# Patient Record
Sex: Female | Born: 1945 | Race: White | Hispanic: No | Smoking: Never smoker
Health system: Southern US, Community
[De-identification: ages and names within clinical notes are randomized; demographics above are authoritative.]

## PROBLEM LIST (undated history)

## (undated) DIAGNOSIS — E119 Type 2 diabetes mellitus without complications: Secondary | ICD-10-CM

## (undated) DIAGNOSIS — F32A Depression, unspecified: Secondary | ICD-10-CM

## (undated) DIAGNOSIS — E7800 Pure hypercholesterolemia, unspecified: Secondary | ICD-10-CM

## (undated) DIAGNOSIS — M109 Gout, unspecified: Secondary | ICD-10-CM

## (undated) DIAGNOSIS — I10 Essential (primary) hypertension: Secondary | ICD-10-CM

## (undated) DIAGNOSIS — F329 Major depressive disorder, single episode, unspecified: Secondary | ICD-10-CM

## (undated) HISTORY — PX: CHOLECYSTECTOMY: SHX55

## (undated) HISTORY — PX: REPLACEMENT TOTAL KNEE: SUR1224

## (undated) HISTORY — PX: CARPAL TUNNEL RELEASE: SHX101

---

## 1999-05-20 ENCOUNTER — Ambulatory Visit (HOSPITAL_BASED_OUTPATIENT_CLINIC_OR_DEPARTMENT_OTHER): Admission: RE | Admit: 1999-05-20 | Discharge: 1999-05-20 | Payer: Self-pay | Admitting: Orthopedic Surgery

## 2000-09-09 ENCOUNTER — Other Ambulatory Visit: Admission: RE | Admit: 2000-09-09 | Discharge: 2000-09-09 | Payer: Self-pay | Admitting: Internal Medicine

## 2001-09-27 ENCOUNTER — Encounter: Payer: Self-pay | Admitting: Internal Medicine

## 2001-09-27 ENCOUNTER — Ambulatory Visit (HOSPITAL_COMMUNITY): Admission: RE | Admit: 2001-09-27 | Discharge: 2001-09-27 | Payer: Self-pay | Admitting: Internal Medicine

## 2002-01-11 ENCOUNTER — Emergency Department (HOSPITAL_COMMUNITY): Admission: EM | Admit: 2002-01-11 | Discharge: 2002-01-11 | Payer: Self-pay | Admitting: *Deleted

## 2002-01-11 ENCOUNTER — Encounter: Payer: Self-pay | Admitting: *Deleted

## 2002-09-06 ENCOUNTER — Encounter: Payer: Self-pay | Admitting: Orthopedic Surgery

## 2002-09-10 ENCOUNTER — Inpatient Hospital Stay (HOSPITAL_COMMUNITY): Admission: RE | Admit: 2002-09-10 | Discharge: 2002-09-14 | Payer: Self-pay | Admitting: Orthopedic Surgery

## 2003-06-24 ENCOUNTER — Ambulatory Visit (HOSPITAL_COMMUNITY): Admission: RE | Admit: 2003-06-24 | Discharge: 2003-06-24 | Payer: Self-pay | Admitting: Internal Medicine

## 2003-12-12 ENCOUNTER — Emergency Department (HOSPITAL_COMMUNITY): Admission: EM | Admit: 2003-12-12 | Discharge: 2003-12-13 | Payer: Self-pay | Admitting: Emergency Medicine

## 2004-12-18 ENCOUNTER — Ambulatory Visit (HOSPITAL_COMMUNITY): Admission: RE | Admit: 2004-12-18 | Discharge: 2004-12-18 | Payer: Self-pay | Admitting: *Deleted

## 2005-07-15 ENCOUNTER — Ambulatory Visit: Payer: Self-pay | Admitting: Internal Medicine

## 2005-07-26 ENCOUNTER — Ambulatory Visit: Payer: Self-pay | Admitting: Internal Medicine

## 2005-07-26 ENCOUNTER — Ambulatory Visit (HOSPITAL_COMMUNITY): Admission: RE | Admit: 2005-07-26 | Discharge: 2005-07-26 | Payer: Self-pay | Admitting: Internal Medicine

## 2005-07-31 ENCOUNTER — Emergency Department (HOSPITAL_COMMUNITY): Admission: EM | Admit: 2005-07-31 | Discharge: 2005-07-31 | Payer: Self-pay | Admitting: Emergency Medicine

## 2005-08-06 ENCOUNTER — Ambulatory Visit (HOSPITAL_COMMUNITY): Admission: RE | Admit: 2005-08-06 | Discharge: 2005-08-06 | Payer: Self-pay | Admitting: Internal Medicine

## 2005-08-30 ENCOUNTER — Ambulatory Visit: Payer: Self-pay | Admitting: Internal Medicine

## 2005-09-14 ENCOUNTER — Ambulatory Visit: Payer: Self-pay | Admitting: Internal Medicine

## 2005-09-29 ENCOUNTER — Encounter (INDEPENDENT_AMBULATORY_CARE_PROVIDER_SITE_OTHER): Payer: Self-pay | Admitting: Specialist

## 2005-09-29 ENCOUNTER — Ambulatory Visit (HOSPITAL_COMMUNITY): Admission: RE | Admit: 2005-09-29 | Discharge: 2005-09-29 | Payer: Self-pay | Admitting: Internal Medicine

## 2005-09-29 ENCOUNTER — Ambulatory Visit: Payer: Self-pay | Admitting: Internal Medicine

## 2005-10-18 ENCOUNTER — Ambulatory Visit (HOSPITAL_COMMUNITY): Admission: RE | Admit: 2005-10-18 | Discharge: 2005-10-18 | Payer: Self-pay | Admitting: Internal Medicine

## 2005-12-21 ENCOUNTER — Ambulatory Visit (HOSPITAL_COMMUNITY): Admission: RE | Admit: 2005-12-21 | Discharge: 2005-12-21 | Payer: Self-pay | Admitting: *Deleted

## 2006-08-29 ENCOUNTER — Ambulatory Visit (HOSPITAL_COMMUNITY): Admission: RE | Admit: 2006-08-29 | Discharge: 2006-08-29 | Payer: Self-pay | Admitting: Internal Medicine

## 2007-05-10 ENCOUNTER — Inpatient Hospital Stay (HOSPITAL_COMMUNITY): Admission: RE | Admit: 2007-05-10 | Discharge: 2007-05-14 | Payer: Self-pay | Admitting: Orthopedic Surgery

## 2007-06-05 ENCOUNTER — Encounter (HOSPITAL_COMMUNITY): Admission: RE | Admit: 2007-06-05 | Discharge: 2007-07-05 | Payer: Self-pay | Admitting: Orthopedic Surgery

## 2007-07-06 ENCOUNTER — Encounter (HOSPITAL_COMMUNITY): Admission: RE | Admit: 2007-07-06 | Discharge: 2007-08-05 | Payer: Self-pay | Admitting: Orthopedic Surgery

## 2007-07-09 ENCOUNTER — Emergency Department (HOSPITAL_COMMUNITY): Admission: EM | Admit: 2007-07-09 | Discharge: 2007-07-09 | Payer: Self-pay | Admitting: Emergency Medicine

## 2007-07-10 ENCOUNTER — Other Ambulatory Visit: Payer: Self-pay | Admitting: Orthopedic Surgery

## 2007-07-11 ENCOUNTER — Inpatient Hospital Stay (HOSPITAL_COMMUNITY): Admission: RE | Admit: 2007-07-11 | Discharge: 2007-07-14 | Payer: Self-pay | Admitting: Orthopedic Surgery

## 2008-05-20 ENCOUNTER — Ambulatory Visit (HOSPITAL_COMMUNITY): Admission: RE | Admit: 2008-05-20 | Discharge: 2008-05-20 | Payer: Self-pay | Admitting: Internal Medicine

## 2008-06-17 ENCOUNTER — Ambulatory Visit: Payer: Self-pay | Admitting: Gastroenterology

## 2008-06-18 ENCOUNTER — Encounter: Payer: Self-pay | Admitting: Gastroenterology

## 2008-06-28 ENCOUNTER — Ambulatory Visit: Payer: Self-pay | Admitting: Internal Medicine

## 2008-06-28 ENCOUNTER — Ambulatory Visit (HOSPITAL_COMMUNITY): Admission: RE | Admit: 2008-06-28 | Discharge: 2008-06-28 | Payer: Self-pay | Admitting: Internal Medicine

## 2008-06-28 ENCOUNTER — Encounter: Payer: Self-pay | Admitting: Internal Medicine

## 2008-07-04 ENCOUNTER — Telehealth (INDEPENDENT_AMBULATORY_CARE_PROVIDER_SITE_OTHER): Payer: Self-pay

## 2008-07-04 ENCOUNTER — Ambulatory Visit (HOSPITAL_COMMUNITY): Admission: RE | Admit: 2008-07-04 | Discharge: 2008-07-04 | Payer: Self-pay | Admitting: Internal Medicine

## 2008-07-05 ENCOUNTER — Encounter: Payer: Self-pay | Admitting: Internal Medicine

## 2008-07-09 ENCOUNTER — Encounter: Payer: Self-pay | Admitting: Urgent Care

## 2008-07-11 ENCOUNTER — Encounter: Payer: Self-pay | Admitting: Internal Medicine

## 2008-07-11 ENCOUNTER — Encounter (INDEPENDENT_AMBULATORY_CARE_PROVIDER_SITE_OTHER): Payer: Self-pay | Admitting: *Deleted

## 2008-07-23 ENCOUNTER — Encounter: Payer: Self-pay | Admitting: Gastroenterology

## 2008-08-29 ENCOUNTER — Telehealth (INDEPENDENT_AMBULATORY_CARE_PROVIDER_SITE_OTHER): Payer: Self-pay

## 2009-04-07 ENCOUNTER — Encounter: Payer: Self-pay | Admitting: Gastroenterology

## 2010-05-17 ENCOUNTER — Encounter: Payer: Self-pay | Admitting: Internal Medicine

## 2010-08-06 LAB — CREATININE, SERUM
Creatinine, Ser: 1 mg/dL (ref 0.4–1.2)
GFR calc Af Amer: >60 mL/min (ref >60)
GFR calc non Af Amer: 56 mL/min — ABNORMAL LOW (ref >60)

## 2010-08-11 ENCOUNTER — Encounter: Payer: Self-pay | Admitting: Internal Medicine

## 2010-09-08 NOTE — Op Note (Signed)
Alexandra [name], Alexandra [name]               ACCOUNT NO.:  [id number]   MEDICAL RECORD NO.:  [id number]          PATIENT TYPE:  AMB   LOCATION:  DAY                           FACILITY:  [facility]   PHYSICIAN:  R. Roetta Sessions, M.D. DATE OF BIRTH:  Jan 31, 1946   DATE OF PROCEDURE:  06/28/2008  DATE OF DISCHARGE:                                PROCEDURE NOTE   PROCEDURE PERFORMED:  Esophagogastroduodenoscopy, biopsy.   INDICATIONS FOR PROCEDURE:  This 65 year-old lady with chronic  hoarseness and cough who has not had an ENT evaluation, recently  underwent a barium pill esophagram ordered by Dr. Sherwood Gambler which revealed  some extrinsic compression on the posterior aspect of the cervix  esophageal level of C4, C5- C6, C6-C7 and some asymmetrical appearance  of the impression.  On the upper area of the cervical esophagus at the  level of the left piriform sinus, radiology could not rule out mass  effect or some other process.  [name] denies any dysphagia  whatsoever.  EGD is now being done to evaluate the mucosal side of her  upper GI tract.  Risks, benefits and alternatives with the patient has  been reviewed and questions answered. See documentation in the medical  record under procedure note.   O2 saturation, blood pressure, pulse, respirations were monitored  throughout the entire procedure.   CONSCIOUS SEDATION:  Versed 5 mg IV, Demerol 100 mg IV in divided doses.   INSTRUMENT:  Pentax video chip system.   FINDINGS:  Examination of the hypopharynx revealed normal appearing  piriformis sinuses.  I did not see any mass or other abnormality.  Upper  esophageal sphincter was easily intubated.  There was some extrinsic  compression of proximal esophagus.  Mucosa however appeared normal and  this was a nonobstructing finding.  The remainder of the esophageal  mucosa down through EG junction appeared normal.   Stomach, colon, gastric cavity:  Appeared empty and inflated well with  air.  A thorough  examination of gastric mucosa including retroflexion of  proximal stomach, esophagogastric junction demonstrated multiple 1 to 2  mm tiny gastric nodules.  There was one larger one approximately 9 mm in  dimension  Somewhere that seen on prior EGD; please see photos.  This  appeared to be a benign finding.  Remainder of gastric mucosa appeared  unremarkable.  Pylorus patent.  Traversed duodenal bulb and second  portion revealed no abnormalities.   THERAPY/ DIAGNOSTIC MANEUVERS PERFORMED:  The large nodule along the  lesser curvature, the antrum was biopsied for histologic study.   The patient tolerated the procedure well and was reactive on endoscopy.   IMPRESSION:  Extrinsic compression on cervical esophagus consistent with  barium pill esophagram findings of doubtful clinical significance.  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  Remainder of stomach  appeared normal.  Normal D1 and D2.   RECOMMENDATIONS:  [name] hoarseness and cough are more likely  primary ENT/ pulmonary problem than any gastrointestinal process.  Will  go ahead and do CT scan  of neck and upper chest to rule out any other  process beyond osteophytes producing a benign impression on the cervical  esophagus.  Depending on what is found, I feel it would be a good idea  for this nice lady to go ahead and get a otolaryngological consultation  in the near future.      Jonathon Bellows, M.D.  Electronically Signed     RMR/MEDQ  D:  06/28/2008  T:  06/28/2008  Job:  [id number]   cc:   Madelin Rear. Sherwood Gambler, MD  Fax: [phone]

## 2010-09-08 NOTE — Op Note (Signed)
Alexandra [name], Alexandra [name]               ACCOUNT NO.:  [id number]   MEDICAL RECORD NO.:  [id number]          PATIENT TYPE:  INP   LOCATION:  5016                         FACILITY:  [facility]   PHYSICIAN:  Harvie Junior, M.D.   DATE OF BIRTH:  [birth year]   DATE OF PROCEDURE:  05/10/2007  DATE OF DISCHARGE:  05/14/2007                               OPERATIVE REPORT   PREOPERATIVE DIAGNOSIS:  End-stage degenerative joint disease of the  left knee.   PROCEDURES:  1. Left total knee replacement with Sigma system, size 3 femur, size 3      tibia, 15-mm bridging bearing and a 35-mm all-polyethylene patella.  2. Computer-assisted left total knee replacement.   SURGEON:  Harvie Junior, M.D.   ASSISTANT:  Marshia Ly, P.A.   ANESTHESIA:  General.   BRIEF HISTORY:  Alexandra [name] is a 65 year old female with a long  history of having had severe end-stage degenerative joint disease of the  left knee.  We treated her conservatively for a long period time.  We  treated anti-inflammatory medication and activity modification and  because of failure of all these, she is brought to the operating room  for a left total knee replacement.  Given her young age and large size,  I felt that it is appropriate to use computer assistance trying to get  as much longevity for her implant as we could, and this was chosen to be  used preoperatively and was brought to the operating room with the  patient.   PROCEDURE:  The patient was brought to the operating room and after  adequate anesthesia was obtained with a general anesthetic, the patient  was placed supine on the operating table and the left leg was prepped  and draped in the usual sterile fashion.  Following this a midline  incision was made through subcutaneous tissues and taken down to the  level of the extensor mechanism.  A medial parapatellar arthrotomy was  undertaken and attention was then turned towards the knee, where an  anterior and posterior  cruciates were excised as well as medial and  lateral meniscus and retropatellar fat pad.  At this point the computer-  assisted module put in place, two pins in the tibia, two pins in the  femur, and the registration process was then undertaken and adds about  20 minutes to the surgical procedure.  At this point the tibia was cut  perpendicular to the long axis, the femur cut perpendicular to the  anatomic axis and then spacer blocks were used to assess the gap.  At  this point attention was turned towards the sizing of the femur, which  was sized with the anterior referencing sizing guide and ultimately the  size 3 was chosen as the appropriate size and the rotational guide was  then used with a combination of computer assistance and rotation via the  anterior referencing guide.  Once this was completed, a size 3 femur was  chosen and the anterior and posterior cuts were made as well as the  chamfers.  Attention was turned the tibia, which  was sized and drilled  and keeled with a size 3.  Once this was completed, a 15-mm bridging  bearing was put in place and excellent full extension with the bearing.  Once this was completed, attention was turned to the patella.  It sized  to a 35 and after the patella was cut with a patellar sizing guide, then  the attention was turned towards putting in this patella and once this  was chosen, a 35 was chosen and lugs were drilled for the patella.  Lugs  was drilled for the femur and all the trial components were removed.  The knee was copiously and thoroughly lavaged and suctioned dry.  The  final components were then cemented into place, size 3 femur, size  3tibia, a 15-mm bridging bearing was used as a trial and a 35 mm patella  was put in place.  Once this was completed, the cement was allowed to  harden.  We checked our long alignment with the computer, perfect  neutral long alignment, gap balances were perfect.  At this point once  the cement  hardened, the trial poly was removed, the tourniquet let  down, all bleeders were controlled with electrocautery.  The final was  put in place after all excess bone cement was removed with a tool and  the knee was again irrigated and suctioned dry.  A medium Hemovac drain  was placed and the medial parapatellar arthrotomy was closed with a #1  Vicryl running.  __________ , where she was noted to be in satisfactory  condition.  Estimated blood loss for the procedure was less than 100 mL.      Harvie Junior, M.D.  Electronically Signed     JLG/MEDQ  D:  07/05/2007  T:  07/06/2007  Job:  [id number]

## 2010-09-08 NOTE — Op Note (Signed)
Alexandra [name], Alexandra [name]               ACCOUNT NO.:  [id number]   MEDICAL RECORD NO.:  [id number]          PATIENT TYPE:  OIB   LOCATION:  2550                         FACILITY:  [facility]   PHYSICIAN:  Feliberto Gottron. Turner Daniels, M.D.   DATE OF BIRTH:  [birth year]   DATE OF PROCEDURE:  07/11/2007  DATE OF DISCHARGE:                               OPERATIVE REPORT   PREOPERATIVE DIAGNOSIS:  Left total knee inferior pole patella fracture,  patella implant stable.   POSTOPERATIVE DIAGNOSIS:  Left total knee inferior pole patella  fracture, patella implant stable.   PROCEDURE:  Open reduction internal fixation of left total knee inferior  pole patella fracture using two #5 FiberWire core sutures with a running  whip stitch going through the patellar tendon up through 4 parallel  drill holes through the patella and then a running tied over the patella  and then a running whip stitch into the quad tendon.  Again, there were  4 core sutures.   SURGEON:  Feliberto Gottron.  Turner Daniels, MD.   ASSISTANT:  Marshia Ly, PA-C.   ANESTHETIC:  Endotracheal.   ESTIMATED BLOOD LOSS:  100 mL.   FLUID REPLACEMENT:  A liter of crystalloid.   DRAINS PLACED:  Foley catheter and two medium Hemovacs.   URINE OUTPUT:  Was about 800 mL.   INDICATIONS FOR PROCEDURE:  A 65 year old woman who underwent a  successful cemented left total knee by Dr. Luiz Blare back in January 2009  slipped and fell in her yard and sustained a displaced inferior pole  patella fracture of her left total knee.  There is also evidence of  comminution in the inferior pole on the plain x-rays and there was a  concern whether this would be strong enough to handle the parallel  wires.  In addition, the patellar implant looked to be well-fixed to the  remainder of the patella.  She is taken for open reduction internal  fixation of the tendon avulsion and is aware that failure rates for this  procedure range anywhere from 15-50% based on studies that have been  done in the last 10 years but without surgery she will no quadriceps  function in the left leg, therefore,  the risks are probably justified.   DESCRIPTION OF PROCEDURE:  The patient identified by armband, taken to  the operating room at [facility].  The appropriate monitors were  attached and general endotracheal anesthesia induced with the patient in  the supine position.  She received 2 grams of Ancef preoperatively.  A  tourniquet was applied high to the left thigh and the left lower  extremity was prepped and draped in usual sterile fashion from the toes  to the tourniquet.  A time out was performed.  The limb was wrapped with  an Esmarch bandage, tourniquet inflated to 350 mmHg, and we utilized the  old total knee incision going from proximal and distal through the skin  and just into the subcutaneous tissue, where we immediately encountered  a pressurized hematoma.  This was drained and then we set about removing  the fascia from the  superior and inferior ends of the fracture.  The  superior fragment was 1 piece and had the patellar button in it and at  the very inferior pole the button is where the inferior pole broken had  off and portion was a broken into more than 5 or 5 pieces.  Because of  this turn of events, we elected to not to do parallel wires with a  tension band and elected to instead to do a whip stitch repair going up  and down the patellar tendon with two #5 FiberWire core sutures for 4  limbs.  We then drilled parallel holes just anterior to the patellar  button implant through the patella and brought the #5 FiberWires out  through the parallel vertical holes.  Each pair was then tied at the  exit and then run up into the quad tendon as a whip stitch and tied  again.  This gave [place] good firm fixation.  The knee could be flexed from  0 to about 60 degrees without any motion noticed at the fracture site.  We then repaired the retinaculum with a running #1  Vicryl suture going  horizontally from lateral to medial and placed a medium Hemovac drain  deep in the wound and another one superficially.  Prior to this, the  wounds were irrigated out with normal saline solution and pulse lavage.  The subcutaneous tissue was then closed with 0 and 2-0 undyed Vicryl  suture and the skin with skin staples.  A dressing of Xeroform, 4 x 4  dressing sponges, Webril, an Ace wrap and knee immobilizer was then  applied and the plan is to keep her in the knee immobilizer for 6-12  weeks, depending on what the postoperative radiographs look like in  series.  The tourniquet was let down.  The patient was then awakened and  taken to the recovery room without difficulty.      Feliberto Gottron. Turner Daniels, M.D.  Electronically Signed     FJR/MEDQ  D:  07/11/2007  T:  07/11/2007  Job:  [id number]   cc:   Harvie Junior, M.D.

## 2010-09-08 NOTE — Op Note (Signed)
[name], [name]               ACCOUNT NO.:  [id number]   MEDICAL RECORD NO.:  [id number]          PATIENT TYPE:  INP   LOCATION:  5016                         FACILITY:  [facility]   PHYSICIAN:  Harvie Junior, M.D.   DATE OF BIRTH:  [birth year]   DATE OF PROCEDURE:  05/10/2007  DATE OF DISCHARGE:                               OPERATIVE REPORT   PREOPERATIVE DIAGNOSIS:  End-stage degenerative joint disease left.   POSTOPERATIVE DIAGNOSIS:  End-stage degenerative joint disease left.   PRINCIPAL PROCEDURE:  1. Left total knee replacement with Sigma system size 3 femur, size 3      tibia, 12.5 mm bridging bearing, 35 mm all polyethylene patella.  2. Computer assisted left total knee replacement.   SURGEON:  Harvie Junior, M.D.   ASSISTANT:  Marshia Ly, P.A.   ANESTHESIA:  General.   HISTORY:  Alexandra [name] is a 65 year old female with a long history of  having had significant bilateral joint disease.  We had done her right  knee about 4 years ago.  She had tolerated for a while left knee pain.  We treated her conservatively with anti-inflammatory medication,  activity modification and injection therapy.  All this failed and  because of continuing complaints of pain she was brought to the  operating room for operative knee replacement.  On her right knee she  had significant postoperative pain and difficult time gaining flexion.  She had some pain postoperatively and it was felt that it was  appropriate to use computer assistance because of her large size, young  age and difficulty that she had had on the other side and so this was  chosen to be used preoperatively.   PROCEDURE IN DETAIL:  The patient was taken to the operating room.  After adequate anesthesia was obtained with general anesthetic the  patient was placed on the operating table.  The left leg was prepped and  draped in usual sterile fashion.  Following this a midline incision was  made and taken through  subcutaneous tissues and dissected down to the  level of the extensor mechanism.  Medial peripatellar arthrotomy was  undertaken.  Medial and lateral meniscectomy, anterior and posterior  cruciate excisions were performed at that point.  At this point 2 pins  were placed in the tibia, 2 pins in the femur and the computer  assistance module was set in place and the registration process then  undertaken and this added about 20 minutes to the surgical procedure.  Once this was completed the tibia was cut perpendicular to the long  axis.  Given her large flexion contracture preoperatively we did take 9  mm off the high side.  Once this was completed attention was turned to  the distal femur where we cut 10 mm off of the distal femur and this  gave excellent balance to the knee alignment with a 15 mm shim being put  in place.  At this point we put the posterior referencing sizing guide  in place and this gave access to a size 3 femur and once this was  completed  we felt that, well actually sized to a size 4 but we felt with  the 15 extension gap that we were going to need to downsize to a 3 to  provide enough flexion gap to match the extension gap.  At this point I  downsized the femur to a 3 which actually was probably a better fit than  a 4 in the medial lateral plane.  Once the holes were drilled for this  it was checked with the computer assistance module and then the anterior  and posterior cuts were made as well as the chamfers.  At this point the  box cut was made.   At this point attention was turned to the tibia which was sized to a 3.  It was pinned and then rotational alignment was obtained and then the  12.5 mm bridging bearing was put in place.  Easy full extension, good  gap balance, 90 degrees with a little bit of a drawer.  Tried a 15 and  that actually came out nicely into full extension.  The drawer was  relieved and really had perfect gap balancing at 90 degrees of  flexion,  had perfect gap balancing at full extension.  So we felt that this was  the appropriate choice.   Attention was turned to the patella which was cut down to a level of 13  and a 35 mm chosen.  Lugs were drilled.  Lugs were drilled for the  femur.  All trial components were removed.  The knee was copiously and  thoroughly irrigated with pulsatile lavage irrigation followed by final  components being cemented into place after drying the interstices of the  cement.  Once the size 3 was cemented in place a size 3 tibial component  and a 12.5 mm bridging bearing was used to allow the cement to dry and  then once this was completed the patella was put in and a patella clamp  was put in place.  Once the cement was allowed to harden a medium  Hemovac drain was put in place and the trial poly was removed.  The  tourniquet was let down.  All bleeders were controlled with  electrocautery.  The 12.5 and 15 were both trialed at this point.  Really felt like we had a little better stability with an anterior  drawer with a 15 in flexion and certainly in extension it could easily  take a 15, so I went to a 15 mm poly and once that was put in place easy  full extension, easy good stability in mid flexion and full extension.  At this point the gap balances were checked and felt to be perfect in  extension and perfect neutral long alignment and perfect in flexion.  So  at this point the knee was copiously and thoroughly irrigated and  suctioned dry.  Medial peripatellar arthrotomy was put in place, medial  peripatellar arthrotomy was closed with 1 Vicryl running suture.  The  skin with 0 and 2-0 Vicryl and skin with skin staples.  Sterile  compressive dressing was applied and the patient was taken to recovery  where she was noted to be in satisfactory condition.  Estimated blood  loss for the procedure was less than 100 mL.      Harvie Junior, M.D.  Electronically Signed     JLG/MEDQ  D:   05/10/2007  T:  05/10/2007  Job:  [id number]

## 2010-09-08 NOTE — Consult Note (Signed)
NAMECARLETA, WOODROW               ACCOUNT NO.:  [id number]   MEDICAL RECORD NO.:  [id number]          PATIENT TYPE:  AMB   LOCATION:  DAY                           FACILITY:  [facility]   PHYSICIAN:  Kassie Mends, M.D.      DATE OF BIRTH:  30-Sep-1945   DATE OF CONSULTATION:  06/17/2008  DATE OF DISCHARGE:                                 CONSULTATION   CHIEF COMPLAINT:  Abnormal barium esophagram.   REQUESTING CONSULTATION:  Madelin Rear. Sherwood Gambler, MD   HISTORY OF PRESENT ILLNESS:  The patient is a 65 year old lady who  presents today for further evaluation of above-stated findings.  The  patient states that she recently saw Dr. Sherwood Gambler for routine visit.  She  has a history of intermittent chronic cough.  She states she has been  having a cough for years.  She was a heavy smoker, but quit about 10  years ago.  She states with past 1 year, she has had never cough up  phlegm.  Initially, she noted some improvement when more of her blood  pressure medicines have been changed.  Because her chronic cough as well  as hoarseness and concern for laryngeal reflux, Dr. Sherwood Gambler had ordered  barium esophagram in January 2010.  On the study, she had several small  anterior osteophytes with a slight impression upon the posterior aspect  of the cervical esophagus, levels C4-C5, C5-C6, and C6-C7.  There was  also asymmetric appearance with impression upon the left aspect of the  upper cervical esophagus with level of the left piriform sinus, which  underfilled the contrast when compared to the right.  This finding  persisted with repetitive imaging.  It was felt that this could be a  normal finding, but underlying mass need to be excluded.  A 13-mm barium  tablet passed without any difficulty.  There was no evidence of reflux  during the exam.  For these findings, she has been sent for further  evaluation.  She denies any typical heartburn symptoms.  She states at  one point she took Aciphex 20 mg daily for a  year, but did not really  notice any improvement in her cough or hoarseness.  She denies any  abdominal pain.  Her bowel movements are regular.  No blood in the stool  or melena.  She does have a history of iron deficiency anemia and we saw  her in the past for this.  Please see below for details.  She states  recently she tried to give blood and was denied because her hemoglobin  was 10.9.   CURRENT MEDICATIONS:  1. HCTZ 50 mg daily.  2. Evista 60 mg daily.  3. Lopressor 50 mg b.i.d.  4. Lamictal 200 mg daily.  5. Benicar 40 mg daily.  6. Fluoxetine 20 mg daily.  7. Pravastatin 20 mg nightly.  8. Nitro 0.4 mg p.r.n.  9. She takes amoxicillin prior to dental procedures.  Given her      history of knee replacement and was told to do this for 2 years by  her dental hygienist.  10.Xanax 0.5 mg two to three q. week p.r.n.   ALLERGIES:  CODEINE.   PAST MEDICAL HISTORY:  1. Hypertension.  2. Anxiety.  3. History of heart murmur.  4. Hypercholesterolemia.  5. Arthritis.  6. Depression.  7. History of iron deficiency anemia.  8. Heme-positive stools, underwent evaluation in 2007.  9. She had a colonoscopy by Dr. 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  10.EGD with a small bowel biopsy showed a gastric nodule which was      hyperplastic fundic gland mammillation.  Small bowel biopsies were      negative for celiac disease.  She had a Givens capsule endoscopy,      which was normal.  11.She had a benign left breast biopsy, a tubal ligation,      cholecystectomy, and carpal tunnel repair bilaterally.  Bilateral      knee replacement in the left, the last one which was the left, was      in January 2009.  12.History of adenomatous polyps.  She had a colonoscopy in 1985, had      1.5 cm adenomatous polyps.  She had a 0.7 cm adenomatous polyp in      1995.  No polyps seen in 2000-1007.  She does have a history of       diverticular disease.   FAMILY HISTORY:  Father deceased at age 67 due to a blood clot.  Mother  is an 23 years old who has a history of CAD and a hole in her heart.  No family history of colorectal cancer, chronic liver disease, or GI  disease.   SOCIAL HISTORY:  She is married, has two children.  She employed at  [employer] in [place].  She quit smoking about 10 years ago and  occasionally consumes alcoholic beverages.   REVIEW OF SYSTEMS:  See HPI for GI.  CONSTITUTIONAL:  She denies any  weight loss.  CARDIOPULMONARY:  Denies any chest pain, see HPI for  cough.  Denies any shortness of breath.  GENITOURINARY:  No dysuria or  hematuria.   PHYSICAL EXAMINATION:  VITALS SIGNS:  Weight 233, height 5 feet 2  inches, temperature 98.2, blood pressure 108/68, and pulse 68.  GENERAL:  Pleasant, obese, Caucasian female in no acute distress.  SKIN:  Warm and dry.  No jaundice.  HEENT:  Sclerae nonicteric.  Oropharyngeal mucosa moist and pink.  No  lesions, erythema, or exudate.  NECK:  No lymphadenopathy or thyromegaly.  CHEST:  Lungs are clear auscultation.  CARDIAC:  Regular rate and rhythm.  Normal S1 and S2.  No murmurs, rubs,  or gallops.  ABDOMEN:  Positive bowel sounds.  Abdomen is obese, soft, nontender, and  nondistended.  No organomegaly or masses.  No rebound or guarding.  No  abdominal bruits or hernias.  LOWER EXTREMITIES:  No edema.   IMPRESSION:  Ms. Mahurin is a 65 year old lady with history of chronic  hoarseness and chronic cough, which was recently evaluated via barium  and pill esophagram.  She had an abnormality seen with a multiple small  anterior osteophytes with slight impression from the posterior aspect of  the cervical esophagus as well as asymmetric appearance with impression  within the left aspect of the upper cervical esophagus at the level of  the left piriform sinus, which was felt need to be a further evaluated  to exclude mass.  The patient denies any  typical heartburn symptoms.  Cannot exclude the possibility of laryngopharyngeal reflux.  She has  been untreated for this at this point.  We will need to review her  barium esophagram further by Dr. Jena Gauss prior to proceeding with further  workup.  She will likely need an EGD, plus or minus chest x-ray is the  next step.   PLAN:  1. We will discuss further with Dr. Jena Gauss, further recommendations to      follow.  2. Nexium 40 mg p.o. b.i.d. 30 minutes before meals, #30 samples      provided.  3. Further recommendations to follow.      Tana Coast, P.A.      Kassie Mends, M.D.  Electronically Signed    LL/MEDQ  D:  06/17/2008  T:  06/18/2008  Job:  [id number]   cc:   Madelin Rear. Sherwood Gambler, MD  Fax: [phone]   Kassie Mends, M.D.  [address]  [place] , [place] [address]

## 2010-09-08 NOTE — Discharge Summary (Signed)
Alexandra [name], Alexandra [name]               ACCOUNT NO.:  [id number]   MEDICAL RECORD NO.:  [id number]          PATIENT TYPE:  INP   LOCATION:  5016                         FACILITY:  [facility]   PHYSICIAN:  Harvie Junior, M.D.   DATE OF BIRTH:  09/11/1945   DATE OF ADMISSION:  05/10/2007  DATE OF DISCHARGE:  05/14/2007                               DISCHARGE SUMMARY   ADMISSION DIAGNOSES:  1. End-stage degenerative joint disease, left knee.  2. Hypertension.  3. Gouty arthritis.  4. Gastroesophageal reflux disease.  5. Osteoporosis.   DISCHARGE DIAGNOSES:  1. End-stage degenerative joint disease, left knee.  2. Hypertension.  3. Gouty arthritis.  4. Gastroesophageal reflux disease.  5. Osteoporosis.   PROCEDURES IN THE HOSPITAL:  Left total knee arthroplasty, computer-  assisted, Jodi Geralds, M.D.,  May 10, 2007.   BRIEF HISTORY:  Alexandra [name] is a 65 year old female with a long history of  left knee pain.  Her standing x-rays showed that she had bone-on-bone  degenerative arthritis.  She has night pain and pain with ambulation.  She has gotten only temporary relief with exhaustive conservative  treatment, including injection therapy, use of medication and  modification of her activity.  Based upon her clinical and radiographic  findings, she was felt to be a candidate for a left total knee  arthroplasty and she was  admitted for this.   PERTINENT LABORATORY STUDIES:  The patient's EKG on May 08, 2007,  showed normal sinus rhythm with right ventricular conduction delay, new  since previous tracing.  Hemoglobin on admission was 11.8, hematocrit  35.8.  Indices within normal limits.  On postop day one, her hemoglobin  was 9.2, number two 9.4 and number three 9.8.  Pro time on admission was  11.8 seconds and INR was 0.9, PTT was 24.  On the day of discharge her  INR was 18.2 seconds with an INR of 1.5.  CMET on admission showed no  abnormalities other than slightly decreased albumin at  3.4.  BMET on  postop day #1 showed a sodium of 132, glucose of 131 but no other  abnormalities.  Urinalysis on admission showed no abnormalities.   HOSPITAL COURSE:  The patient was brought to the operating room, where  she was given Ancef and gentamicin preoperatively prophylactically.  She  underwent left knee replacement surgery as well described in Dr. Luiz Blare'  operative note.  She was put on the PCA morphine pump for pain control,  on IV fluids.  She was started on Coumadin for DVT prophylaxis.  Physical therapy was ordered for walker ambulation, weightbearing as  tolerated on the left.  On postop day #1, she had an episode of nausea  and vomiting but was then doing better.  She was  taking fluids without  difficulty.  Foley catheter that was placed at time of surgery was  intact.  Hemoglobin was 9.2.  BMET was within normal limits.  INR was  1.1 on Coumadin.  NV was intact to the left lower extremity.  She was  continued with physical therapy and incentive spirometry was encouraged  to  keep her lungs clear.  On postop day #2 she had moderate knee pain.  Her left leg dressing was changed and her Hemovac was pulled.  Her  hemoglobin was stable and her INR was up to 1.3 on Coumadin.  She  continued to progress with physical therapy.  She made good progress  overall.  Hemoglobin was 9.8 on postop day #3.  Vital signs were stable.  On May 14, 2007, the patient was overall doing well.  She was ready  to go home.  She was taking fluids and voiding without difficulty.  She  was doing well with physical therapy.  Her vital signs were stable.  She  was  afebrile.  Her left knee incision was clean and dry.  The IV which  had been converted to a saline lock 2 days previously was removed.  IV  saline lock was removed.  She was discharged home in improved condition.  She will be on her usual home medications as well as the addition of  Phenergan 12.5-mg tablets 1 q.6h. p.r.n. nausea,  Percocet 5 mg 1 to 2  q.6 h p.r.n. pain, Robaxin 500 mg q.8 h p.r.n. spasm, and Coumadin 12.5  mg daily until directed otherwise.  She is going to need home health  physical therapy for walker ambulation, weightbearing as tolerated on  the left, home CPM for knee range of motion and home health RN for pro  times and Coumadin management x1 month postop.  She will follow up with  Dr. Luiz Blare in the office in 10 days, sooner if any problems occur.      Marshia Ly, P.A.      Harvie Junior, M.D.  Electronically Signed    JB/MEDQ  D:  07/05/2007  T:  07/06/2007  Job:  [id number]   cc:   Madelin Rear. Sherwood Gambler, MD  Dani Gobble, MD

## 2010-09-11 NOTE — H&P (Signed)
[name], [name]               ACCOUNT NO.:  [id number]   MEDICAL RECORD NO.:  [id number]           PATIENT TYPE:   LOCATION:                                 FACILITY:   PHYSICIAN:  R. Roetta Sessions, M.D. DATE OF BIRTH:  1945-05-16   DATE OF ADMISSION:  DATE OF DISCHARGE:  LH                                HISTORY & PHYSICAL   CHIEF COMPLAINT:  Esophagogastroduodenoscopy with possible small-bowel  biopsy.   HISTORY OF PRESENT ILLNESS:  Alexandra [name] is a 65 year old who underwent  colonoscopy by Dr. Jena Gauss on July 26, 2005.  She was found to have an anal  papilla and internal hemorrhoids, left-sided diverticula and otherwise  normal exam.  She states she has had problems with frequent loose stools.  She has had up to four loose stools that were nonbloody but mucusy per day.  She has completed three Hemoccult cards which were negative.  She complains  of fatigue and some fecal incontinence as well.  She occasionally has  history of constipation that alternates with her bowel movements, although  recently she has had mostly diarrhea.  She has had chronic anemia for at  least two years.  She has a history of frequent aphthous ulcers.  She  describes her abdominal pain as more of a discomfort.  She describes it as  squirming.  Her hemoglobin was 9.9 with hematocrit of 30.3 and MCV of 68.5  on July 26, 2005, the day of her colonoscopy.  She has a history of  adenomatous polyp.  She has noticed occasional palpitations and some  shortness of breath on exertion.  She did have a cardiac evaluation less  than a year ago which was normal per her report.   CURRENT MEDICATIONS:  1.  Monopril once daily.  2.  Hydrochlorothiazide once daily.  3.  Evista once daily.  4.  Mobic once daily.  5.  Lopressor one b.i.d.  6.  Lamictal 200 mg daily for depression.   ALLERGIES:  CODEINE.   PAST MEDICAL HISTORY:  1.  Hypertension.  2.  Anxiety.  3.  Heart murmur.  4.  History of  hypercholesterolemia.  5.  Osteoarthritis.  6.  Depression.  7.  She had a left benign breast biopsy.  8.  Status post cholecystectomy.  9.  Carpal tunnel repair bilaterally.  10. Tubal ligation.  11. Right knee replacement.   FAMILY HISTORY:  Father deceased at age 72 due to blood clot.  Mother alive  at age 94 with coronary artery disease.  No known family history of  colorectal carcinoma, chronic liver or GI problems.   SOCIAL HISTORY:  Alexandra [name] is married.  She works for [employer].  She  denies any tobacco or drug use.  She rarely consumes alcoholic beverages.   REVIEW OF SYSTEMS:  CONSTITUTIONAL:  See HPI.  Her weight is stable.  Denies  any fever or chills.  CARDIOVASCULAR:  See HPI.  PULMONARY:  See HPI.  GI:  See HPI.  She denies any dysphagia or odynophagia.  Denies any early satiety  or anorexia.  PHYSICAL EXAMINATION:  GENERAL APPEARANCE:  Alexandra [name] is a 65 year old  Caucasian female who is alert and oriented, pleasant and cooperative, in no  acute distress.  VITAL SIGNS:  Weight 226.5 pounds, height 52 inches, temperature 97.6, blood  pressure 140/84, pulse 54.  HEENT:  Sclerae are clear and nonicteric.  Conjunctivae are pink.  Oropharynx pink and moist without lesions.  NECK:  Supple without masses or thyromegaly.  LUNGS:  Clear to auscultation bilaterally.  CARDIOVASCULAR:  She has a 2/6 murmur noted.  ABDOMEN:  Positive bowel sounds x4, no bruits auscultated.  Soft, nontender  and nondistended.  No palpable masses, no hepatosplenomegaly.  No rebound  tenderness or guarding.  She does have erythematous pustule in the  suprapubic area from ingrown hair.  RECTAL:  No external lesions visualized.  Good sphincter tone.  No internal  masses palpated.  She does have a small amount of medium brown stool  obtained from the vault which was Hemoccult positive.  EXTREMITIES:  Without clubbing or edema bilaterally.   IMPRESSION:  Alexandra [name] is a 65 year old Caucasian  female with iron-  deficiency anemia, most recent hemoglobin 9.9.  It continues to decline.  She was found to have Hemoccult positive stools.  She does note rectal  pruritus which I suspect is due to internal hemorrhoid, however, this has  not explained her gastrointestinal blood loss.  She is Hemoccult positive on  exam today, therefore we should proceed with esophagogastroduodenoscopy and  given her history of chronic diarrhea, possible small-bowel biopsy.   PLAN:  1.  Will schedule EGD plus/minus small-bowel biopsy with Dr. Jena Gauss in the      near future.  I have discussed the procedure including the risks and      benefits to include, but not limited to, bleeding, infection,      perforation, drug      reaction.  She agrees with consent being obtained.  2.  Anusol HC suppositories one per rectum b.i.d. x10 days #20 with no      refills.  3.  Further recommendation pending procedure.      Nicholas Lose, N.P.      Jonathon Bellows, M.D.  Electronically Signed    KC/MEDQ  D:  09/15/2005  T:  09/15/2005  Job:  [id number]   cc:   Madelin Rear. Sherwood Gambler, MD  Fax: [phone]

## 2010-09-11 NOTE — Discharge Summary (Signed)
NAMEFRANKYE, Alexandra [name]               ACCOUNT NO.:  [id number]   MEDICAL RECORD NO.:  [id number]          PATIENT TYPE:  INP   LOCATION:  5015                         FACILITY:  [facility]   PHYSICIAN:  Feliberto Gottron. Turner Daniels, M.D.   DATE OF BIRTH:  1946/03/08   DATE OF ADMISSION:  07/11/2007  DATE OF DISCHARGE:  07/14/2007                               DISCHARGE SUMMARY   FINAL DIAGNOSES:  Left knee inferior parapatellar fracture and an  otherwise stable total knee.   PROCEDURE WHILE IN HOSPITAL:  Open reduction and internal fixation, left  total knee inferior prepatellar fracture.   HISTORY OF PRESENT ILLNESS:  The patient is a 65 year old woman who  underwent a successful cemented left total knee arthroplasty by Dr. Jodi Geralds in January 2009.  She slipped and fell in her yard and sustained  a displaced inferior parapatellar fracture of the left total knee  recently with a combination of inferior parapatellar with plain x-rays,  there was some concern about whether or not this would be strong enough  to handle the parallel wire technique of fixation.  In addition, a  patellar implant looked to be well fixed to the remainder of the  patella.  She was brought to Dr. Turner Daniels for discussion of open reduction  and internal fixation of a tendon avulsion.  He made her aware that  failure rates of this procedure range anywhere from 15-50% based on the  surgery that has been done in the last 10 years.  Going out with  surgery, she will have no quadriceps function in her leg; therefore, she  wishes to proceed with the surgery.  Risks and benefits were discussed,  and she understands the nature of the procedure.   ALLERGIES:  CODEINE, which causes nausea.   MEDICATIONS:  At the time of admission, Lamictal, Prozac, Evista,  Benicar, Aciphex, hydrochlorothiazide, and metoprolol.   PAST MEDICAL HISTORY:  Positive for:  1. Hypertension.  2. Gout.   PAST SURGICAL HISTORY:  1. Cholecystectomy in 1990.  2. Bilateral carpal tunnel release in 2000.  3. Right total knee in 2003.  4. Left total knee in January 2009.   FAMILY HISTORY:  Noncontributory.   SOCIAL HISTORY:  The patient lives with her husband.  She does not use  tobacco and only occasional ethanol.   REVIEW OF SYSTEMS:  She has decreased hearing.  Denies any shortness of  breath, chest pain, or recent illness.   PHYSICAL EXAMINATION:  VITAL SIGNS:  Pulse of 80, blood pressure 128/79.  HEAD:  Normocephalic and atraumatic.  EARS:  TMs are clear.  EYES:  Pupils are equal to the light and accommodation.  NOSE:  Patent.  THROAT:  Benign.  NECK:  Supple.  Full range of motion.  CHEST:  Clear to auscultation and percussion.  HEART:  Regular rate and rhythm.  ABDOMEN:  Soft and nontender.  EXTREMITIES:  Tender, swollen left knee, unable to do straight leg  raise.  NEUROVASCULAR:  Intact distally.   LABORATORY DATA:  Preoperative labs including CBC, CMET, chest x-ray,  EKG, PT, and PTT are within  normal limits with the exception of WBC of  3.69, hemoglobin of 9.4, hematocrit of 28.5, glucose of 108, and a GFR  of 54.   HOSPITAL COURSE:  On the day of admission, the patient was taken to the  operating room where she underwent an open reduction and internal  fixation of the left total knee inferior parapatellar fracture using two  # 5 FiberWire core sutures with a running whip stitch running through  the patellar tendon through 4 parallel drill holes through the patella  and the running tied over left patella with a running whip stitch in the  quad tendon with 4 core sutures.  Medium Hemovac drain was placed upon  to the wound.  Foley catheter was placed perioperatively.  The patient  was placed on perioperative Dilaudid PCA for pain control as well as  Coumadin per pharmacy Protocol.  Postop day 1, the patient was  complaining of moderate pain.  She was afebrile.  Dressing was dry.  Hemovac was discontinued without  difficulty.  Calf was soft and  nontender.  She was otherwise stable, and she began mobilizing for  ambulation with physical therapy, start weight bearing as tolerated  while wearing the immobilizer, PCA was discontinued, IV was Hep locked.  Postoperative day 2, the patient was without complaints.  She had not  yet voided after removal of her Foley.  Positive flatus.  She was  afebrile.  Vital signs were stable.  Dressing was dry.  Calf was soft.  Otherwise, stable orthopedically.  She continued with physical therapy.  Postop day 3, the patient was without complaints.  PT goals were  progressing.  She was afebrile.  Vitals were stable.  Voiding without  difficulty.  Dressing was dry.  It was benign.  Calf was soft,  nontender.  She was otherwise stable orthopedically.  She was discharged  home after meeting her physical therapy goals.  She will have Home  Health PT.  She will return to clinic in 1 weeks' time.   DIET:  Regular.   ACTIVITIES:  Weightbearing as tolerated with immobilizer and walker.   MEDICATIONS AT THE TIME OF DISCHARGE:  Include;  1. Lamictal 100 mg.  2. Prozac 10 mg daily.  3. Evista 60 mg daily.  4. Benicar 40 mg daily.  5. Aciphex 20 mg daily.  6. Hydrochlorothiazide 50 mg daily.  7. Metoprolol 25 mg b.i.d.  8. Percocet for pain control.  9. Robaxin 500 mg 1 p.o. q.8 h. p.r.n. pain.  10.She was just given an aspirin b.i.d. with food for her      anticoagulation.      Laural Benes. Jannet Mantis.      Feliberto Gottron. Turner Daniels, M.D.  Electronically Signed    JBR/MEDQ  D:  08/16/2007  T:  08/17/2007  Job:  [id number]

## 2010-10-27 ENCOUNTER — Other Ambulatory Visit (HOSPITAL_COMMUNITY): Payer: Self-pay | Admitting: Internal Medicine

## 2010-10-27 ENCOUNTER — Ambulatory Visit (HOSPITAL_COMMUNITY)
Admission: RE | Admit: 2010-10-27 | Discharge: 2010-10-27 | Disposition: A | Discharge status: Home / Self Care | Payer: BC Managed Care – PPO | Source: Ambulatory Visit | Attending: Internal Medicine | Admitting: Internal Medicine

## 2010-10-27 DIAGNOSIS — Z1231 Encounter for screening mammogram for malignant neoplasm of breast: Secondary | ICD-10-CM | POA: Insufficient documentation

## 2010-10-27 DIAGNOSIS — Z139 Encounter for screening, unspecified: Secondary | ICD-10-CM

## 2011-01-14 LAB — COMPREHENSIVE METABOLIC PANEL
Albumin: 3.4 — ABNORMAL LOW
Alkaline Phosphatase: 88
CO2: 29
Creatinine, Ser: 0.93
Glucose, Bld: 98
Potassium: 4

## 2011-01-14 LAB — DIFFERENTIAL
Basophils Absolute: 0
Basophils Relative: 0
Eosinophils Absolute: 0.2
Lymphocytes Relative: 31
Lymphs Abs: 3
Monocytes Relative: 5
Neutro Abs: 5.9

## 2011-01-14 LAB — URINALYSIS, ROUTINE W REFLEX MICROSCOPIC
Ketones, ur: NEGATIVE
Nitrite: NEGATIVE
Protein, ur: NEGATIVE
Specific Gravity, Urine: 1.016
Urobilinogen, UA: 0.2
pH: 8

## 2011-01-14 LAB — CBC
Hemoglobin: 11.8 — ABNORMAL LOW
MCHC: 33.1
MCV: 80
Platelets: 470 — ABNORMAL HIGH
WBC: 9.6

## 2011-01-14 LAB — TYPE AND SCREEN: ABO/RH(D): O POS

## 2011-01-14 LAB — ABO/RH: ABO/RH(D): O POS

## 2011-01-14 LAB — PROTIME-INR: INR: 0.9

## 2011-01-15 LAB — CBC
MCHC: 33.5
MCV: 79.3
Platelets: 322
Platelets: 343
Platelets: 348
RBC: 3.45 — ABNORMAL LOW
RDW: 15.8 — ABNORMAL HIGH
WBC: 12.3 — ABNORMAL HIGH
WBC: 13.2 — ABNORMAL HIGH

## 2011-01-15 LAB — DIFFERENTIAL
Basophils Absolute: 0
Basophils Absolute: 0
Basophils Relative: 0
Basophils Relative: 2 — ABNORMAL HIGH
Eosinophils Absolute: 0
Eosinophils Absolute: 0.1
Eosinophils Relative: 0
Lymphocytes Relative: 20
Lymphs Abs: 2
Neutro Abs: 10 — ABNORMAL HIGH
Neutro Abs: 9.7 — ABNORMAL HIGH
Neutrophils Relative %: 70
Neutrophils Relative %: 73
Neutrophils Relative %: 75

## 2011-01-15 LAB — PROTIME-INR
INR: 1.3
INR: 1.5
Prothrombin Time: 14.1
Prothrombin Time: 16 — ABNORMAL HIGH
Prothrombin Time: 16.7 — ABNORMAL HIGH
Prothrombin Time: 18.2 — ABNORMAL HIGH

## 2011-01-15 LAB — BASIC METABOLIC PANEL
BUN: 12
GFR calc non Af Amer: >60
Sodium: 132 — ABNORMAL LOW

## 2011-01-18 LAB — CBC
HCT: 30.7 — ABNORMAL LOW
Hemoglobin: 8.8 — ABNORMAL LOW
Hemoglobin: 9.4 — ABNORMAL LOW
MCHC: 33.1
MCV: 77.7 — ABNORMAL LOW
Platelets: 438 — ABNORMAL HIGH
RBC: 3.42 — ABNORMAL LOW
RBC: 3.69 — ABNORMAL LOW
RDW: 15.7 — ABNORMAL HIGH
WBC: 10.3

## 2011-01-18 LAB — CROSSMATCH
ABO/RH(D): O POS
Antibody Screen: NEGATIVE

## 2011-01-18 LAB — BASIC METABOLIC PANEL
Calcium: 8.4
GFR calc Af Amer: >60
GFR calc non Af Amer: 54 — ABNORMAL LOW
Sodium: 136

## 2011-01-18 LAB — DIFFERENTIAL
Basophils Relative: 1
Lymphocytes Relative: 24
Monocytes Absolute: 1
Monocytes Relative: 8
Neutro Abs: 7.6

## 2011-01-18 LAB — APTT: aPTT: 25

## 2011-01-18 LAB — COMPREHENSIVE METABOLIC PANEL
Albumin: 3.3 — ABNORMAL LOW
Alkaline Phosphatase: 79
BUN: 16
Potassium: 3.4 — ABNORMAL LOW
Total Protein: 6.9

## 2013-04-04 ENCOUNTER — Other Ambulatory Visit (HOSPITAL_COMMUNITY): Payer: Self-pay | Admitting: Family Medicine

## 2013-04-04 DIAGNOSIS — Z139 Encounter for screening, unspecified: Secondary | ICD-10-CM

## 2013-04-06 ENCOUNTER — Inpatient Hospital Stay (HOSPITAL_COMMUNITY): Admission: RE | Admit: 2013-04-06 | Payer: BC Managed Care – PPO | Source: Ambulatory Visit

## 2013-09-10 ENCOUNTER — Other Ambulatory Visit (HOSPITAL_COMMUNITY): Payer: Self-pay | Admitting: Family Medicine

## 2013-09-10 DIAGNOSIS — Z0000 Encounter for general adult medical examination without abnormal findings: Secondary | ICD-10-CM

## 2013-09-11 ENCOUNTER — Ambulatory Visit (HOSPITAL_COMMUNITY)
Admission: RE | Admit: 2013-09-11 | Discharge: 2013-09-11 | Disposition: A | Discharge status: Home / Self Care | Payer: Medicare Other | Source: Ambulatory Visit | Attending: Family Medicine | Admitting: Family Medicine

## 2013-09-11 DIAGNOSIS — Z0000 Encounter for general adult medical examination without abnormal findings: Secondary | ICD-10-CM

## 2013-09-11 DIAGNOSIS — Z1231 Encounter for screening mammogram for malignant neoplasm of breast: Secondary | ICD-10-CM | POA: Insufficient documentation

## 2014-04-02 ENCOUNTER — Other Ambulatory Visit (HOSPITAL_COMMUNITY): Payer: Self-pay | Admitting: Internal Medicine

## 2014-04-02 DIAGNOSIS — M810 Age-related osteoporosis without current pathological fracture: Secondary | ICD-10-CM

## 2014-04-04 ENCOUNTER — Ambulatory Visit (HOSPITAL_COMMUNITY)
Admission: RE | Admit: 2014-04-04 | Discharge: 2014-04-04 | Disposition: A | Discharge status: Home / Self Care | Payer: Medicare Other | Source: Ambulatory Visit | Attending: Internal Medicine | Admitting: Internal Medicine

## 2014-04-04 ENCOUNTER — Other Ambulatory Visit (HOSPITAL_COMMUNITY): Payer: Medicare Other

## 2014-04-04 DIAGNOSIS — M810 Age-related osteoporosis without current pathological fracture: Secondary | ICD-10-CM | POA: Diagnosis not present

## 2014-10-08 ENCOUNTER — Other Ambulatory Visit (HOSPITAL_COMMUNITY): Payer: Self-pay | Admitting: Internal Medicine

## 2014-10-08 DIAGNOSIS — Z1231 Encounter for screening mammogram for malignant neoplasm of breast: Secondary | ICD-10-CM

## 2014-10-21 ENCOUNTER — Ambulatory Visit (HOSPITAL_COMMUNITY)
Admission: RE | Admit: 2014-10-21 | Discharge: 2014-10-21 | Disposition: A | Discharge status: Home / Self Care | Payer: Commercial Managed Care - HMO | Source: Ambulatory Visit | Attending: Internal Medicine | Admitting: Internal Medicine

## 2014-10-21 DIAGNOSIS — Z1231 Encounter for screening mammogram for malignant neoplasm of breast: Secondary | ICD-10-CM | POA: Diagnosis not present

## 2014-12-26 DIAGNOSIS — Z6841 Body Mass Index (BMI) 40.0 and over, adult: Secondary | ICD-10-CM | POA: Diagnosis not present

## 2014-12-26 DIAGNOSIS — L03818 Cellulitis of other sites: Secondary | ICD-10-CM | POA: Diagnosis not present

## 2014-12-26 DIAGNOSIS — Z1389 Encounter for screening for other disorder: Secondary | ICD-10-CM | POA: Diagnosis not present

## 2014-12-26 DIAGNOSIS — E119 Type 2 diabetes mellitus without complications: Secondary | ICD-10-CM | POA: Diagnosis not present

## 2014-12-26 DIAGNOSIS — B9561 Methicillin susceptible Staphylococcus aureus infection as the cause of diseases classified elsewhere: Secondary | ICD-10-CM | POA: Diagnosis not present

## 2015-06-16 DIAGNOSIS — Z0000 Encounter for general adult medical examination without abnormal findings: Secondary | ICD-10-CM | POA: Diagnosis not present

## 2015-06-16 DIAGNOSIS — E119 Type 2 diabetes mellitus without complications: Secondary | ICD-10-CM | POA: Diagnosis not present

## 2015-06-16 DIAGNOSIS — E782 Mixed hyperlipidemia: Secondary | ICD-10-CM | POA: Diagnosis not present

## 2015-06-16 DIAGNOSIS — Z1389 Encounter for screening for other disorder: Secondary | ICD-10-CM | POA: Diagnosis not present

## 2015-06-16 DIAGNOSIS — Z6841 Body Mass Index (BMI) 40.0 and over, adult: Secondary | ICD-10-CM | POA: Diagnosis not present

## 2015-06-23 DIAGNOSIS — E669 Obesity, unspecified: Secondary | ICD-10-CM | POA: Diagnosis not present

## 2015-06-23 DIAGNOSIS — E1165 Type 2 diabetes mellitus with hyperglycemia: Secondary | ICD-10-CM | POA: Diagnosis not present

## 2015-06-23 DIAGNOSIS — E119 Type 2 diabetes mellitus without complications: Secondary | ICD-10-CM | POA: Diagnosis not present

## 2015-06-23 DIAGNOSIS — E782 Mixed hyperlipidemia: Secondary | ICD-10-CM | POA: Diagnosis not present

## 2015-06-23 DIAGNOSIS — N182 Chronic kidney disease, stage 2 (mild): Secondary | ICD-10-CM | POA: Diagnosis not present

## 2015-06-23 DIAGNOSIS — Z6841 Body Mass Index (BMI) 40.0 and over, adult: Secondary | ICD-10-CM | POA: Diagnosis not present

## 2015-06-23 DIAGNOSIS — F329 Major depressive disorder, single episode, unspecified: Secondary | ICD-10-CM | POA: Diagnosis not present

## 2015-06-23 DIAGNOSIS — F419 Anxiety disorder, unspecified: Secondary | ICD-10-CM | POA: Diagnosis not present

## 2015-06-23 DIAGNOSIS — K219 Gastro-esophageal reflux disease without esophagitis: Secondary | ICD-10-CM | POA: Diagnosis not present

## 2015-07-04 ENCOUNTER — Other Ambulatory Visit (HOSPITAL_COMMUNITY): Payer: Self-pay | Admitting: Internal Medicine

## 2015-07-04 DIAGNOSIS — Z0000 Encounter for general adult medical examination without abnormal findings: Secondary | ICD-10-CM

## 2015-11-28 DIAGNOSIS — Z6841 Body Mass Index (BMI) 40.0 and over, adult: Secondary | ICD-10-CM | POA: Diagnosis not present

## 2015-11-28 DIAGNOSIS — M79641 Pain in right hand: Secondary | ICD-10-CM | POA: Diagnosis not present

## 2015-11-28 DIAGNOSIS — M1990 Unspecified osteoarthritis, unspecified site: Secondary | ICD-10-CM | POA: Diagnosis not present

## 2015-12-31 ENCOUNTER — Inpatient Hospital Stay (HOSPITAL_COMMUNITY): Admission: RE | Admit: 2015-12-31 | Payer: Commercial Managed Care - HMO | Source: Ambulatory Visit

## 2016-03-15 DIAGNOSIS — Z1389 Encounter for screening for other disorder: Secondary | ICD-10-CM | POA: Diagnosis not present

## 2016-03-15 DIAGNOSIS — B373 Candidiasis of vulva and vagina: Secondary | ICD-10-CM | POA: Diagnosis not present

## 2016-03-15 DIAGNOSIS — N760 Acute vaginitis: Secondary | ICD-10-CM | POA: Diagnosis not present

## 2016-03-15 DIAGNOSIS — Z23 Encounter for immunization: Secondary | ICD-10-CM | POA: Diagnosis not present

## 2016-03-15 DIAGNOSIS — L299 Pruritus, unspecified: Secondary | ICD-10-CM | POA: Diagnosis not present

## 2016-03-15 DIAGNOSIS — Z6841 Body Mass Index (BMI) 40.0 and over, adult: Secondary | ICD-10-CM | POA: Diagnosis not present

## 2016-06-01 DIAGNOSIS — H04123 Dry eye syndrome of bilateral lacrimal glands: Secondary | ICD-10-CM | POA: Diagnosis not present

## 2016-06-01 DIAGNOSIS — Z961 Presence of intraocular lens: Secondary | ICD-10-CM | POA: Diagnosis not present

## 2016-06-01 DIAGNOSIS — H524 Presbyopia: Secondary | ICD-10-CM | POA: Diagnosis not present

## 2016-06-01 DIAGNOSIS — H35371 Puckering of macula, right eye: Secondary | ICD-10-CM | POA: Diagnosis not present

## 2016-06-01 DIAGNOSIS — E119 Type 2 diabetes mellitus without complications: Secondary | ICD-10-CM | POA: Diagnosis not present

## 2016-06-07 DIAGNOSIS — E119 Type 2 diabetes mellitus without complications: Secondary | ICD-10-CM | POA: Diagnosis not present

## 2016-06-07 DIAGNOSIS — H43813 Vitreous degeneration, bilateral: Secondary | ICD-10-CM | POA: Diagnosis not present

## 2016-06-07 DIAGNOSIS — H26491 Other secondary cataract, right eye: Secondary | ICD-10-CM | POA: Diagnosis not present

## 2016-06-07 DIAGNOSIS — H35371 Puckering of macula, right eye: Secondary | ICD-10-CM | POA: Diagnosis not present

## 2016-06-23 DIAGNOSIS — H35371 Puckering of macula, right eye: Secondary | ICD-10-CM | POA: Diagnosis not present

## 2016-06-29 DIAGNOSIS — E119 Type 2 diabetes mellitus without complications: Secondary | ICD-10-CM | POA: Diagnosis not present

## 2016-06-29 DIAGNOSIS — K219 Gastro-esophageal reflux disease without esophagitis: Secondary | ICD-10-CM | POA: Diagnosis not present

## 2016-06-29 DIAGNOSIS — Z6841 Body Mass Index (BMI) 40.0 and over, adult: Secondary | ICD-10-CM | POA: Diagnosis not present

## 2016-06-29 DIAGNOSIS — Z1389 Encounter for screening for other disorder: Secondary | ICD-10-CM | POA: Diagnosis not present

## 2016-06-29 DIAGNOSIS — Z0001 Encounter for general adult medical examination with abnormal findings: Secondary | ICD-10-CM | POA: Diagnosis not present

## 2016-06-29 DIAGNOSIS — I10 Essential (primary) hypertension: Secondary | ICD-10-CM | POA: Diagnosis not present

## 2016-07-01 DIAGNOSIS — Z09 Encounter for follow-up examination after completed treatment for conditions other than malignant neoplasm: Secondary | ICD-10-CM | POA: Diagnosis not present

## 2016-07-01 DIAGNOSIS — E1165 Type 2 diabetes mellitus with hyperglycemia: Secondary | ICD-10-CM | POA: Diagnosis not present

## 2016-07-01 DIAGNOSIS — H35371 Puckering of macula, right eye: Secondary | ICD-10-CM | POA: Diagnosis not present

## 2016-07-12 ENCOUNTER — Other Ambulatory Visit (HOSPITAL_COMMUNITY): Payer: Self-pay | Admitting: Internal Medicine

## 2016-07-12 DIAGNOSIS — E2839 Other primary ovarian failure: Secondary | ICD-10-CM

## 2016-07-12 DIAGNOSIS — Z1231 Encounter for screening mammogram for malignant neoplasm of breast: Secondary | ICD-10-CM

## 2016-07-21 DIAGNOSIS — E782 Mixed hyperlipidemia: Secondary | ICD-10-CM | POA: Diagnosis not present

## 2016-07-21 DIAGNOSIS — Z1389 Encounter for screening for other disorder: Secondary | ICD-10-CM | POA: Diagnosis not present

## 2016-07-21 DIAGNOSIS — Z6841 Body Mass Index (BMI) 40.0 and over, adult: Secondary | ICD-10-CM | POA: Diagnosis not present

## 2016-07-21 DIAGNOSIS — N182 Chronic kidney disease, stage 2 (mild): Secondary | ICD-10-CM | POA: Diagnosis not present

## 2016-07-22 ENCOUNTER — Ambulatory Visit (HOSPITAL_COMMUNITY)
Admission: RE | Admit: 2016-07-22 | Discharge: 2016-07-22 | Disposition: A | Discharge status: Home / Self Care | Payer: Medicare HMO | Source: Ambulatory Visit | Attending: Internal Medicine | Admitting: Internal Medicine

## 2016-07-22 DIAGNOSIS — Z1231 Encounter for screening mammogram for malignant neoplasm of breast: Secondary | ICD-10-CM | POA: Diagnosis not present

## 2016-07-31 DIAGNOSIS — R1084 Generalized abdominal pain: Secondary | ICD-10-CM | POA: Diagnosis not present

## 2016-08-12 DIAGNOSIS — Z09 Encounter for follow-up examination after completed treatment for conditions other than malignant neoplasm: Secondary | ICD-10-CM | POA: Diagnosis not present

## 2016-08-12 DIAGNOSIS — H35371 Puckering of macula, right eye: Secondary | ICD-10-CM | POA: Diagnosis not present

## 2016-10-14 DIAGNOSIS — Z1211 Encounter for screening for malignant neoplasm of colon: Secondary | ICD-10-CM | POA: Diagnosis not present

## 2016-10-22 DIAGNOSIS — R252 Cramp and spasm: Secondary | ICD-10-CM | POA: Diagnosis not present

## 2016-10-22 DIAGNOSIS — F419 Anxiety disorder, unspecified: Secondary | ICD-10-CM | POA: Diagnosis not present

## 2016-10-22 DIAGNOSIS — Z6838 Body mass index (BMI) 38.0-38.9, adult: Secondary | ICD-10-CM | POA: Diagnosis not present

## 2016-10-22 DIAGNOSIS — E669 Obesity, unspecified: Secondary | ICD-10-CM | POA: Diagnosis not present

## 2016-10-22 DIAGNOSIS — Z1389 Encounter for screening for other disorder: Secondary | ICD-10-CM | POA: Diagnosis not present

## 2016-10-22 DIAGNOSIS — M542 Cervicalgia: Secondary | ICD-10-CM | POA: Diagnosis not present

## 2016-10-22 DIAGNOSIS — E119 Type 2 diabetes mellitus without complications: Secondary | ICD-10-CM | POA: Diagnosis not present

## 2016-10-22 DIAGNOSIS — K219 Gastro-esophageal reflux disease without esophagitis: Secondary | ICD-10-CM | POA: Diagnosis not present

## 2016-10-26 ENCOUNTER — Ambulatory Visit (HOSPITAL_COMMUNITY)
Admission: RE | Admit: 2016-10-26 | Discharge: 2016-10-26 | Disposition: A | Discharge status: Home / Self Care | Payer: Medicare HMO | Source: Ambulatory Visit | Attending: Internal Medicine | Admitting: Internal Medicine

## 2016-10-26 DIAGNOSIS — M85852 Other specified disorders of bone density and structure, left thigh: Secondary | ICD-10-CM | POA: Diagnosis not present

## 2016-10-26 DIAGNOSIS — Z780 Asymptomatic menopausal state: Secondary | ICD-10-CM | POA: Diagnosis not present

## 2016-10-26 DIAGNOSIS — M85832 Other specified disorders of bone density and structure, left forearm: Secondary | ICD-10-CM | POA: Diagnosis not present

## 2016-10-26 DIAGNOSIS — E2839 Other primary ovarian failure: Secondary | ICD-10-CM | POA: Diagnosis not present

## 2016-11-02 DIAGNOSIS — Z6838 Body mass index (BMI) 38.0-38.9, adult: Secondary | ICD-10-CM | POA: Diagnosis not present

## 2016-11-02 DIAGNOSIS — M5030 Other cervical disc degeneration, unspecified cervical region: Secondary | ICD-10-CM | POA: Diagnosis not present

## 2016-11-02 DIAGNOSIS — M542 Cervicalgia: Secondary | ICD-10-CM | POA: Diagnosis not present

## 2016-12-16 ENCOUNTER — Other Ambulatory Visit (HOSPITAL_COMMUNITY): Payer: Self-pay | Admitting: Family Medicine

## 2016-12-16 ENCOUNTER — Ambulatory Visit (HOSPITAL_COMMUNITY)
Admission: RE | Admit: 2016-12-16 | Discharge: 2016-12-16 | Disposition: A | Discharge status: Home / Self Care | Payer: Medicare HMO | Source: Ambulatory Visit | Attending: Family Medicine | Admitting: Family Medicine

## 2016-12-16 DIAGNOSIS — M5030 Other cervical disc degeneration, unspecified cervical region: Secondary | ICD-10-CM | POA: Diagnosis not present

## 2016-12-16 DIAGNOSIS — M542 Cervicalgia: Secondary | ICD-10-CM

## 2016-12-16 DIAGNOSIS — M5032 Other cervical disc degeneration, mid-cervical region, unspecified level: Secondary | ICD-10-CM | POA: Diagnosis not present

## 2016-12-17 DIAGNOSIS — M5020 Other cervical disc displacement, unspecified cervical region: Secondary | ICD-10-CM | POA: Diagnosis not present

## 2016-12-17 DIAGNOSIS — M5030 Other cervical disc degeneration, unspecified cervical region: Secondary | ICD-10-CM | POA: Diagnosis not present

## 2016-12-17 DIAGNOSIS — Z6839 Body mass index (BMI) 39.0-39.9, adult: Secondary | ICD-10-CM | POA: Diagnosis not present

## 2017-02-04 DIAGNOSIS — F419 Anxiety disorder, unspecified: Secondary | ICD-10-CM | POA: Diagnosis not present

## 2017-02-04 DIAGNOSIS — R5383 Other fatigue: Secondary | ICD-10-CM | POA: Diagnosis not present

## 2017-02-04 DIAGNOSIS — M5412 Radiculopathy, cervical region: Secondary | ICD-10-CM | POA: Diagnosis not present

## 2017-02-04 DIAGNOSIS — M47812 Spondylosis without myelopathy or radiculopathy, cervical region: Secondary | ICD-10-CM | POA: Diagnosis not present

## 2017-02-04 DIAGNOSIS — Z6841 Body Mass Index (BMI) 40.0 and over, adult: Secondary | ICD-10-CM | POA: Diagnosis not present

## 2017-02-04 DIAGNOSIS — G894 Chronic pain syndrome: Secondary | ICD-10-CM | POA: Diagnosis not present

## 2017-02-04 DIAGNOSIS — E669 Obesity, unspecified: Secondary | ICD-10-CM | POA: Diagnosis not present

## 2017-02-17 DIAGNOSIS — M542 Cervicalgia: Secondary | ICD-10-CM | POA: Diagnosis not present

## 2017-02-17 DIAGNOSIS — M5021 Other cervical disc displacement,  high cervical region: Secondary | ICD-10-CM | POA: Diagnosis not present

## 2017-02-17 DIAGNOSIS — Z6839 Body mass index (BMI) 39.0-39.9, adult: Secondary | ICD-10-CM | POA: Diagnosis not present

## 2017-02-17 DIAGNOSIS — M4802 Spinal stenosis, cervical region: Secondary | ICD-10-CM | POA: Diagnosis not present

## 2017-02-17 DIAGNOSIS — M5030 Other cervical disc degeneration, unspecified cervical region: Secondary | ICD-10-CM | POA: Diagnosis not present

## 2017-04-16 DIAGNOSIS — H6121 Impacted cerumen, right ear: Secondary | ICD-10-CM | POA: Diagnosis not present

## 2017-04-16 DIAGNOSIS — H6692 Otitis media, unspecified, left ear: Secondary | ICD-10-CM | POA: Diagnosis not present

## 2017-05-05 DIAGNOSIS — J069 Acute upper respiratory infection, unspecified: Secondary | ICD-10-CM | POA: Diagnosis not present

## 2017-05-05 DIAGNOSIS — Z1389 Encounter for screening for other disorder: Secondary | ICD-10-CM | POA: Diagnosis not present

## 2017-05-05 DIAGNOSIS — Z6841 Body Mass Index (BMI) 40.0 and over, adult: Secondary | ICD-10-CM | POA: Diagnosis not present

## 2017-06-28 DIAGNOSIS — E1165 Type 2 diabetes mellitus with hyperglycemia: Secondary | ICD-10-CM | POA: Diagnosis not present

## 2017-06-28 DIAGNOSIS — E782 Mixed hyperlipidemia: Secondary | ICD-10-CM | POA: Diagnosis not present

## 2017-06-28 DIAGNOSIS — I110 Hypertensive heart disease with heart failure: Secondary | ICD-10-CM | POA: Diagnosis not present

## 2017-06-28 DIAGNOSIS — N189 Chronic kidney disease, unspecified: Secondary | ICD-10-CM | POA: Diagnosis not present

## 2017-06-28 DIAGNOSIS — Z6841 Body Mass Index (BMI) 40.0 and over, adult: Secondary | ICD-10-CM | POA: Diagnosis not present

## 2017-06-28 DIAGNOSIS — N183 Chronic kidney disease, stage 3 (moderate): Secondary | ICD-10-CM | POA: Diagnosis not present

## 2017-06-28 DIAGNOSIS — D649 Anemia, unspecified: Secondary | ICD-10-CM | POA: Diagnosis not present

## 2017-06-28 DIAGNOSIS — E119 Type 2 diabetes mellitus without complications: Secondary | ICD-10-CM | POA: Diagnosis not present

## 2017-06-28 DIAGNOSIS — M5030 Other cervical disc degeneration, unspecified cervical region: Secondary | ICD-10-CM | POA: Diagnosis not present

## 2017-06-28 DIAGNOSIS — M5090 Cervical disc disorder, unspecified, unspecified cervical region: Secondary | ICD-10-CM | POA: Diagnosis not present

## 2017-06-28 DIAGNOSIS — M47812 Spondylosis without myelopathy or radiculopathy, cervical region: Secondary | ICD-10-CM | POA: Diagnosis not present

## 2017-06-28 DIAGNOSIS — F419 Anxiety disorder, unspecified: Secondary | ICD-10-CM | POA: Diagnosis not present

## 2017-07-23 ENCOUNTER — Emergency Department (HOSPITAL_COMMUNITY)
Admission: EM | Admit: 2017-07-23 | Discharge: 2017-07-23 | Disposition: A | Discharge status: Home / Self Care | Payer: Medicare HMO | Attending: Emergency Medicine | Admitting: Emergency Medicine

## 2017-07-23 ENCOUNTER — Encounter (HOSPITAL_COMMUNITY): Payer: Self-pay | Admitting: Emergency Medicine

## 2017-07-23 DIAGNOSIS — E119 Type 2 diabetes mellitus without complications: Secondary | ICD-10-CM | POA: Diagnosis not present

## 2017-07-23 DIAGNOSIS — M65242 Calcific tendinitis, left hand: Secondary | ICD-10-CM | POA: Insufficient documentation

## 2017-07-23 DIAGNOSIS — M25431 Effusion, right wrist: Secondary | ICD-10-CM

## 2017-07-23 DIAGNOSIS — I10 Essential (primary) hypertension: Secondary | ICD-10-CM | POA: Insufficient documentation

## 2017-07-23 DIAGNOSIS — M79642 Pain in left hand: Secondary | ICD-10-CM | POA: Diagnosis present

## 2017-07-23 HISTORY — DX: Depression, unspecified: F32.A

## 2017-07-23 HISTORY — DX: Gout, unspecified: M10.9

## 2017-07-23 HISTORY — DX: Major depressive disorder, single episode, unspecified: F32.9

## 2017-07-23 HISTORY — DX: Type 2 diabetes mellitus without complications: E11.9

## 2017-07-23 HISTORY — DX: Essential (primary) hypertension: I10

## 2017-07-23 MED ORDER — PREDNISONE 10 MG PO TABS: ORAL_TABLET | ORAL | 0 refills | Status: DC

## 2017-07-23 NOTE — ED Notes (Signed)
EDP at bedside  

## 2017-07-23 NOTE — ED Triage Notes (Signed)
Patient complaining of bilateral hand pain x 2 months, worsening last night. Denies history, states she has history of gout.

## 2017-07-23 NOTE — Discharge Instructions (Addendum)
We are prescribing prednisone to help the pain in the left hand and right wrist.  Additionally to help the pain, try using heat on the sore area 3 or 4 times a day for 30-40 minutes.  Use the wrist splint as needed, to help your pain.  Call the orthopedic doctor for a follow-up appointment to be seen about the pain.  Prednisone can increase your blood sugars to make sure you are being very careful with your carbohydrate intake.  Also try to drink 1-2 L of water each day to help control your blood sugar.  Your blood pressure is mildly elevated today and it is important to follow-up with your PCP for recheck on your blood pressure, in 1-2 weeks.  Use Tylenol, every 4 hours to help control the pain.

## 2017-07-23 NOTE — ED Provider Notes (Signed)
[facility] EMERGENCY DEPARTMENT Provider Note   CSN: [id number] Arrival date & time: 07/23/17  0827     History   Chief Complaint Chief Complaint  Patient presents with  . Hand Pain    HPI Alexandra [name] is a 72 y.o. female.  Presents for evaluation of a deformity of her left hand associated with pain, for 4 weeks.  Also yesterday she noticed some tenderness and swelling of the right wrist.  She has difficulty moving either the left hand or the right wrist secondary to pain.  Additionally she has been bothered by neck pain, and been treated with prednisone by her primary care doctor as recently as 3 weeks ago.  She only took a half of the prescription before she "lost the bottle."  She states she has been referred to a neurosurgeon regarding her neck pain.  She denies recent fever, chills, nausea or vomiting.  She has diabetes.  There are no other known modifying factors. HPI  Past Medical History:  Diagnosis Date  . Depression   . Diabetes mellitus without complication ([place])   . Gout   . Hypertension     There are no active problems to display for this patient.   Past Surgical History:  Procedure Laterality Date  . CARPAL TUNNEL RELEASE    . CHOLECYSTECTOMY    . REPLACEMENT TOTAL KNEE       OB History   None      Home Medications    Prior to Admission medications   Medication Sig Start Date End Date Taking? Authorizing Provider  predniSONE (DELTASONE) 10 MG tablet Take q day 6,5,4,3,2,1 07/23/17   Daleen Bo, MD    Family History History reviewed. No pertinent family history.  Social History Social History   Tobacco Use  . Smoking status: Never Smoker  . Smokeless tobacco: Never Used  Substance Use Topics  . Alcohol use: Yes    Frequency: Never    Comment: rarely  . Drug use: Never     Allergies   Codeine and Naproxen   Review of Systems Review of Systems  All other systems reviewed and are negative.    Physical Exam Updated Vital  Signs BP (!) 165/85 (BP Location: Left Arm)   Pulse (!) 110   Temp 98.7 F (37.1 C) (Oral)   Resp 18   Ht 5\' 2"  (1.575 m)   Wt 106.6 kg (235 lb)   SpO2 96%   BMI 42.98 kg/m   Physical Exam  Constitutional: She is oriented to person, place, and time. She appears well-developed and well-nourished. She appears distressed (She is uncomfortable).  HENT:  Head: Normocephalic and atraumatic.  Eyes: Pupils are equal, round, and reactive to light. Conjunctivae and EOM are normal.  Neck: Normal range of motion and phonation normal. Neck supple.  Cardiovascular: Normal rate.  Pulmonary/Chest: Effort normal. She exhibits no tenderness.  Musculoskeletal: Normal range of motion.  Right foot with mild tenderness and swelling dorsally, but fair range of motion.  Neurovascular intact distally in the right hand.  Left hand remarkable for flexion of the second finger with tenderness, along with flexion and sheath, at the MCP region.  A small skin dimple, at the radial aspect of the MCP region, without significant associated erythema.  I am unable to manually straighten the right hand second MCP secondary to pain.  Neurological: She is alert and oriented to person, place, and time. She exhibits normal muscle tone.  Skin: Skin is warm and dry.  Psychiatric: She has a normal mood and affect. Her behavior is normal. Judgment and thought content normal.  Nursing note and vitals reviewed.    ED Treatments / Results  Labs (all labs ordered are listed, but only abnormal results are displayed) Labs Reviewed - No data to display  EKG None  Radiology No results found.  Procedures Procedures (including critical care time)  Medications Ordered in ED Medications - No data to display   Initial Impression / Assessment and Plan / ED Course  I have reviewed the triage vital signs and the nursing notes.  Pertinent labs & imaging results that were available during my care of the patient were reviewed by  me and considered in my medical decision making (see chart for details).     *  Patient Vitals for the past 24 hrs:  BP Temp Temp src Pulse Resp SpO2 Height Weight  07/23/17 0838 - - - - - - 5\' 2"  (1.575 m) 106.6 kg (235 lb)  07/23/17 0837 (!) 165/85 98.7 F (37.1 C) Oral (!) 110 18 96 % - -      At discharge- reevaluation with update and discussion. After initial assessment and treatment, an updated evaluation reveals no additional complaints and no significant discomfort.  Findings discussed and questions answered. Dawson decision making-patient has 2 separate problems, left hand pain and deformity consistent with trigger finger, second MCP region.  This is an inflammatory process associated with tendinopathy.  Right wrist pain and swelling is nonspecific and is a wide differential for types of causes.  Patient also has neck pain, which is ongoing, and is likely arthritic.  Suspect nonspecific degenerative changes, associated with her various problems.  She can be treated symptomatically as an outpatient.  Nursing Notes Reviewed/ Care Coordinated Applicable Imaging Reviewed Interpretation of Laboratory Data incorporated into ED treatment  The patient appears reasonably screened and/or stabilized for discharge and I doubt any other medical condition or other [facility] requiring further screening, evaluation, or treatment in the ED at this time prior to discharge.  Plan: Home Medications-Tylenol as needed for pain, continue current medications; Home Treatments-heat to affected area; return here if the recommended treatment, does not improve the symptoms; Recommended follow up-orthopedic follow-up as needed    Final Clinical Impressions(s) / ED Diagnoses   Final diagnoses:  Calcific tendinitis, left hand  Wrist swelling, right  Hypertension, unspecified type    ED Discharge Orders        Ordered    predniSONE (DELTASONE) 10 MG tablet     07/23/17 0911         Daleen Bo, MD 07/23/17 [phone]

## 2017-08-16 DIAGNOSIS — R05 Cough: Secondary | ICD-10-CM | POA: Diagnosis not present

## 2017-08-16 DIAGNOSIS — J029 Acute pharyngitis, unspecified: Secondary | ICD-10-CM | POA: Diagnosis not present

## 2017-08-16 DIAGNOSIS — Z1389 Encounter for screening for other disorder: Secondary | ICD-10-CM | POA: Diagnosis not present

## 2017-08-16 DIAGNOSIS — Z6841 Body Mass Index (BMI) 40.0 and over, adult: Secondary | ICD-10-CM | POA: Diagnosis not present

## 2017-08-22 DIAGNOSIS — M19049 Primary osteoarthritis, unspecified hand: Secondary | ICD-10-CM | POA: Diagnosis not present

## 2017-08-22 DIAGNOSIS — M19042 Primary osteoarthritis, left hand: Secondary | ICD-10-CM | POA: Diagnosis not present

## 2017-08-22 DIAGNOSIS — M19041 Primary osteoarthritis, right hand: Secondary | ICD-10-CM | POA: Diagnosis not present

## 2017-08-22 DIAGNOSIS — Z6841 Body Mass Index (BMI) 40.0 and over, adult: Secondary | ICD-10-CM | POA: Diagnosis not present

## 2017-08-25 DIAGNOSIS — I10 Essential (primary) hypertension: Secondary | ICD-10-CM | POA: Diagnosis not present

## 2017-08-25 DIAGNOSIS — M47812 Spondylosis without myelopathy or radiculopathy, cervical region: Secondary | ICD-10-CM | POA: Diagnosis not present

## 2017-08-25 DIAGNOSIS — Z6841 Body Mass Index (BMI) 40.0 and over, adult: Secondary | ICD-10-CM | POA: Diagnosis not present

## 2017-09-08 DIAGNOSIS — M25532 Pain in left wrist: Secondary | ICD-10-CM | POA: Diagnosis not present

## 2017-09-08 DIAGNOSIS — M79642 Pain in left hand: Secondary | ICD-10-CM | POA: Diagnosis not present

## 2017-09-21 DIAGNOSIS — R5383 Other fatigue: Secondary | ICD-10-CM | POA: Diagnosis not present

## 2017-09-21 DIAGNOSIS — E790 Hyperuricemia without signs of inflammatory arthritis and tophaceous disease: Secondary | ICD-10-CM | POA: Diagnosis not present

## 2017-09-21 DIAGNOSIS — M150 Primary generalized (osteo)arthritis: Secondary | ICD-10-CM | POA: Diagnosis not present

## 2017-09-21 DIAGNOSIS — Z6841 Body Mass Index (BMI) 40.0 and over, adult: Secondary | ICD-10-CM | POA: Diagnosis not present

## 2017-09-21 DIAGNOSIS — M2550 Pain in unspecified joint: Secondary | ICD-10-CM | POA: Diagnosis not present

## 2017-09-21 DIAGNOSIS — M0579 Rheumatoid arthritis with rheumatoid factor of multiple sites without organ or systems involvement: Secondary | ICD-10-CM | POA: Diagnosis not present

## 2017-10-03 ENCOUNTER — Ambulatory Visit (INDEPENDENT_AMBULATORY_CARE_PROVIDER_SITE_OTHER): Payer: Medicare HMO | Admitting: Otolaryngology

## 2017-10-10 DIAGNOSIS — M2550 Pain in unspecified joint: Secondary | ICD-10-CM | POA: Diagnosis not present

## 2017-10-10 DIAGNOSIS — Z6841 Body Mass Index (BMI) 40.0 and over, adult: Secondary | ICD-10-CM | POA: Diagnosis not present

## 2017-10-10 DIAGNOSIS — E790 Hyperuricemia without signs of inflammatory arthritis and tophaceous disease: Secondary | ICD-10-CM | POA: Diagnosis not present

## 2017-10-10 DIAGNOSIS — M0579 Rheumatoid arthritis with rheumatoid factor of multiple sites without organ or systems involvement: Secondary | ICD-10-CM | POA: Diagnosis not present

## 2017-10-10 DIAGNOSIS — M150 Primary generalized (osteo)arthritis: Secondary | ICD-10-CM | POA: Diagnosis not present

## 2017-11-09 DIAGNOSIS — M0579 Rheumatoid arthritis with rheumatoid factor of multiple sites without organ or systems involvement: Secondary | ICD-10-CM | POA: Diagnosis not present

## 2017-11-10 ENCOUNTER — Ambulatory Visit (INDEPENDENT_AMBULATORY_CARE_PROVIDER_SITE_OTHER): Payer: Medicare HMO | Admitting: Otolaryngology

## 2017-11-10 DIAGNOSIS — D3709 Neoplasm of uncertain behavior of other specified sites of the oral cavity: Secondary | ICD-10-CM | POA: Diagnosis not present

## 2017-11-10 DIAGNOSIS — H6121 Impacted cerumen, right ear: Secondary | ICD-10-CM

## 2017-11-15 DIAGNOSIS — M069 Rheumatoid arthritis, unspecified: Secondary | ICD-10-CM | POA: Diagnosis not present

## 2017-11-15 DIAGNOSIS — E782 Mixed hyperlipidemia: Secondary | ICD-10-CM | POA: Diagnosis not present

## 2017-11-15 DIAGNOSIS — E119 Type 2 diabetes mellitus without complications: Secondary | ICD-10-CM | POA: Diagnosis not present

## 2017-11-15 DIAGNOSIS — M1A00X0 Idiopathic chronic gout, unspecified site, without tophus (tophi): Secondary | ICD-10-CM | POA: Diagnosis not present

## 2017-11-15 DIAGNOSIS — Z6841 Body Mass Index (BMI) 40.0 and over, adult: Secondary | ICD-10-CM | POA: Diagnosis not present

## 2017-11-15 DIAGNOSIS — R0681 Apnea, not elsewhere classified: Secondary | ICD-10-CM | POA: Diagnosis not present

## 2017-11-15 DIAGNOSIS — Z0001 Encounter for general adult medical examination with abnormal findings: Secondary | ICD-10-CM | POA: Diagnosis not present

## 2017-11-21 ENCOUNTER — Other Ambulatory Visit (HOSPITAL_COMMUNITY): Payer: Self-pay | Admitting: Family Medicine

## 2017-11-21 ENCOUNTER — Ambulatory Visit (HOSPITAL_COMMUNITY)
Admission: RE | Admit: 2017-11-21 | Discharge: 2017-11-21 | Disposition: A | Discharge status: Home / Self Care | Payer: Medicare HMO | Source: Ambulatory Visit | Attending: Family Medicine | Admitting: Family Medicine

## 2017-11-21 DIAGNOSIS — Z1231 Encounter for screening mammogram for malignant neoplasm of breast: Secondary | ICD-10-CM

## 2017-11-27 DIAGNOSIS — T148XXA Other injury of unspecified body region, initial encounter: Secondary | ICD-10-CM | POA: Diagnosis not present

## 2017-11-27 DIAGNOSIS — W010XXA Fall on same level from slipping, tripping and stumbling without subsequent striking against object, initial encounter: Secondary | ICD-10-CM | POA: Diagnosis not present

## 2017-11-27 DIAGNOSIS — Z87891 Personal history of nicotine dependence: Secondary | ICD-10-CM | POA: Diagnosis not present

## 2017-11-27 DIAGNOSIS — E119 Type 2 diabetes mellitus without complications: Secondary | ICD-10-CM | POA: Diagnosis not present

## 2017-11-27 DIAGNOSIS — M79602 Pain in left arm: Secondary | ICD-10-CM | POA: Diagnosis not present

## 2017-11-27 DIAGNOSIS — S42212A Unspecified displaced fracture of surgical neck of left humerus, initial encounter for closed fracture: Secondary | ICD-10-CM | POA: Diagnosis not present

## 2017-11-27 DIAGNOSIS — E7800 Pure hypercholesterolemia, unspecified: Secondary | ICD-10-CM | POA: Diagnosis not present

## 2017-11-27 DIAGNOSIS — S42292A Other displaced fracture of upper end of left humerus, initial encounter for closed fracture: Secondary | ICD-10-CM | POA: Diagnosis not present

## 2017-11-27 DIAGNOSIS — M19012 Primary osteoarthritis, left shoulder: Secondary | ICD-10-CM | POA: Diagnosis not present

## 2017-11-27 DIAGNOSIS — K219 Gastro-esophageal reflux disease without esophagitis: Secondary | ICD-10-CM | POA: Diagnosis not present

## 2017-11-27 DIAGNOSIS — M25512 Pain in left shoulder: Secondary | ICD-10-CM | POA: Diagnosis not present

## 2017-11-27 DIAGNOSIS — F329 Major depressive disorder, single episode, unspecified: Secondary | ICD-10-CM | POA: Diagnosis not present

## 2017-11-27 DIAGNOSIS — Z79899 Other long term (current) drug therapy: Secondary | ICD-10-CM | POA: Diagnosis not present

## 2017-11-29 DIAGNOSIS — S42295A Other nondisplaced fracture of upper end of left humerus, initial encounter for closed fracture: Secondary | ICD-10-CM | POA: Diagnosis not present

## 2017-12-06 DIAGNOSIS — S42295A Other nondisplaced fracture of upper end of left humerus, initial encounter for closed fracture: Secondary | ICD-10-CM | POA: Diagnosis not present

## 2017-12-08 DIAGNOSIS — M2550 Pain in unspecified joint: Secondary | ICD-10-CM | POA: Diagnosis not present

## 2017-12-08 DIAGNOSIS — E790 Hyperuricemia without signs of inflammatory arthritis and tophaceous disease: Secondary | ICD-10-CM | POA: Diagnosis not present

## 2017-12-08 DIAGNOSIS — M0579 Rheumatoid arthritis with rheumatoid factor of multiple sites without organ or systems involvement: Secondary | ICD-10-CM | POA: Diagnosis not present

## 2017-12-08 DIAGNOSIS — S42295D Other nondisplaced fracture of upper end of left humerus, subsequent encounter for fracture with routine healing: Secondary | ICD-10-CM | POA: Diagnosis not present

## 2017-12-08 DIAGNOSIS — M150 Primary generalized (osteo)arthritis: Secondary | ICD-10-CM | POA: Diagnosis not present

## 2017-12-08 DIAGNOSIS — Z6841 Body Mass Index (BMI) 40.0 and over, adult: Secondary | ICD-10-CM | POA: Diagnosis not present

## 2017-12-19 DIAGNOSIS — S42295D Other nondisplaced fracture of upper end of left humerus, subsequent encounter for fracture with routine healing: Secondary | ICD-10-CM | POA: Diagnosis not present

## 2017-12-28 DIAGNOSIS — S42202D Unspecified fracture of upper end of left humerus, subsequent encounter for fracture with routine healing: Secondary | ICD-10-CM | POA: Diagnosis not present

## 2017-12-28 DIAGNOSIS — M25512 Pain in left shoulder: Secondary | ICD-10-CM | POA: Diagnosis not present

## 2018-01-03 DIAGNOSIS — M25512 Pain in left shoulder: Secondary | ICD-10-CM | POA: Diagnosis not present

## 2018-01-03 DIAGNOSIS — S42202D Unspecified fracture of upper end of left humerus, subsequent encounter for fracture with routine healing: Secondary | ICD-10-CM | POA: Diagnosis not present

## 2018-01-10 DIAGNOSIS — M25512 Pain in left shoulder: Secondary | ICD-10-CM | POA: Diagnosis not present

## 2018-01-10 DIAGNOSIS — S42202D Unspecified fracture of upper end of left humerus, subsequent encounter for fracture with routine healing: Secondary | ICD-10-CM | POA: Diagnosis not present

## 2018-01-10 DIAGNOSIS — S42295A Other nondisplaced fracture of upper end of left humerus, initial encounter for closed fracture: Secondary | ICD-10-CM | POA: Diagnosis not present

## 2018-01-18 DIAGNOSIS — M25512 Pain in left shoulder: Secondary | ICD-10-CM | POA: Diagnosis not present

## 2018-01-18 DIAGNOSIS — S42202D Unspecified fracture of upper end of left humerus, subsequent encounter for fracture with routine healing: Secondary | ICD-10-CM | POA: Diagnosis not present

## 2018-01-25 DIAGNOSIS — M25512 Pain in left shoulder: Secondary | ICD-10-CM | POA: Diagnosis not present

## 2018-01-25 DIAGNOSIS — S42202D Unspecified fracture of upper end of left humerus, subsequent encounter for fracture with routine healing: Secondary | ICD-10-CM | POA: Diagnosis not present

## 2018-02-01 DIAGNOSIS — M25512 Pain in left shoulder: Secondary | ICD-10-CM | POA: Diagnosis not present

## 2018-02-01 DIAGNOSIS — S42202D Unspecified fracture of upper end of left humerus, subsequent encounter for fracture with routine healing: Secondary | ICD-10-CM | POA: Diagnosis not present

## 2018-02-07 DIAGNOSIS — E790 Hyperuricemia without signs of inflammatory arthritis and tophaceous disease: Secondary | ICD-10-CM | POA: Diagnosis not present

## 2018-02-07 DIAGNOSIS — S42295A Other nondisplaced fracture of upper end of left humerus, initial encounter for closed fracture: Secondary | ICD-10-CM | POA: Diagnosis not present

## 2018-02-07 DIAGNOSIS — M25512 Pain in left shoulder: Secondary | ICD-10-CM | POA: Diagnosis not present

## 2018-02-07 DIAGNOSIS — M150 Primary generalized (osteo)arthritis: Secondary | ICD-10-CM | POA: Diagnosis not present

## 2018-02-07 DIAGNOSIS — Z79899 Other long term (current) drug therapy: Secondary | ICD-10-CM | POA: Diagnosis not present

## 2018-02-07 DIAGNOSIS — M0579 Rheumatoid arthritis with rheumatoid factor of multiple sites without organ or systems involvement: Secondary | ICD-10-CM | POA: Diagnosis not present

## 2018-02-07 DIAGNOSIS — S42202D Unspecified fracture of upper end of left humerus, subsequent encounter for fracture with routine healing: Secondary | ICD-10-CM | POA: Diagnosis not present

## 2018-02-07 DIAGNOSIS — Z6841 Body Mass Index (BMI) 40.0 and over, adult: Secondary | ICD-10-CM | POA: Diagnosis not present

## 2018-02-07 DIAGNOSIS — M2550 Pain in unspecified joint: Secondary | ICD-10-CM | POA: Diagnosis not present

## 2018-02-07 DIAGNOSIS — Z23 Encounter for immunization: Secondary | ICD-10-CM | POA: Diagnosis not present

## 2018-02-14 DIAGNOSIS — H35371 Puckering of macula, right eye: Secondary | ICD-10-CM | POA: Diagnosis not present

## 2018-02-14 DIAGNOSIS — E119 Type 2 diabetes mellitus without complications: Secondary | ICD-10-CM | POA: Diagnosis not present

## 2018-02-15 DIAGNOSIS — M25512 Pain in left shoulder: Secondary | ICD-10-CM | POA: Diagnosis not present

## 2018-02-15 DIAGNOSIS — S42202D Unspecified fracture of upper end of left humerus, subsequent encounter for fracture with routine healing: Secondary | ICD-10-CM | POA: Diagnosis not present

## 2018-02-21 DIAGNOSIS — S42202D Unspecified fracture of upper end of left humerus, subsequent encounter for fracture with routine healing: Secondary | ICD-10-CM | POA: Diagnosis not present

## 2018-02-21 DIAGNOSIS — M25512 Pain in left shoulder: Secondary | ICD-10-CM | POA: Diagnosis not present

## 2018-02-28 DIAGNOSIS — M25512 Pain in left shoulder: Secondary | ICD-10-CM | POA: Diagnosis not present

## 2018-02-28 DIAGNOSIS — S42202D Unspecified fracture of upper end of left humerus, subsequent encounter for fracture with routine healing: Secondary | ICD-10-CM | POA: Diagnosis not present

## 2018-03-02 DIAGNOSIS — K219 Gastro-esophageal reflux disease without esophagitis: Secondary | ICD-10-CM | POA: Diagnosis not present

## 2018-03-02 DIAGNOSIS — Z1389 Encounter for screening for other disorder: Secondary | ICD-10-CM | POA: Diagnosis not present

## 2018-03-02 DIAGNOSIS — Z6841 Body Mass Index (BMI) 40.0 and over, adult: Secondary | ICD-10-CM | POA: Diagnosis not present

## 2018-03-06 DIAGNOSIS — H26493 Other secondary cataract, bilateral: Secondary | ICD-10-CM | POA: Diagnosis not present

## 2018-03-07 DIAGNOSIS — S42202D Unspecified fracture of upper end of left humerus, subsequent encounter for fracture with routine healing: Secondary | ICD-10-CM | POA: Diagnosis not present

## 2018-03-07 DIAGNOSIS — M25512 Pain in left shoulder: Secondary | ICD-10-CM | POA: Diagnosis not present

## 2018-03-09 DIAGNOSIS — M25512 Pain in left shoulder: Secondary | ICD-10-CM | POA: Diagnosis not present

## 2018-03-13 DIAGNOSIS — M069 Rheumatoid arthritis, unspecified: Secondary | ICD-10-CM | POA: Diagnosis not present

## 2018-03-13 DIAGNOSIS — H04123 Dry eye syndrome of bilateral lacrimal glands: Secondary | ICD-10-CM | POA: Diagnosis not present

## 2018-03-13 DIAGNOSIS — H26492 Other secondary cataract, left eye: Secondary | ICD-10-CM | POA: Diagnosis not present

## 2018-03-13 DIAGNOSIS — Z961 Presence of intraocular lens: Secondary | ICD-10-CM | POA: Diagnosis not present

## 2018-03-14 DIAGNOSIS — M25512 Pain in left shoulder: Secondary | ICD-10-CM | POA: Diagnosis not present

## 2018-03-14 DIAGNOSIS — S42202D Unspecified fracture of upper end of left humerus, subsequent encounter for fracture with routine healing: Secondary | ICD-10-CM | POA: Diagnosis not present

## 2018-03-21 DIAGNOSIS — M25512 Pain in left shoulder: Secondary | ICD-10-CM | POA: Diagnosis not present

## 2018-03-21 DIAGNOSIS — S42202D Unspecified fracture of upper end of left humerus, subsequent encounter for fracture with routine healing: Secondary | ICD-10-CM | POA: Diagnosis not present

## 2018-03-30 ENCOUNTER — Encounter (INDEPENDENT_AMBULATORY_CARE_PROVIDER_SITE_OTHER): Payer: Self-pay | Admitting: *Deleted

## 2018-03-30 ENCOUNTER — Encounter (INDEPENDENT_AMBULATORY_CARE_PROVIDER_SITE_OTHER): Payer: Self-pay | Admitting: Internal Medicine

## 2018-03-30 ENCOUNTER — Ambulatory Visit (INDEPENDENT_AMBULATORY_CARE_PROVIDER_SITE_OTHER): Payer: Medicare HMO | Admitting: Internal Medicine

## 2018-03-30 VITALS — BP 130/82 | HR 68 | Temp 98.3°F | Ht 61.0 in | Wt 218.8 lb

## 2018-03-30 DIAGNOSIS — K219 Gastro-esophageal reflux disease without esophagitis: Secondary | ICD-10-CM

## 2018-03-30 DIAGNOSIS — R062 Wheezing: Secondary | ICD-10-CM

## 2018-03-30 NOTE — Patient Instructions (Signed)
DG esophagram.   

## 2018-03-30 NOTE — Progress Notes (Signed)
Subjective:    Patient ID: Alexandra [name], Alexandra [name]    DOB: Apr 09, 1946, 72 y.o.   MRN: [id number]  HPI Referred by Dr. Gerarda Fraction for GERD. She says she coughs up constant mucous. Symptoms for years.  Recently saw Dr. Benjamine Mola. She thought she had a growth on [place]. Underwent an EGD years ago by Dr. Gala Romney for cough and hoarseness.  Has seen ENT  In [place] (she did not know name) several years ago and was put on Omeprazole. She says the Omeprazole helped. She is not short of breath. She does hear herself wheezing (upper chest).  She was told what diet to stay on. She did not stick to diet. She continued s drinking everything that had caffeine and was still eating chocolate . About a year ago, she switched to decaffeinated coffee. Her symptoms were a lot better. Has been off all caffeine since Halloween. She says she is not any better. She has a cough in office and the mucous is clear to green. She says the mucous is usually green.  Her appetite is okay. No weight loss.  BMs are normal Quit smoking at age 20. Smoked for 35 yrs ago. Smoked almost 2 packs a day.  Lemon and honey cough drops help her with the cough. The cough is worse in the morning.   EGD with biopsy 06/28/2008 Dr. Gala Romney which revealed:  INDICATIONS FOR PROCEDURE:  This 72 year-old lady with chronic  hoarseness and cough who has not had an ENT evaluation, recently  underwent a barium pill esophagram ordered by Dr. Gerarda Fraction which revealed  some extrinsic compression on the posterior aspect of the cervix  esophageal level of C4, C5- C6, C6-C7 and some asymmetrical appearance  of the impression.  On the upper area of the cervical esophagus at the  level of the left piriform sinus, radiology could not rule out mass  effect or some other process.  Alexandra [name] denies any dysphagia  whatsoever.     IMPRESSION:  Extrinsic compression on cervical esophagus consistent with  barium pill esophagram findings of doubtful clinical significance.  Multiple  hepatic gastric nodules with one large nodule (likely one seen  previously), appears benign, status post biopsy.  Remainder of stomach  appeared normal.  Normal D1 and D2.    Review of Systems Past Medical History:  Diagnosis Date  . Depression   . Diabetes mellitus without complication ([place])   . Gout   . Hypertension     Past Surgical History:  Procedure Laterality Date  . CARPAL TUNNEL RELEASE    . CHOLECYSTECTOMY    . REPLACEMENT TOTAL KNEE      Allergies  Allergen Reactions  . Codeine Nausea And Vomiting  . Naproxen Itching    Current Outpatient Medications on File Prior to Visit  Medication Sig Dispense Refill  . atorvastatin (LIPITOR) 20 MG tablet Take 20 mg by mouth daily.    . celecoxib (CELEBREX) 200 MG capsule Take 200 mg by mouth 2 (two) times daily.    . Coenzyme Q10 (COQ10) 100 MG CAPS Take by mouth.    . Ferrous Sulfate (FEROSUL PO) Take by mouth.    [name] FLUoxetine (PROZAC) 20 MG capsule Take 20 mg by mouth daily.    . folic acid (FOLVITE) 1 MG tablet Take by mouth daily.    [name] lamoTRIgine (LAMICTAL) 150 MG tablet Take 150 mg by mouth daily.    [name] losartan (COZAAR) 50 MG tablet Take 50 mg by mouth daily.    [name]  metFORMIN (GLUCOPHAGE) 500 MG tablet Take by mouth daily.    . methotrexate (50 MG/ML) 1 g injection Inject into the vein once a week. She did not have dose with her.    . metoprolol succinate (TOPROL-XL) 50 MG 24 hr tablet Take 50 mg by mouth daily. Take with or immediately following a meal.    . omeprazole (PRILOSEC) 40 MG capsule Take 40 mg by mouth at bedtime.    . ranitidine (ZANTAC) 75 MG tablet Take by mouth daily.     No current facility-administered medications on file prior to visit.         Objective:   Physical Exam Blood pressure 130/82, pulse 68, temperature 98.3 F (36.8 C), height 5\' 1"  (1.549 m), weight 218 lb 12.8 oz (99.2 kg).  Alert and oriented. Skin warm and dry. Oral mucosa is moist.   . Sclera anicteric, conjunctivae is pink.  Thyroid not enlarged. No cervical lymphadenopathy. Bilateral wheezes standing up. Heart regular rate and rhythm.  Abdomen is soft. Bowel sounds are positive. No hepatomegaly. No abdominal masses felt. No tenderness.  No edema to lower extremities.          Assessment & Plan:  ? GERD, reflux. Am going to get an Esophagram. Further recommendations to follow. Will discuss with Dr. Laural Golden after esophagram is back.

## 2018-04-04 DIAGNOSIS — M25512 Pain in left shoulder: Secondary | ICD-10-CM | POA: Diagnosis not present

## 2018-04-04 DIAGNOSIS — S42202D Unspecified fracture of upper end of left humerus, subsequent encounter for fracture with routine healing: Secondary | ICD-10-CM | POA: Diagnosis not present

## 2018-04-06 ENCOUNTER — Ambulatory Visit (HOSPITAL_COMMUNITY)
Admission: RE | Admit: 2018-04-06 | Discharge: 2018-04-06 | Disposition: A | Discharge status: Home / Self Care | Payer: Medicare HMO | Source: Ambulatory Visit | Attending: Internal Medicine | Admitting: Internal Medicine

## 2018-04-06 DIAGNOSIS — R062 Wheezing: Secondary | ICD-10-CM | POA: Diagnosis not present

## 2018-04-06 DIAGNOSIS — M67912 Unspecified disorder of synovium and tendon, left shoulder: Secondary | ICD-10-CM | POA: Diagnosis not present

## 2018-04-06 DIAGNOSIS — K219 Gastro-esophageal reflux disease without esophagitis: Secondary | ICD-10-CM | POA: Insufficient documentation

## 2018-04-11 DIAGNOSIS — S42202D Unspecified fracture of upper end of left humerus, subsequent encounter for fracture with routine healing: Secondary | ICD-10-CM | POA: Diagnosis not present

## 2018-04-11 DIAGNOSIS — S63501A Unspecified sprain of right wrist, initial encounter: Secondary | ICD-10-CM | POA: Diagnosis not present

## 2018-04-11 DIAGNOSIS — M25512 Pain in left shoulder: Secondary | ICD-10-CM | POA: Diagnosis not present

## 2018-05-01 DIAGNOSIS — M25512 Pain in left shoulder: Secondary | ICD-10-CM | POA: Diagnosis not present

## 2018-05-01 DIAGNOSIS — S42202D Unspecified fracture of upper end of left humerus, subsequent encounter for fracture with routine healing: Secondary | ICD-10-CM | POA: Diagnosis not present

## 2018-05-09 DIAGNOSIS — Z79899 Other long term (current) drug therapy: Secondary | ICD-10-CM | POA: Diagnosis not present

## 2018-05-09 DIAGNOSIS — M0579 Rheumatoid arthritis with rheumatoid factor of multiple sites without organ or systems involvement: Secondary | ICD-10-CM | POA: Diagnosis not present

## 2018-05-09 DIAGNOSIS — Z6841 Body Mass Index (BMI) 40.0 and over, adult: Secondary | ICD-10-CM | POA: Diagnosis not present

## 2018-05-09 DIAGNOSIS — E790 Hyperuricemia without signs of inflammatory arthritis and tophaceous disease: Secondary | ICD-10-CM | POA: Diagnosis not present

## 2018-05-09 DIAGNOSIS — M2550 Pain in unspecified joint: Secondary | ICD-10-CM | POA: Diagnosis not present

## 2018-05-09 DIAGNOSIS — M150 Primary generalized (osteo)arthritis: Secondary | ICD-10-CM | POA: Diagnosis not present

## 2018-05-15 ENCOUNTER — Ambulatory Visit (INDEPENDENT_AMBULATORY_CARE_PROVIDER_SITE_OTHER): Payer: Medicare HMO | Admitting: Otolaryngology

## 2018-05-15 DIAGNOSIS — D3705 Neoplasm of uncertain behavior of pharynx: Secondary | ICD-10-CM | POA: Diagnosis not present

## 2018-05-15 DIAGNOSIS — K219 Gastro-esophageal reflux disease without esophagitis: Secondary | ICD-10-CM

## 2018-05-15 DIAGNOSIS — R05 Cough: Secondary | ICD-10-CM

## 2018-05-15 DIAGNOSIS — R070 Pain in throat: Secondary | ICD-10-CM

## 2018-06-13 DIAGNOSIS — M0579 Rheumatoid arthritis with rheumatoid factor of multiple sites without organ or systems involvement: Secondary | ICD-10-CM | POA: Diagnosis not present

## 2018-06-26 ENCOUNTER — Ambulatory Visit (INDEPENDENT_AMBULATORY_CARE_PROVIDER_SITE_OTHER): Payer: Medicare HMO | Admitting: Otolaryngology

## 2018-06-26 DIAGNOSIS — R070 Pain in throat: Secondary | ICD-10-CM

## 2018-06-26 DIAGNOSIS — R05 Cough: Secondary | ICD-10-CM | POA: Diagnosis not present

## 2018-07-11 DIAGNOSIS — M2550 Pain in unspecified joint: Secondary | ICD-10-CM | POA: Diagnosis not present

## 2018-07-11 DIAGNOSIS — M150 Primary generalized (osteo)arthritis: Secondary | ICD-10-CM | POA: Diagnosis not present

## 2018-07-11 DIAGNOSIS — Z79899 Other long term (current) drug therapy: Secondary | ICD-10-CM | POA: Diagnosis not present

## 2018-07-11 DIAGNOSIS — E790 Hyperuricemia without signs of inflammatory arthritis and tophaceous disease: Secondary | ICD-10-CM | POA: Diagnosis not present

## 2018-07-11 DIAGNOSIS — Z6841 Body Mass Index (BMI) 40.0 and over, adult: Secondary | ICD-10-CM | POA: Diagnosis not present

## 2018-07-11 DIAGNOSIS — M0579 Rheumatoid arthritis with rheumatoid factor of multiple sites without organ or systems involvement: Secondary | ICD-10-CM | POA: Diagnosis not present

## 2018-08-16 ENCOUNTER — Encounter: Payer: Self-pay | Admitting: Internal Medicine

## 2018-08-16 DIAGNOSIS — Z0001 Encounter for general adult medical examination with abnormal findings: Secondary | ICD-10-CM | POA: Diagnosis not present

## 2018-08-16 DIAGNOSIS — R0681 Apnea, not elsewhere classified: Secondary | ICD-10-CM | POA: Diagnosis not present

## 2018-08-16 DIAGNOSIS — Z6841 Body Mass Index (BMI) 40.0 and over, adult: Secondary | ICD-10-CM | POA: Diagnosis not present

## 2018-08-16 DIAGNOSIS — E7849 Other hyperlipidemia: Secondary | ICD-10-CM | POA: Diagnosis not present

## 2018-08-16 DIAGNOSIS — M069 Rheumatoid arthritis, unspecified: Secondary | ICD-10-CM | POA: Diagnosis not present

## 2018-08-16 DIAGNOSIS — E1165 Type 2 diabetes mellitus with hyperglycemia: Secondary | ICD-10-CM | POA: Diagnosis not present

## 2018-08-16 DIAGNOSIS — F419 Anxiety disorder, unspecified: Secondary | ICD-10-CM | POA: Diagnosis not present

## 2018-08-16 DIAGNOSIS — N183 Chronic kidney disease, stage 3 (moderate): Secondary | ICD-10-CM | POA: Diagnosis not present

## 2018-08-16 DIAGNOSIS — I10 Essential (primary) hypertension: Secondary | ICD-10-CM | POA: Diagnosis not present

## 2018-08-16 DIAGNOSIS — E559 Vitamin D deficiency, unspecified: Secondary | ICD-10-CM | POA: Diagnosis not present

## 2018-09-07 DIAGNOSIS — R748 Abnormal levels of other serum enzymes: Secondary | ICD-10-CM | POA: Diagnosis not present

## 2018-09-07 DIAGNOSIS — R945 Abnormal results of liver function studies: Secondary | ICD-10-CM | POA: Diagnosis not present

## 2018-10-06 DIAGNOSIS — E119 Type 2 diabetes mellitus without complications: Secondary | ICD-10-CM | POA: Diagnosis not present

## 2018-10-06 DIAGNOSIS — E669 Obesity, unspecified: Secondary | ICD-10-CM | POA: Diagnosis not present

## 2018-10-06 DIAGNOSIS — Z6841 Body Mass Index (BMI) 40.0 and over, adult: Secondary | ICD-10-CM | POA: Diagnosis not present

## 2018-10-06 DIAGNOSIS — Z1389 Encounter for screening for other disorder: Secondary | ICD-10-CM | POA: Diagnosis not present

## 2018-10-06 DIAGNOSIS — L0101 Non-bullous impetigo: Secondary | ICD-10-CM | POA: Diagnosis not present

## 2018-10-06 DIAGNOSIS — I10 Essential (primary) hypertension: Secondary | ICD-10-CM | POA: Diagnosis not present

## 2018-10-10 ENCOUNTER — Encounter (INDEPENDENT_AMBULATORY_CARE_PROVIDER_SITE_OTHER): Payer: Self-pay | Admitting: Internal Medicine

## 2018-10-10 ENCOUNTER — Ambulatory Visit (INDEPENDENT_AMBULATORY_CARE_PROVIDER_SITE_OTHER): Payer: Medicare HMO | Admitting: Internal Medicine

## 2018-10-10 ENCOUNTER — Other Ambulatory Visit: Payer: Self-pay

## 2018-10-10 DIAGNOSIS — R945 Abnormal results of liver function studies: Secondary | ICD-10-CM

## 2018-10-10 DIAGNOSIS — R7989 Other specified abnormal findings of blood chemistry: Secondary | ICD-10-CM | POA: Insufficient documentation

## 2018-10-10 NOTE — Progress Notes (Addendum)
Presenting complaint;  Elevated transaminases.  History of present illness  Patient is 73 year old Caucasian female who is referred through courtesy of Dr. Gypsy Balsam for evaluation regarding elevated transaminases.  Patient is well-known to this practice.  She was last seen on 03/30/2018 for GERD.  Patient had routine blood work on 08/17/2018.  Her AST was 62 and ALT was 104.  Bilirubin was 0.4 and alkaline phosphatase was 122.  She says she had viral markers and these were negative. She has history of rheumatoid arthritis and she is under care of Dr. Gavin Pound.  Patient tells me that Dr. Lenna Gilford asked her to hold methotrexate for 2 weeks and thereafter begin 20 mg every week.  It appears she was on 25 mg weekly before.  Patient believes she has been on methotrexate for about 15 months.  She was taking oral preparation for 2 months and thereafter subcutaneous dose. There is no history of hepatitis or elevated transaminases.  She denies abdominal pain or pruritus.  She has been on Lamictal and metformin for the past few years.  Similarly she has been on atorvastatin for the past couple of years.  She does not recall that her LFTs are ever been abnormal.  She states her peak weight was 250 pounds and she is been gradually losing weight.  In the last 6 months she has lost 3 pounds. She is not sure if she has received hepatitis a and B vaccination. Family history is negative for chronic liver disease.    Current Medications: Outpatient Encounter Medications as of 10/10/2018  Medication Sig  . atorvastatin (LIPITOR) 20 MG tablet Take 20 mg by mouth daily.  . Coenzyme Q10 (COQ10) 100 MG CAPS Take by mouth every other day.   . Ferrous Sulfate (FEROSUL PO) Take by mouth daily.   . folic acid (FOLVITE) 1 MG tablet Take by mouth daily.  [name] lamoTRIgine (LAMICTAL) 150 MG tablet Take 150 mg by mouth daily.  [name] losartan (COZAAR) 50 MG tablet Take 50 mg by mouth daily.  . metFORMIN (GLUCOPHAGE) 500 MG tablet  Take by mouth daily.  . metoprolol succinate (TOPROL-XL) 50 MG 24 hr tablet Take 50 mg by mouth daily. Take with or immediately following a meal.  . omeprazole (PRILOSEC) 40 MG capsule Take 40 mg by mouth at bedtime.  . methotrexate (50 MG/ML) 1 g injection Inject into the vein once a week. She did not have dose with her.  . [DISCONTINUED] celecoxib (CELEBREX) 200 MG capsule Take 200 mg by mouth 2 (two) times daily.  . [DISCONTINUED] FLUoxetine (PROZAC) 20 MG capsule Take 20 mg by mouth daily.  . [DISCONTINUED] ranitidine (ZANTAC) 75 MG tablet Take by mouth daily.   No facility-administered encounter medications on file as of 10/10/2018.    Past Medical History:  Diagnosis Date  . Depression   . Diabetes mellitus without complication ([place])   .  Obesity.   . Hypertension         GERD.  EGD in March 2010 revealed gastritis and biopsies negative for H. pylori.       Hyperlipidemia.       She also has a history of colonic adenomas.  Past Surgical History:  Procedure Laterality Date  . CARPAL TUNNEL RELEASE    . CHOLECYSTECTOMY    . REPLACEMENT TOTAL KNEE; left knee in January 2009.     Allergies  Allergies  Allergen Reactions  . Codeine Nausea And Vomiting  . Naproxen Itching   Family history  Both  parents are deceased  She has 1 brother in good health.  Her sister age 42 is undergoing chemotherapy for lung cancer.  Social history  She is widowed.  She lost her husband at age 70 because of alcoholic cirrhosis.  She does not drink alcohol.  She smokes cigarettes for 32 years but quit 20 years ago.  She is retired and now working part-time at [employer] block.  She has 2 children.  Objective: Blood pressure 113/68, pulse 71, temperature 98.2 F (36.8 C), temperature source Oral, resp. rate 18, height 5\' 1"  (1.549 m), weight 215 lb 3.2 oz (97.6 kg). Patient is alert and in no acute distress. Conjunctiva is pink. Sclera is nonicteric Oropharyngeal mucosa is normal. She has upper and  lower dentures in place. No neck masses or thyromegaly noted. Cardiac exam with regular rhythm normal S1 and S2. No murmur or gallop noted. Lungs are clear to auscultation. Abdomen is full but soft and nontender with organomegaly or masses.  Liver edge is indistinct but appears to be soft below the right costal margin. No LE edema or clubbing noted.  Labs/studies Results: Lab data from 08/17/2018 WBC 9.8 H&H 11.5 and 34 Eosinophil count is 4%. Platelet count 389K  Glucose 81 BUN 20 and creatinine 1.11 Serum sodium 137, potassium 4.2, chloride 99 and CO2 30. Bilirubin 0.4, AP 122, AST 62, ALT 104, total protein 6.7 and albumin 4.1.   Assessment:  #1.  Mildly elevated transaminases.  While prior lab studies are not available it is presumed that transaminases previously have been normal.  This mild bump is most likely due to methotrexate which is on hold and will be started at a low dose as recommended by her rheumatologist Dr. Gavin Pound.  She possibly has fatty liver given history of obesity but she has been gradually losing weight.  Therefore it is unlikely that elevated transaminases are due to nonalcoholic fatty liver disease.  Similarly it is unlikely due to Lamictal or atorvastatin.  #2.  History of colonic adenomas.  Last colonoscopy on file is in 2007.  Will retrieve records to make sure she is up-to-date on surveillance schedule.   Recommendations:  Upper abdominal ultrasound. Will request copy of LFTs from 2018 and 2019 for review. She is to have LFTs repeated by Dr. Gavin Pound in 2 weeks. Further recommendations to follow.   Addendum  AST and ALT were 22 and 19 respectively on 07/21/2016 AST and ALT were 1711 respectively on 06/28/2017.

## 2018-10-10 NOTE — Patient Instructions (Signed)
Physician will call with results of ultrasound and blood work 2018.

## 2018-10-19 ENCOUNTER — Ambulatory Visit (HOSPITAL_COMMUNITY)
Admission: RE | Admit: 2018-10-19 | Discharge: 2018-10-19 | Disposition: A | Discharge status: Home / Self Care | Payer: Medicare HMO | Source: Ambulatory Visit | Attending: Internal Medicine | Admitting: Internal Medicine

## 2018-10-19 ENCOUNTER — Other Ambulatory Visit: Payer: Self-pay

## 2018-10-19 DIAGNOSIS — R7989 Other specified abnormal findings of blood chemistry: Secondary | ICD-10-CM | POA: Diagnosis not present

## 2018-10-19 DIAGNOSIS — R945 Abnormal results of liver function studies: Secondary | ICD-10-CM | POA: Diagnosis not present

## 2018-10-19 DIAGNOSIS — I10 Essential (primary) hypertension: Secondary | ICD-10-CM | POA: Diagnosis not present

## 2018-10-19 DIAGNOSIS — E119 Type 2 diabetes mellitus without complications: Secondary | ICD-10-CM | POA: Diagnosis not present

## 2018-10-30 DIAGNOSIS — H5789 Other specified disorders of eye and adnexa: Secondary | ICD-10-CM | POA: Diagnosis not present

## 2018-10-30 DIAGNOSIS — M150 Primary generalized (osteo)arthritis: Secondary | ICD-10-CM | POA: Diagnosis not present

## 2018-10-30 DIAGNOSIS — E790 Hyperuricemia without signs of inflammatory arthritis and tophaceous disease: Secondary | ICD-10-CM | POA: Diagnosis not present

## 2018-10-30 DIAGNOSIS — Z79899 Other long term (current) drug therapy: Secondary | ICD-10-CM | POA: Diagnosis not present

## 2018-10-30 DIAGNOSIS — M2550 Pain in unspecified joint: Secondary | ICD-10-CM | POA: Diagnosis not present

## 2018-10-30 DIAGNOSIS — M0579 Rheumatoid arthritis with rheumatoid factor of multiple sites without organ or systems involvement: Secondary | ICD-10-CM | POA: Diagnosis not present

## 2018-11-02 ENCOUNTER — Other Ambulatory Visit: Payer: Medicare HMO

## 2018-11-02 ENCOUNTER — Other Ambulatory Visit: Payer: Self-pay

## 2018-11-02 DIAGNOSIS — R6889 Other general symptoms and signs: Secondary | ICD-10-CM | POA: Diagnosis not present

## 2018-11-02 DIAGNOSIS — Z20822 Contact with and (suspected) exposure to covid-19: Secondary | ICD-10-CM

## 2018-11-06 LAB — NOVEL CORONAVIRUS, NAA: SARS-CoV-2, NAA: NOT DETECTED

## 2018-12-06 DIAGNOSIS — E119 Type 2 diabetes mellitus without complications: Secondary | ICD-10-CM | POA: Diagnosis not present

## 2018-12-06 DIAGNOSIS — G4733 Obstructive sleep apnea (adult) (pediatric): Secondary | ICD-10-CM | POA: Diagnosis not present

## 2018-12-06 DIAGNOSIS — I10 Essential (primary) hypertension: Secondary | ICD-10-CM | POA: Diagnosis not present

## 2018-12-06 DIAGNOSIS — R102 Pelvic and perineal pain: Secondary | ICD-10-CM | POA: Diagnosis not present

## 2018-12-13 ENCOUNTER — Other Ambulatory Visit (HOSPITAL_COMMUNITY): Payer: Self-pay | Admitting: Internal Medicine

## 2018-12-13 DIAGNOSIS — Z01419 Encounter for gynecological examination (general) (routine) without abnormal findings: Secondary | ICD-10-CM | POA: Diagnosis not present

## 2018-12-13 DIAGNOSIS — N8111 Cystocele, midline: Secondary | ICD-10-CM | POA: Diagnosis not present

## 2018-12-13 DIAGNOSIS — N952 Postmenopausal atrophic vaginitis: Secondary | ICD-10-CM | POA: Diagnosis not present

## 2018-12-13 DIAGNOSIS — Z124 Encounter for screening for malignant neoplasm of cervix: Secondary | ICD-10-CM | POA: Diagnosis not present

## 2018-12-13 DIAGNOSIS — Z1231 Encounter for screening mammogram for malignant neoplasm of breast: Secondary | ICD-10-CM

## 2018-12-18 DIAGNOSIS — L820 Inflamed seborrheic keratosis: Secondary | ICD-10-CM | POA: Diagnosis not present

## 2018-12-18 DIAGNOSIS — L570 Actinic keratosis: Secondary | ICD-10-CM | POA: Diagnosis not present

## 2018-12-18 DIAGNOSIS — X32XXXA Exposure to sunlight, initial encounter: Secondary | ICD-10-CM | POA: Diagnosis not present

## 2018-12-25 ENCOUNTER — Ambulatory Visit (INDEPENDENT_AMBULATORY_CARE_PROVIDER_SITE_OTHER): Payer: Medicare HMO | Admitting: Otolaryngology

## 2018-12-25 DIAGNOSIS — E1165 Type 2 diabetes mellitus with hyperglycemia: Secondary | ICD-10-CM | POA: Diagnosis not present

## 2018-12-25 DIAGNOSIS — G894 Chronic pain syndrome: Secondary | ICD-10-CM | POA: Diagnosis not present

## 2018-12-25 DIAGNOSIS — I10 Essential (primary) hypertension: Secondary | ICD-10-CM | POA: Diagnosis not present

## 2018-12-25 DIAGNOSIS — E782 Mixed hyperlipidemia: Secondary | ICD-10-CM | POA: Diagnosis not present

## 2018-12-28 ENCOUNTER — Other Ambulatory Visit: Payer: Self-pay

## 2018-12-28 ENCOUNTER — Ambulatory Visit (HOSPITAL_COMMUNITY)
Admission: RE | Admit: 2018-12-28 | Discharge: 2018-12-28 | Disposition: A | Discharge status: Home / Self Care | Payer: Medicare HMO | Source: Ambulatory Visit | Attending: Internal Medicine | Admitting: Internal Medicine

## 2018-12-28 DIAGNOSIS — Z1231 Encounter for screening mammogram for malignant neoplasm of breast: Secondary | ICD-10-CM | POA: Insufficient documentation

## 2019-01-22 ENCOUNTER — Ambulatory Visit (INDEPENDENT_AMBULATORY_CARE_PROVIDER_SITE_OTHER): Payer: Medicare HMO | Admitting: Internal Medicine

## 2019-01-29 DIAGNOSIS — K120 Recurrent oral aphthae: Secondary | ICD-10-CM | POA: Diagnosis not present

## 2019-01-29 DIAGNOSIS — L03313 Cellulitis of chest wall: Secondary | ICD-10-CM | POA: Diagnosis not present

## 2019-01-29 DIAGNOSIS — Z6839 Body mass index (BMI) 39.0-39.9, adult: Secondary | ICD-10-CM | POA: Diagnosis not present

## 2019-01-29 DIAGNOSIS — R519 Headache, unspecified: Secondary | ICD-10-CM | POA: Diagnosis not present

## 2019-01-31 DIAGNOSIS — M150 Primary generalized (osteo)arthritis: Secondary | ICD-10-CM | POA: Diagnosis not present

## 2019-01-31 DIAGNOSIS — M2550 Pain in unspecified joint: Secondary | ICD-10-CM | POA: Diagnosis not present

## 2019-01-31 DIAGNOSIS — E790 Hyperuricemia without signs of inflammatory arthritis and tophaceous disease: Secondary | ICD-10-CM | POA: Diagnosis not present

## 2019-01-31 DIAGNOSIS — Z79899 Other long term (current) drug therapy: Secondary | ICD-10-CM | POA: Diagnosis not present

## 2019-01-31 DIAGNOSIS — M0579 Rheumatoid arthritis with rheumatoid factor of multiple sites without organ or systems involvement: Secondary | ICD-10-CM | POA: Diagnosis not present

## 2019-02-14 DIAGNOSIS — M2550 Pain in unspecified joint: Secondary | ICD-10-CM | POA: Diagnosis not present

## 2019-02-24 DIAGNOSIS — E7849 Other hyperlipidemia: Secondary | ICD-10-CM | POA: Diagnosis not present

## 2019-02-24 DIAGNOSIS — E1129 Type 2 diabetes mellitus with other diabetic kidney complication: Secondary | ICD-10-CM | POA: Diagnosis not present

## 2019-02-24 DIAGNOSIS — E1165 Type 2 diabetes mellitus with hyperglycemia: Secondary | ICD-10-CM | POA: Diagnosis not present

## 2019-02-24 DIAGNOSIS — I10 Essential (primary) hypertension: Secondary | ICD-10-CM | POA: Diagnosis not present

## 2019-03-01 DIAGNOSIS — E119 Type 2 diabetes mellitus without complications: Secondary | ICD-10-CM | POA: Diagnosis not present

## 2019-03-01 DIAGNOSIS — H5200 Hypermetropia, unspecified eye: Secondary | ICD-10-CM | POA: Diagnosis not present

## 2019-04-26 DIAGNOSIS — F329 Major depressive disorder, single episode, unspecified: Secondary | ICD-10-CM | POA: Diagnosis not present

## 2019-04-26 DIAGNOSIS — E1122 Type 2 diabetes mellitus with diabetic chronic kidney disease: Secondary | ICD-10-CM | POA: Diagnosis not present

## 2019-04-26 DIAGNOSIS — M069 Rheumatoid arthritis, unspecified: Secondary | ICD-10-CM | POA: Diagnosis not present

## 2019-04-26 DIAGNOSIS — I129 Hypertensive chronic kidney disease with stage 1 through stage 4 chronic kidney disease, or unspecified chronic kidney disease: Secondary | ICD-10-CM | POA: Diagnosis not present

## 2019-05-10 DIAGNOSIS — Z1389 Encounter for screening for other disorder: Secondary | ICD-10-CM | POA: Diagnosis not present

## 2019-05-10 DIAGNOSIS — E559 Vitamin D deficiency, unspecified: Secondary | ICD-10-CM | POA: Diagnosis not present

## 2019-05-10 DIAGNOSIS — E119 Type 2 diabetes mellitus without complications: Secondary | ICD-10-CM | POA: Diagnosis not present

## 2019-05-10 DIAGNOSIS — Z6838 Body mass index (BMI) 38.0-38.9, adult: Secondary | ICD-10-CM | POA: Diagnosis not present

## 2019-05-10 DIAGNOSIS — M069 Rheumatoid arthritis, unspecified: Secondary | ICD-10-CM | POA: Diagnosis not present

## 2019-05-10 DIAGNOSIS — E7849 Other hyperlipidemia: Secondary | ICD-10-CM | POA: Diagnosis not present

## 2019-05-10 DIAGNOSIS — I10 Essential (primary) hypertension: Secondary | ICD-10-CM | POA: Diagnosis not present

## 2019-05-27 DIAGNOSIS — M1991 Primary osteoarthritis, unspecified site: Secondary | ICD-10-CM | POA: Diagnosis not present

## 2019-05-27 DIAGNOSIS — E1122 Type 2 diabetes mellitus with diabetic chronic kidney disease: Secondary | ICD-10-CM | POA: Diagnosis not present

## 2019-05-27 DIAGNOSIS — N183 Chronic kidney disease, stage 3 unspecified: Secondary | ICD-10-CM | POA: Diagnosis not present

## 2019-05-27 DIAGNOSIS — I129 Hypertensive chronic kidney disease with stage 1 through stage 4 chronic kidney disease, or unspecified chronic kidney disease: Secondary | ICD-10-CM | POA: Diagnosis not present

## 2019-07-06 DIAGNOSIS — K120 Recurrent oral aphthae: Secondary | ICD-10-CM | POA: Diagnosis not present

## 2019-07-06 DIAGNOSIS — E790 Hyperuricemia without signs of inflammatory arthritis and tophaceous disease: Secondary | ICD-10-CM | POA: Diagnosis not present

## 2019-07-06 DIAGNOSIS — M150 Primary generalized (osteo)arthritis: Secondary | ICD-10-CM | POA: Diagnosis not present

## 2019-07-06 DIAGNOSIS — I10 Essential (primary) hypertension: Secondary | ICD-10-CM | POA: Diagnosis not present

## 2019-07-06 DIAGNOSIS — E669 Obesity, unspecified: Secondary | ICD-10-CM | POA: Diagnosis not present

## 2019-07-06 DIAGNOSIS — M0579 Rheumatoid arthritis with rheumatoid factor of multiple sites without organ or systems involvement: Secondary | ICD-10-CM | POA: Diagnosis not present

## 2019-07-06 DIAGNOSIS — M545 Low back pain: Secondary | ICD-10-CM | POA: Diagnosis not present

## 2019-07-06 DIAGNOSIS — E1165 Type 2 diabetes mellitus with hyperglycemia: Secondary | ICD-10-CM | POA: Diagnosis not present

## 2019-07-06 DIAGNOSIS — Z6837 Body mass index (BMI) 37.0-37.9, adult: Secondary | ICD-10-CM | POA: Diagnosis not present

## 2019-07-06 DIAGNOSIS — Z681 Body mass index (BMI) 19 or less, adult: Secondary | ICD-10-CM | POA: Diagnosis not present

## 2019-07-06 DIAGNOSIS — M2550 Pain in unspecified joint: Secondary | ICD-10-CM | POA: Diagnosis not present

## 2019-09-27 DIAGNOSIS — M5441 Lumbago with sciatica, right side: Secondary | ICD-10-CM | POA: Diagnosis not present

## 2019-09-27 DIAGNOSIS — M545 Low back pain: Secondary | ICD-10-CM | POA: Diagnosis not present

## 2019-10-09 DIAGNOSIS — R5383 Other fatigue: Secondary | ICD-10-CM | POA: Diagnosis not present

## 2019-10-09 DIAGNOSIS — M2550 Pain in unspecified joint: Secondary | ICD-10-CM | POA: Diagnosis not present

## 2019-10-09 DIAGNOSIS — M150 Primary generalized (osteo)arthritis: Secondary | ICD-10-CM | POA: Diagnosis not present

## 2019-10-09 DIAGNOSIS — L659 Nonscarring hair loss, unspecified: Secondary | ICD-10-CM | POA: Diagnosis not present

## 2019-10-09 DIAGNOSIS — Z79899 Other long term (current) drug therapy: Secondary | ICD-10-CM | POA: Diagnosis not present

## 2019-10-09 DIAGNOSIS — K760 Fatty (change of) liver, not elsewhere classified: Secondary | ICD-10-CM | POA: Diagnosis not present

## 2019-10-09 DIAGNOSIS — Z6839 Body mass index (BMI) 39.0-39.9, adult: Secondary | ICD-10-CM | POA: Diagnosis not present

## 2019-10-09 DIAGNOSIS — E790 Hyperuricemia without signs of inflammatory arthritis and tophaceous disease: Secondary | ICD-10-CM | POA: Diagnosis not present

## 2019-10-09 DIAGNOSIS — M0579 Rheumatoid arthritis with rheumatoid factor of multiple sites without organ or systems involvement: Secondary | ICD-10-CM | POA: Diagnosis not present

## 2019-11-01 DIAGNOSIS — M5441 Lumbago with sciatica, right side: Secondary | ICD-10-CM | POA: Diagnosis not present

## 2019-11-01 DIAGNOSIS — M545 Low back pain: Secondary | ICD-10-CM | POA: Diagnosis not present

## 2019-11-06 DIAGNOSIS — M545 Low back pain: Secondary | ICD-10-CM | POA: Diagnosis not present

## 2019-11-27 DIAGNOSIS — M545 Low back pain: Secondary | ICD-10-CM | POA: Diagnosis not present

## 2019-11-27 DIAGNOSIS — M5441 Lumbago with sciatica, right side: Secondary | ICD-10-CM | POA: Diagnosis not present

## 2019-12-13 ENCOUNTER — Encounter: Payer: Self-pay | Admitting: Emergency Medicine

## 2019-12-13 ENCOUNTER — Ambulatory Visit
Admission: EM | Admit: 2019-12-13 | Discharge: 2019-12-13 | Disposition: A | Discharge status: Home / Self Care | Payer: Medicare HMO | Attending: Emergency Medicine | Admitting: Emergency Medicine

## 2019-12-13 DIAGNOSIS — M792 Neuralgia and neuritis, unspecified: Secondary | ICD-10-CM

## 2019-12-13 HISTORY — DX: Pure hypercholesterolemia, unspecified: E78.00

## 2019-12-13 MED ORDER — CYCLOBENZAPRINE HCL 10 MG PO TABS: 10.0000 mg | ORAL_TABLET | Freq: Two times a day (BID) | ORAL | 0 refills | Status: DC | PRN

## 2019-12-13 NOTE — Discharge Instructions (Addendum)
Flexeril was prescribed take as directed/do not drive or work.  Machinery while taking this medication  Follow-up with PCP for reevaluation Return or go to ED for worsening of symptoms

## 2019-12-13 NOTE — ED Provider Notes (Signed)
[place]   [id number] 12/13/19 Arrival Time: 60   Chief Complaint  Patient presents with  . Foot Pain     SUBJECTIVE: History from: patient.  Alexandra [name] is a 74 y.o. female who presents to the urgent care for complaint of burning pain to her left foot that started 3 hours ago.[name]  Described the pain as feeling like  1000 bee sting.  Localized the pain to the left foot.  He describes the pain as constant and achy.  He has tried gabapentin and Zanaflex without relief.  His symptoms are made worse with ROM.  He denies similar symptoms in the past.     ROS: As per HPI.  All other pertinent ROS negative.      Past Medical History:  Diagnosis Date  . Depression   . Gout   . High cholesterol   . Hypertension    Past Surgical History:  Procedure Laterality Date  . CARPAL TUNNEL RELEASE    . CHOLECYSTECTOMY    . REPLACEMENT TOTAL KNEE     Allergies  Allergen Reactions  . Codeine Nausea And Vomiting  . Naproxen Itching   No current facility-administered medications on file prior to encounter.   Current Outpatient Medications on File Prior to Encounter  Medication Sig Dispense Refill  . atorvastatin (LIPITOR) 20 MG tablet Take 20 mg by mouth daily.    . Coenzyme Q10 (COQ10) 100 MG CAPS Take by mouth every other day.     . Ferrous Sulfate (FEROSUL PO) Take by mouth daily.     . folic acid (FOLVITE) 1 MG tablet Take by mouth daily.    [name] lamoTRIgine (LAMICTAL) 150 MG tablet Take 150 mg by mouth daily.    [name] losartan (COZAAR) 50 MG tablet Take 50 mg by mouth daily.    . metFORMIN (GLUCOPHAGE) 500 MG tablet Take by mouth daily.    . methotrexate (50 MG/ML) 1 g injection Inject 20 mg into the vein once a week. SQ    . metoprolol succinate (TOPROL-XL) 50 MG 24 hr tablet Take 50 mg by mouth daily. Take with or immediately following a meal.    . omeprazole (PRILOSEC) 40 MG capsule Take 40 mg by mouth at bedtime.     Social History   Socioeconomic History  .  Marital status: Widowed    Spouse name: Not on file  . Number of children: Not on file  . Years of education: Not on file  . Highest education level: Not on file  Occupational History  . Not on file  Tobacco Use  . Smoking status: Never Smoker  . Smokeless tobacco: Never Used  Substance and Sexual Activity  . Alcohol use: Yes    Comment: rarely  . Drug use: Never  . Sexual activity: Not on file  Other Topics Concern  . Not on file  Social History Narrative  . Not on file   Social Determinants of Health   Financial Resource Strain:   . Difficulty of Paying Living Expenses: Not on file  Food Insecurity:   . Worried About Charity fundraiser in the Last Year: Not on file  . Ran Out of Food in the Last Year: Not on file  Transportation Needs:   . Lack of Transportation (Medical): Not on file  . Lack of Transportation (Non-Medical): Not on file  Physical Activity:   . Days of Exercise per Week: Not on file  . Minutes of Exercise per Session: Not on  file  Stress:   . Feeling of Stress : Not on file  Social Connections:   . Frequency of Communication with Friends and Family: Not on file  . Frequency of Social Gatherings with Friends and Family: Not on file  . Attends Religious Services: Not on file  . Active Member of Clubs or Organizations: Not on file  . Attends Archivist Meetings: Not on file  . Marital Status: Not on file  Intimate Partner Violence:   . Fear of Current or Ex-Partner: Not on file  . Emotionally Abused: Not on file  . Physically Abused: Not on file  . Sexually Abused: Not on file   No family history on file.  OBJECTIVE:  Vitals:   12/13/19 1433 12/13/19 1438  BP:  (!) 146/77  Pulse:  72  Resp:  18  Temp:  98.5 F (36.9 C)  TempSrc:  Oral  SpO2:  98%  Weight: 215 lb (97.5 kg)   Height: 5\' 1"  (1.549 m)      Physical Exam Vitals and nursing note reviewed.  Constitutional:      General: She is not in acute distress.     Appearance: Normal appearance. She is normal weight. She is not ill-appearing, toxic-appearing or diaphoretic.  HENT:     Head: Normocephalic.  Cardiovascular:     Rate and Rhythm: Normal rate and regular rhythm.     Pulses: Normal pulses.     Heart sounds: Normal heart sounds. No murmur heard.  No friction rub. No gallop.   Pulmonary:     Effort: Pulmonary effort is normal. No respiratory distress.     Breath sounds: Normal breath sounds. No stridor. No wheezing, rhonchi or rales.  Chest:     Chest wall: No tenderness.  Musculoskeletal:        General: Tenderness present. No swelling or deformity.     Comments: Burning pain at the sole of left foot  Neurological:     General: No focal deficit present.     Mental Status: She is alert and oriented to person, place, and time.     GCS: GCS eye subscore is 4. GCS verbal subscore is 5. GCS motor subscore is 6.     Cranial Nerves: Cranial nerves are intact. No cranial nerve deficit.     Sensory: Sensation is intact.     Motor: Motor function is intact. No weakness.     Coordination: Coordination is intact. Coordination normal.     Gait: Gait is intact. Gait normal.     Deep Tendon Reflexes: Reflexes normal.      LABS:  No results found for this or any previous visit (from the past 24 hour(s)).   ASSESSMENT & PLAN:  1. Neuropathic pain     Meds ordered this encounter  Medications  . cyclobenzaprine (FLEXERIL) 10 MG tablet    Sig: Take 1 tablet (10 mg total) by mouth 2 (two) times daily as needed for muscle spasms.    Dispense:  20 tablet    Refill:  0   Patient is stable at discharge.  She was advised to follow PCP to have her A1c check.  States she  stopped taking of Metformin at the direction of her PCP.  Her symptoms is likely a neuropathic pain caused by diabetes  Discharge Instructions Flexeril was prescribed take as directed Follow-up with PCP for reevaluation Return or go to ED for worsening of symptoms  Reviewed  expectations re: course of current medical issues.  Questions answered. Outlined signs and symptoms indicating need for more acute intervention. Patient verbalized understanding. After Visit Summary given.     Note: This document was prepared using Dragon voice recognition software and may include unintentional dictation errors.     Emerson Monte, FNP 12/13/19 1500

## 2019-12-13 NOTE — ED Triage Notes (Signed)
Burning pain to LT foot that started x 3 hours ago.  Pt denies any injury.  States it felt like she was stung by 1000 bees.  Has never had a pain like this in the past.

## 2019-12-17 DIAGNOSIS — M48061 Spinal stenosis, lumbar region without neurogenic claudication: Secondary | ICD-10-CM | POA: Diagnosis not present

## 2020-01-10 DIAGNOSIS — M48061 Spinal stenosis, lumbar region without neurogenic claudication: Secondary | ICD-10-CM | POA: Diagnosis not present

## 2020-01-16 DIAGNOSIS — Z6841 Body Mass Index (BMI) 40.0 and over, adult: Secondary | ICD-10-CM | POA: Diagnosis not present

## 2020-01-16 DIAGNOSIS — M2550 Pain in unspecified joint: Secondary | ICD-10-CM | POA: Diagnosis not present

## 2020-01-16 DIAGNOSIS — M150 Primary generalized (osteo)arthritis: Secondary | ICD-10-CM | POA: Diagnosis not present

## 2020-01-16 DIAGNOSIS — E790 Hyperuricemia without signs of inflammatory arthritis and tophaceous disease: Secondary | ICD-10-CM | POA: Diagnosis not present

## 2020-01-16 DIAGNOSIS — Z79899 Other long term (current) drug therapy: Secondary | ICD-10-CM | POA: Diagnosis not present

## 2020-01-16 DIAGNOSIS — M0579 Rheumatoid arthritis with rheumatoid factor of multiple sites without organ or systems involvement: Secondary | ICD-10-CM | POA: Diagnosis not present

## 2020-01-25 DIAGNOSIS — M48061 Spinal stenosis, lumbar region without neurogenic claudication: Secondary | ICD-10-CM | POA: Diagnosis not present

## 2020-01-25 DIAGNOSIS — M47816 Spondylosis without myelopathy or radiculopathy, lumbar region: Secondary | ICD-10-CM | POA: Diagnosis not present

## 2020-02-06 DIAGNOSIS — M47816 Spondylosis without myelopathy or radiculopathy, lumbar region: Secondary | ICD-10-CM | POA: Diagnosis not present

## 2020-02-12 DIAGNOSIS — M5030 Other cervical disc degeneration, unspecified cervical region: Secondary | ICD-10-CM | POA: Diagnosis not present

## 2020-02-12 DIAGNOSIS — E7849 Other hyperlipidemia: Secondary | ICD-10-CM | POA: Diagnosis not present

## 2020-02-12 DIAGNOSIS — E1165 Type 2 diabetes mellitus with hyperglycemia: Secondary | ICD-10-CM | POA: Diagnosis not present

## 2020-02-12 DIAGNOSIS — E119 Type 2 diabetes mellitus without complications: Secondary | ICD-10-CM | POA: Diagnosis not present

## 2020-02-12 DIAGNOSIS — F419 Anxiety disorder, unspecified: Secondary | ICD-10-CM | POA: Diagnosis not present

## 2020-02-12 DIAGNOSIS — Z1389 Encounter for screening for other disorder: Secondary | ICD-10-CM | POA: Diagnosis not present

## 2020-02-12 DIAGNOSIS — Z0001 Encounter for general adult medical examination with abnormal findings: Secondary | ICD-10-CM | POA: Diagnosis not present

## 2020-02-12 DIAGNOSIS — Z6841 Body Mass Index (BMI) 40.0 and over, adult: Secondary | ICD-10-CM | POA: Diagnosis not present

## 2020-02-12 DIAGNOSIS — I10 Essential (primary) hypertension: Secondary | ICD-10-CM | POA: Diagnosis not present

## 2020-02-12 DIAGNOSIS — Z23 Encounter for immunization: Secondary | ICD-10-CM | POA: Diagnosis not present

## 2020-02-12 DIAGNOSIS — G894 Chronic pain syndrome: Secondary | ICD-10-CM | POA: Diagnosis not present

## 2020-03-10 DIAGNOSIS — M48061 Spinal stenosis, lumbar region without neurogenic claudication: Secondary | ICD-10-CM | POA: Diagnosis not present

## 2020-03-27 DIAGNOSIS — E1129 Type 2 diabetes mellitus with other diabetic kidney complication: Secondary | ICD-10-CM | POA: Diagnosis not present

## 2020-03-27 DIAGNOSIS — L0100 Impetigo, unspecified: Secondary | ICD-10-CM | POA: Diagnosis not present

## 2020-03-27 DIAGNOSIS — Z6841 Body Mass Index (BMI) 40.0 and over, adult: Secondary | ICD-10-CM | POA: Diagnosis not present

## 2020-04-01 DIAGNOSIS — M48062 Spinal stenosis, lumbar region with neurogenic claudication: Secondary | ICD-10-CM | POA: Diagnosis not present

## 2020-04-23 DIAGNOSIS — Z79899 Other long term (current) drug therapy: Secondary | ICD-10-CM | POA: Diagnosis not present

## 2020-04-23 DIAGNOSIS — M150 Primary generalized (osteo)arthritis: Secondary | ICD-10-CM | POA: Diagnosis not present

## 2020-04-23 DIAGNOSIS — M2550 Pain in unspecified joint: Secondary | ICD-10-CM | POA: Diagnosis not present

## 2020-04-23 DIAGNOSIS — M0579 Rheumatoid arthritis with rheumatoid factor of multiple sites without organ or systems involvement: Secondary | ICD-10-CM | POA: Diagnosis not present

## 2020-04-23 DIAGNOSIS — M48061 Spinal stenosis, lumbar region without neurogenic claudication: Secondary | ICD-10-CM | POA: Diagnosis not present

## 2020-04-23 DIAGNOSIS — E669 Obesity, unspecified: Secondary | ICD-10-CM | POA: Diagnosis not present

## 2020-04-23 DIAGNOSIS — Z6839 Body mass index (BMI) 39.0-39.9, adult: Secondary | ICD-10-CM | POA: Diagnosis not present

## 2020-05-05 DIAGNOSIS — M48062 Spinal stenosis, lumbar region with neurogenic claudication: Secondary | ICD-10-CM | POA: Diagnosis not present

## 2020-05-26 DIAGNOSIS — R7989 Other specified abnormal findings of blood chemistry: Secondary | ICD-10-CM | POA: Diagnosis not present

## 2020-05-29 DIAGNOSIS — H31001 Unspecified chorioretinal scars, right eye: Secondary | ICD-10-CM | POA: Diagnosis not present

## 2020-05-29 DIAGNOSIS — Z961 Presence of intraocular lens: Secondary | ICD-10-CM | POA: Diagnosis not present

## 2020-05-29 DIAGNOSIS — H04123 Dry eye syndrome of bilateral lacrimal glands: Secondary | ICD-10-CM | POA: Diagnosis not present

## 2020-05-29 DIAGNOSIS — H5200 Hypermetropia, unspecified eye: Secondary | ICD-10-CM | POA: Diagnosis not present

## 2020-06-16 DIAGNOSIS — Z0100 Encounter for examination of eyes and vision without abnormal findings: Secondary | ICD-10-CM | POA: Diagnosis not present

## 2020-07-12 IMAGING — RF DG ESOPHAGUS
7 series · 13 of 24 positions shown · non-contrast
Comparison: 05/20/2008

CLINICAL DATA: GERD, wheezing, constant phlegm

EXAM:
ESOPHOGRAM / BARIUM SWALLOW / BARIUM TABLET STUDY
TECHNIQUE: Combined double contrast and single contrast examination performed
using effervescent crystals, thick barium liquid, and thin barium
liquid. The patient was observed with fluoroscopy swallowing a 13 mm
barium sulphate tablet.
FLUOROSCOPY TIME:  Fluoroscopy Time:  1 minutes 18 seconds
Radiation Exposure Index (if provided by the fluoroscopic device):
26.5 mGy
Number of Acquired Spot Images: None

[Series 1: cp_standard · 0.18mm/px · 2 of 48 frames shown (1 of 7)]
[frame 8/48]
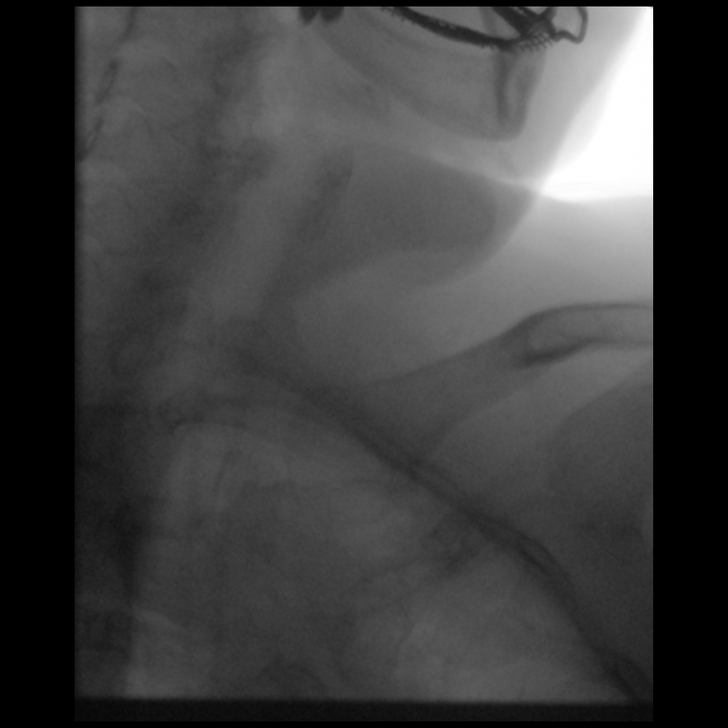
[frame 31/48]
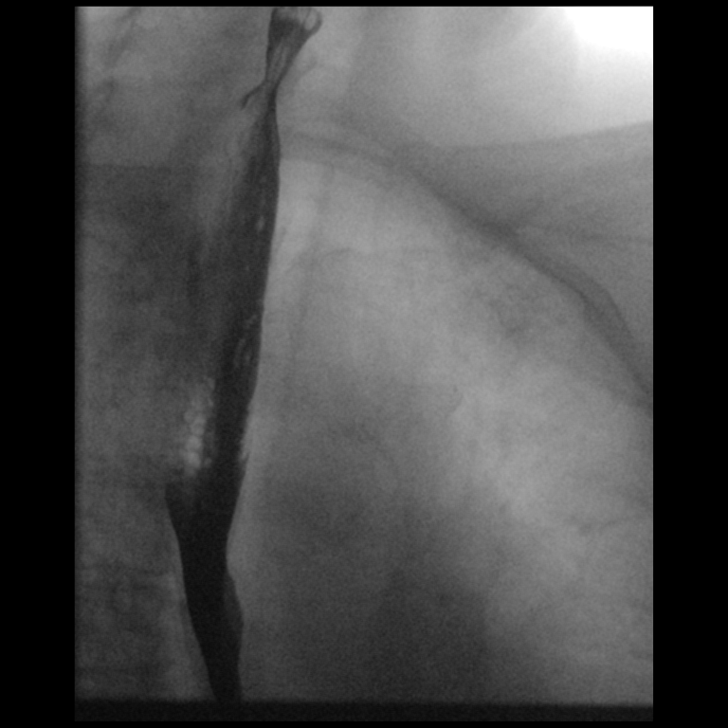

[Series 2: cp_standard · 0.18mm/px · 2 of 129 frames shown (2 of 7)]
[frame 20/129]
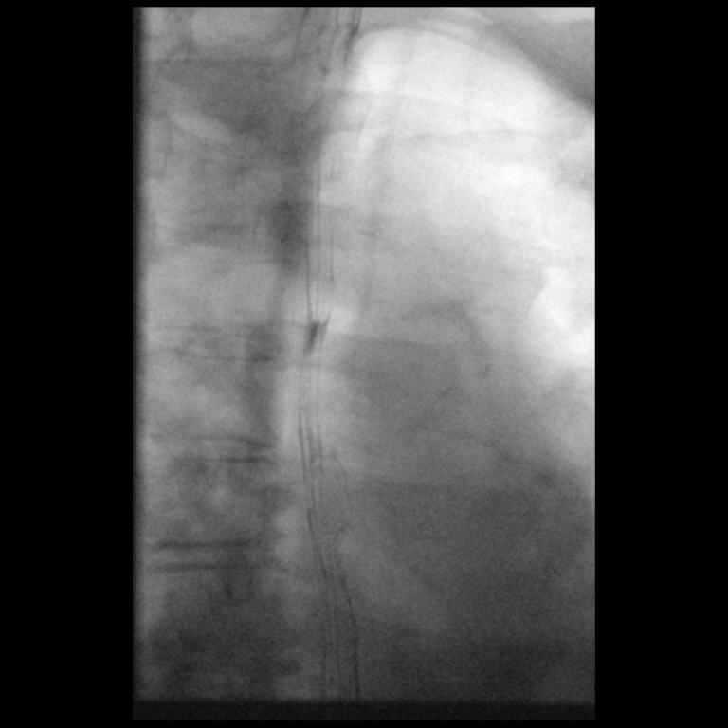
[frame 110/129]
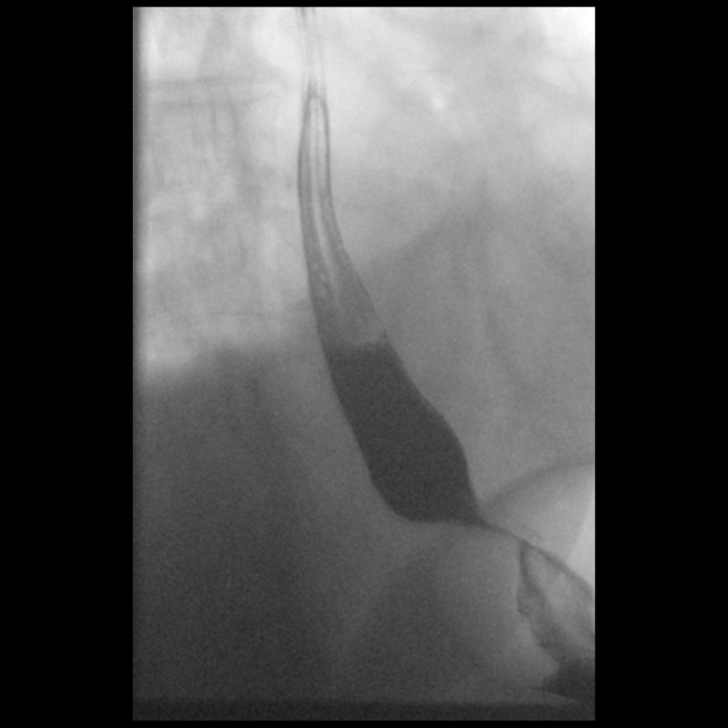

[Series 3: cp_standard · 0.18mm/px · 1 of 136 frames shown (3 of 7)]
[frame 69/136]
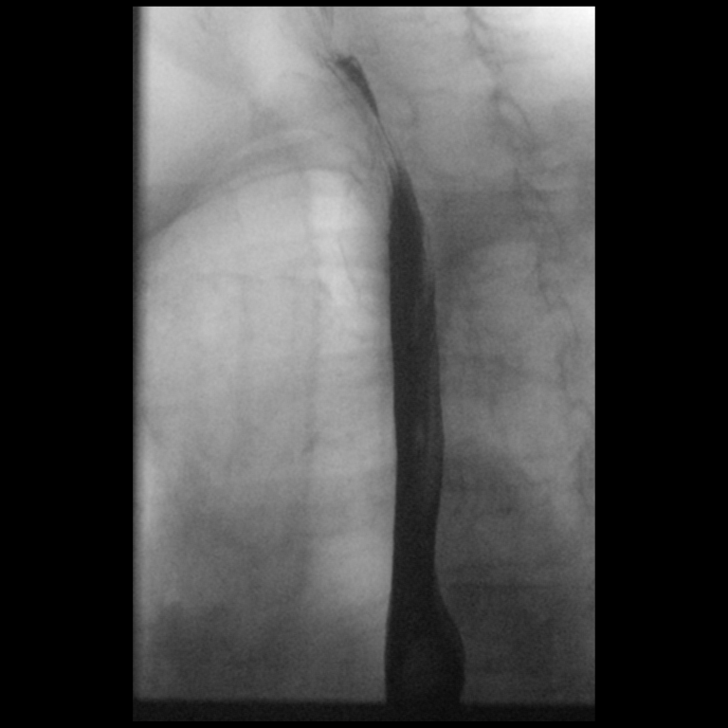

[Series 4: cp_standard · 0.28mm/px · 3 of 156 frames shown (4 of 7)]
[frame 24/156]
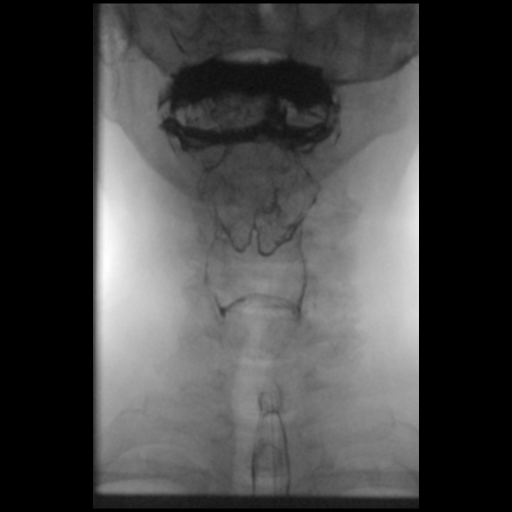
[frame 124/156]
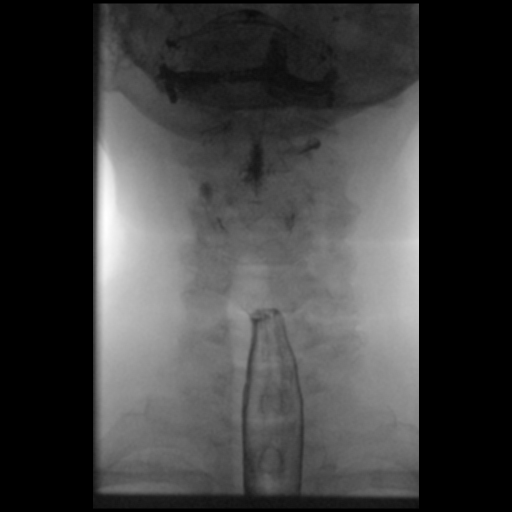
[frame 133/156]
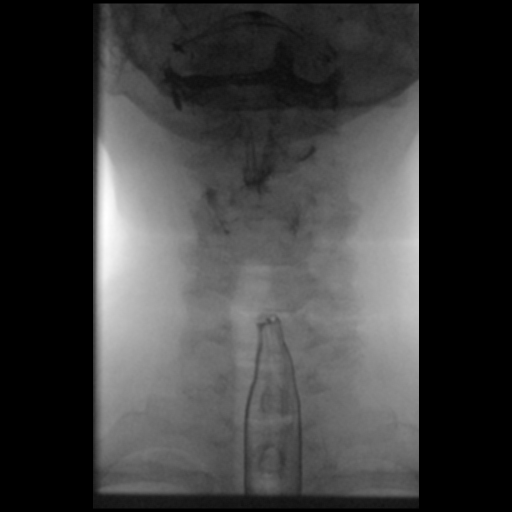

[Series 5: cp_standard · 0.26mm/px · 1 of 111 frames shown (5 of 7)]
[frame 56/111]
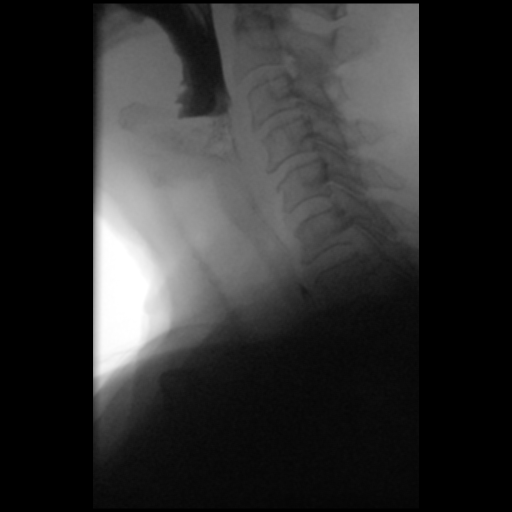

[Series 6: cp_standard · 0.26mm/px · 2 of 100 frames shown (6 of 7)]
[frame 16/100]
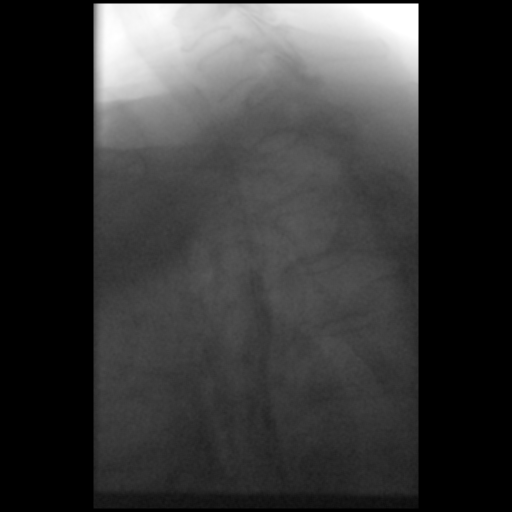
[frame 86/100]
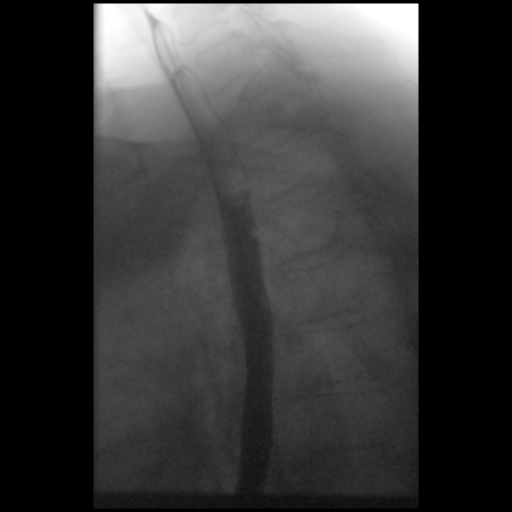

[Series 7: cp_standard · 0.19mm/px · 2 of 290 frames shown (7 of 7)]
[frame 44/290]
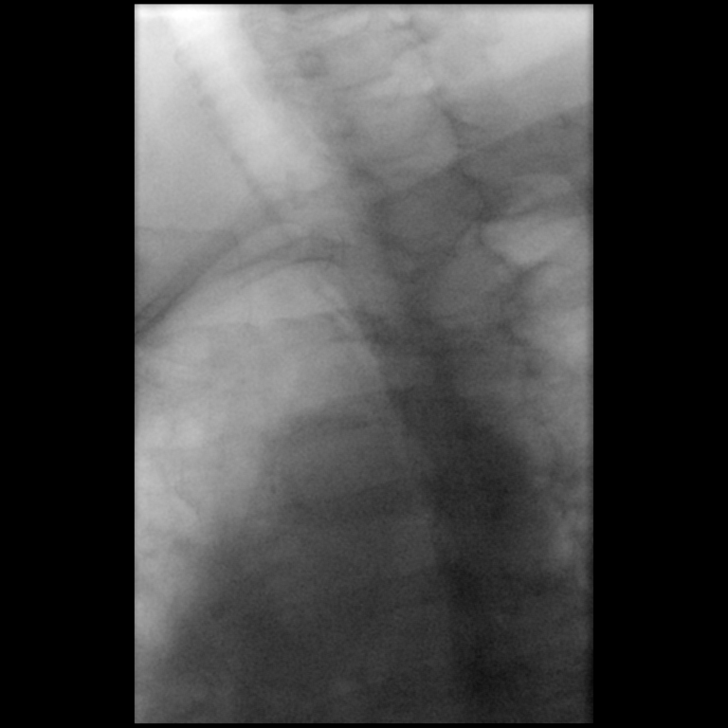
[frame 247/290]
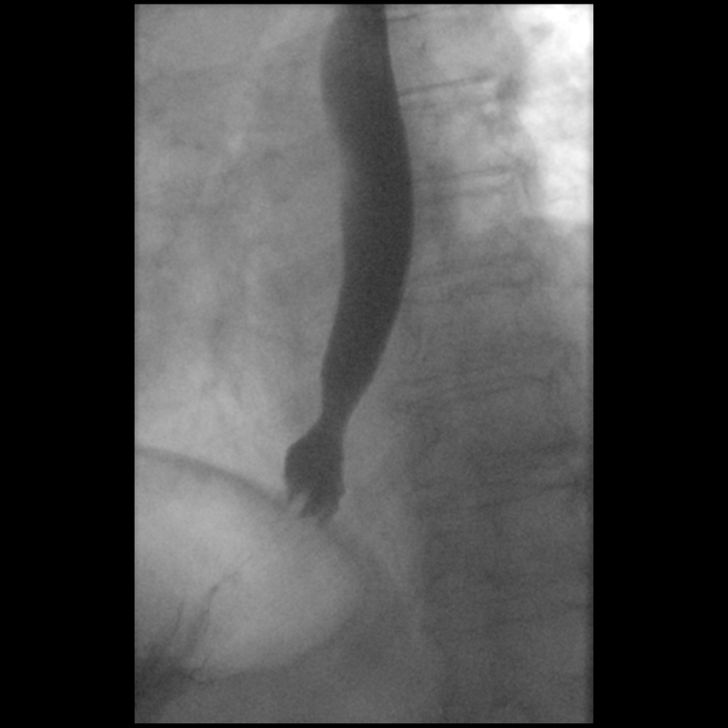

[13 of 24 positions shown; findings below may reference images not displayed]

FINDINGS: Esophageal distention: Normal without mass or stricture

Filling defects:  None

12.5 mm barium tablet: Easily passed from oral cavity to stomach
without delay

Motility:  Age-appropriate mild esophageal dysmotility

Mucosa:  Smooth without irregularity or ulceration

Hypopharynx/cervical esophagus: Again seen slight underfilling of
the LEFT piriform sinus versus RIGHT, though less pronounced than on
previous exam. No laryngeal penetration or aspiration.

Hiatal hernia:  Absent

GE reflux:  Not seen during exam

Other:  N/A
IMPRESSION: No significant esophageal abnormalities identified.

Slight underfilling of the LEFT piriform sinus less pronounced than
on previous exam from 2252, doubtful significance.

## 2020-08-20 DIAGNOSIS — Z79899 Other long term (current) drug therapy: Secondary | ICD-10-CM | POA: Diagnosis not present

## 2020-08-20 DIAGNOSIS — M150 Primary generalized (osteo)arthritis: Secondary | ICD-10-CM | POA: Diagnosis not present

## 2020-08-20 DIAGNOSIS — E669 Obesity, unspecified: Secondary | ICD-10-CM | POA: Diagnosis not present

## 2020-08-20 DIAGNOSIS — M2550 Pain in unspecified joint: Secondary | ICD-10-CM | POA: Diagnosis not present

## 2020-08-20 DIAGNOSIS — M0579 Rheumatoid arthritis with rheumatoid factor of multiple sites without organ or systems involvement: Secondary | ICD-10-CM | POA: Diagnosis not present

## 2020-08-20 DIAGNOSIS — E119 Type 2 diabetes mellitus without complications: Secondary | ICD-10-CM | POA: Diagnosis not present

## 2020-08-20 DIAGNOSIS — Z6836 Body mass index (BMI) 36.0-36.9, adult: Secondary | ICD-10-CM | POA: Diagnosis not present

## 2020-08-20 DIAGNOSIS — M48061 Spinal stenosis, lumbar region without neurogenic claudication: Secondary | ICD-10-CM | POA: Diagnosis not present

## 2020-11-19 DIAGNOSIS — M0579 Rheumatoid arthritis with rheumatoid factor of multiple sites without organ or systems involvement: Secondary | ICD-10-CM | POA: Diagnosis not present

## 2020-11-19 DIAGNOSIS — Z79899 Other long term (current) drug therapy: Secondary | ICD-10-CM | POA: Diagnosis not present

## 2021-01-14 DIAGNOSIS — M48062 Spinal stenosis, lumbar region with neurogenic claudication: Secondary | ICD-10-CM | POA: Diagnosis not present

## 2021-02-19 DIAGNOSIS — Z79899 Other long term (current) drug therapy: Secondary | ICD-10-CM | POA: Diagnosis not present

## 2021-02-19 DIAGNOSIS — Z6836 Body mass index (BMI) 36.0-36.9, adult: Secondary | ICD-10-CM | POA: Diagnosis not present

## 2021-02-19 DIAGNOSIS — E669 Obesity, unspecified: Secondary | ICD-10-CM | POA: Diagnosis not present

## 2021-02-19 DIAGNOSIS — M150 Primary generalized (osteo)arthritis: Secondary | ICD-10-CM | POA: Diagnosis not present

## 2021-02-19 DIAGNOSIS — M0579 Rheumatoid arthritis with rheumatoid factor of multiple sites without organ or systems involvement: Secondary | ICD-10-CM | POA: Diagnosis not present

## 2021-02-19 DIAGNOSIS — M2550 Pain in unspecified joint: Secondary | ICD-10-CM | POA: Diagnosis not present

## 2021-03-04 DIAGNOSIS — Z23 Encounter for immunization: Secondary | ICD-10-CM | POA: Diagnosis not present

## 2021-03-04 DIAGNOSIS — E669 Obesity, unspecified: Secondary | ICD-10-CM | POA: Diagnosis not present

## 2021-03-04 DIAGNOSIS — Z0000 Encounter for general adult medical examination without abnormal findings: Secondary | ICD-10-CM | POA: Diagnosis not present

## 2021-03-04 DIAGNOSIS — M069 Rheumatoid arthritis, unspecified: Secondary | ICD-10-CM | POA: Diagnosis not present

## 2021-03-04 DIAGNOSIS — M159 Polyosteoarthritis, unspecified: Secondary | ICD-10-CM | POA: Diagnosis not present

## 2021-03-04 DIAGNOSIS — Z6835 Body mass index (BMI) 35.0-35.9, adult: Secondary | ICD-10-CM | POA: Diagnosis not present

## 2021-03-04 DIAGNOSIS — Z1331 Encounter for screening for depression: Secondary | ICD-10-CM | POA: Diagnosis not present

## 2021-03-12 DIAGNOSIS — M79645 Pain in left finger(s): Secondary | ICD-10-CM | POA: Diagnosis not present

## 2021-03-12 DIAGNOSIS — M65341 Trigger finger, right ring finger: Secondary | ICD-10-CM | POA: Diagnosis not present

## 2021-04-05 ENCOUNTER — Other Ambulatory Visit: Payer: Self-pay

## 2021-04-05 ENCOUNTER — Encounter: Payer: Self-pay | Admitting: Emergency Medicine

## 2021-04-05 ENCOUNTER — Ambulatory Visit
Admission: EM | Admit: 2021-04-05 | Discharge: 2021-04-05 | Disposition: A | Discharge status: Home / Self Care | Payer: Medicare HMO | Attending: Urgent Care | Admitting: Urgent Care

## 2021-04-05 ENCOUNTER — Ambulatory Visit (INDEPENDENT_AMBULATORY_CARE_PROVIDER_SITE_OTHER): Payer: Medicare HMO

## 2021-04-05 DIAGNOSIS — U071 COVID-19: Secondary | ICD-10-CM

## 2021-04-05 DIAGNOSIS — R059 Cough, unspecified: Secondary | ICD-10-CM

## 2021-04-05 DIAGNOSIS — J439 Emphysema, unspecified: Secondary | ICD-10-CM | POA: Diagnosis not present

## 2021-04-05 DIAGNOSIS — Z87891 Personal history of nicotine dependence: Secondary | ICD-10-CM

## 2021-04-05 DIAGNOSIS — R0989 Other specified symptoms and signs involving the circulatory and respiratory systems: Secondary | ICD-10-CM

## 2021-04-05 DIAGNOSIS — R051 Acute cough: Secondary | ICD-10-CM

## 2021-04-05 MED ORDER — ALBUTEROL SULFATE HFA 108 (90 BASE) MCG/ACT IN AERS: 1.0000 | INHALATION_SPRAY | Freq: Four times a day (QID) | RESPIRATORY_TRACT | 0 refills | Status: AC | PRN

## 2021-04-05 NOTE — ED Triage Notes (Signed)
Patient states that she tested positive for COVID 7 days ago.  Today she did another test, still positive.  Chest congestion, fatigue, productive cough, low grade fever.  Patient denies any OTC meds.  Patient is vaccinated for COVID.

## 2021-04-05 NOTE — ED Provider Notes (Signed)
[facility]   MRN: [id number] DOB: 04-01-1946  Subjective:   Alexandra [name] is a 75 y.o. female presenting for 1 week history of having COVID-19.  She has concerns about having pneumonia.  Feels like she has chest congestion and is coughing.  Reports that a few days ago she felt like she was going to die but no longer feels that way.  She continues to have fatigue, productive cough and low-grade fevers.  Has been using over-the-counter medications with minimal relief.  No history of respiratory disorders.  She is not a smoker but has extensive history of smoking. She quit ~25 years ago.   No current facility-administered medications for this encounter.  Current Outpatient Medications:    atorvastatin (LIPITOR) 20 MG tablet, Take 20 mg by mouth daily., Disp: , Rfl:    Coenzyme Q10 (COQ10) 100 MG CAPS, Take by mouth every other day. , Disp: , Rfl:    cyclobenzaprine (FLEXERIL) 10 MG tablet, Take 1 tablet (10 mg total) by mouth 2 (two) times daily as needed for muscle spasms., Disp: 20 tablet, Rfl: 0   Ferrous Sulfate (FEROSUL PO), Take by mouth daily. , Disp: , Rfl:    folic acid (FOLVITE) 1 MG tablet, Take by mouth daily., Disp: , Rfl:    lamoTRIgine (LAMICTAL) 150 MG tablet, Take 150 mg by mouth daily., Disp: , Rfl:    losartan (COZAAR) 50 MG tablet, Take 50 mg by mouth daily., Disp: , Rfl:    metFORMIN (GLUCOPHAGE) 500 MG tablet, Take by mouth daily., Disp: , Rfl:    methotrexate (50 MG/ML) 1 g injection, Inject 20 mg into the vein once a week. SQ, Disp: , Rfl:    metoprolol succinate (TOPROL-XL) 50 MG 24 hr tablet, Take 50 mg by mouth daily. Take with or immediately following a meal., Disp: , Rfl:    omeprazole (PRILOSEC) 40 MG capsule, Take 40 mg by mouth at bedtime., Disp: , Rfl:    Allergies  Allergen Reactions   Codeine Nausea And Vomiting   Naproxen Itching    Past Medical History:  Diagnosis Date   Depression    Gout    High cholesterol    Hypertension       Past Surgical History:  Procedure Laterality Date   CARPAL TUNNEL RELEASE     CHOLECYSTECTOMY     REPLACEMENT TOTAL KNEE      No family history on file.  Social History   Tobacco Use   Smoking status: Never   Smokeless tobacco: Never  Substance Use Topics   Alcohol use: Yes    Comment: rarely   Drug use: Never    ROS   Objective:   Vitals: BP 105/68 (BP Location: Right Arm)   Pulse 73   Temp 99 F (37.2 C) (Oral)   SpO2 93%   Physical Exam Constitutional:      General: She is not in acute distress.    Appearance: Normal appearance. She is well-developed. She is not ill-appearing, toxic-appearing or diaphoretic.  HENT:     Head: Normocephalic and atraumatic.     Nose: Nose normal.     Mouth/Throat:     Mouth: Mucous membranes are moist.  Eyes:     Extraocular Movements: Extraocular movements intact.     Pupils: Pupils are equal, round, and reactive to light.  Cardiovascular:     Rate and Rhythm: Normal rate and regular rhythm.     Pulses: Normal pulses.     Heart sounds:  Normal heart sounds. No murmur heard.   No friction rub. No gallop.  Pulmonary:     Effort: Pulmonary effort is normal. No respiratory distress.     Breath sounds: Normal breath sounds. No stridor. No wheezing, rhonchi or rales.  Skin:    General: Skin is warm and dry.     Findings: No rash.  Neurological:     Mental Status: She is alert and oriented to person, place, and time.  Psychiatric:        Mood and Affect: Mood normal.        Behavior: Behavior normal.        Thought Content: Thought content normal.    DG Chest 2 View  Result Date: 04/05/2021 CLINICAL DATA:  Productive cough, COVID-19 EXAM: CHEST - 2 VIEW COMPARISON:  05/08/2007 FINDINGS: Frontal and lateral views of the chest demonstrate an unremarkable cardiac silhouette. No airspace disease, effusion, or pneumothorax. Hyperinflation and interstitial prominence compatible with emphysema. No acute bony abnormalities.  IMPRESSION: 1. Background emphysema.  No acute airspace disease. Electronically Signed   By: Randa Ngo M.D.   On: 04/05/2021 15:17     Assessment and Plan :   PDMP not reviewed this encounter.  1. COVID-19   2. Chest congestion   3. Acute cough   4. History of smoking    Recommended an albuterol inhaler.  Reviewed chest x-ray changes in her lungs reflecting her history of smoking.  Follow-up with PCP.  For now use supportive care for her COVID infection.  Low suspicion for pulmonary embolism related to her COVID infection.  Recommended strict ER precautions. Counseled patient on potential for adverse effects with medications prescribed/recommended today, ER and return-to-clinic precautions discussed, patient verbalized understanding.    Jaynee Eagles, PA-C 04/05/21 1550

## 2021-05-04 DIAGNOSIS — M65331 Trigger finger, right middle finger: Secondary | ICD-10-CM | POA: Diagnosis not present

## 2021-05-04 DIAGNOSIS — M1812 Unilateral primary osteoarthritis of first carpometacarpal joint, left hand: Secondary | ICD-10-CM | POA: Diagnosis not present

## 2021-06-15 DIAGNOSIS — E559 Vitamin D deficiency, unspecified: Secondary | ICD-10-CM | POA: Diagnosis not present

## 2021-06-15 DIAGNOSIS — Z0001 Encounter for general adult medical examination with abnormal findings: Secondary | ICD-10-CM | POA: Diagnosis not present

## 2021-06-15 DIAGNOSIS — E119 Type 2 diabetes mellitus without complications: Secondary | ICD-10-CM | POA: Diagnosis not present

## 2021-08-12 DIAGNOSIS — H5200 Hypermetropia, unspecified eye: Secondary | ICD-10-CM | POA: Diagnosis not present

## 2021-08-20 DIAGNOSIS — Z79899 Other long term (current) drug therapy: Secondary | ICD-10-CM | POA: Diagnosis not present

## 2021-08-20 DIAGNOSIS — M0579 Rheumatoid arthritis with rheumatoid factor of multiple sites without organ or systems involvement: Secondary | ICD-10-CM | POA: Diagnosis not present

## 2021-08-20 DIAGNOSIS — R5383 Other fatigue: Secondary | ICD-10-CM | POA: Diagnosis not present

## 2021-08-20 DIAGNOSIS — E669 Obesity, unspecified: Secondary | ICD-10-CM | POA: Diagnosis not present

## 2021-08-20 DIAGNOSIS — M1991 Primary osteoarthritis, unspecified site: Secondary | ICD-10-CM | POA: Diagnosis not present

## 2021-08-20 DIAGNOSIS — M48061 Spinal stenosis, lumbar region without neurogenic claudication: Secondary | ICD-10-CM | POA: Diagnosis not present

## 2021-08-20 DIAGNOSIS — Z6835 Body mass index (BMI) 35.0-35.9, adult: Secondary | ICD-10-CM | POA: Diagnosis not present

## 2021-08-20 DIAGNOSIS — K760 Fatty (change of) liver, not elsewhere classified: Secondary | ICD-10-CM | POA: Diagnosis not present

## 2021-09-01 DIAGNOSIS — M48062 Spinal stenosis, lumbar region with neurogenic claudication: Secondary | ICD-10-CM | POA: Diagnosis not present

## 2021-09-05 DIAGNOSIS — M65341 Trigger finger, right ring finger: Secondary | ICD-10-CM | POA: Diagnosis not present

## 2021-09-14 DIAGNOSIS — M65341 Trigger finger, right ring finger: Secondary | ICD-10-CM | POA: Diagnosis not present

## 2021-11-25 DIAGNOSIS — N183 Chronic kidney disease, stage 3 unspecified: Secondary | ICD-10-CM | POA: Diagnosis not present

## 2021-11-25 DIAGNOSIS — M0579 Rheumatoid arthritis with rheumatoid factor of multiple sites without organ or systems involvement: Secondary | ICD-10-CM | POA: Diagnosis not present

## 2021-11-25 DIAGNOSIS — L03818 Cellulitis of other sites: Secondary | ICD-10-CM | POA: Diagnosis not present

## 2021-11-25 DIAGNOSIS — E1165 Type 2 diabetes mellitus with hyperglycemia: Secondary | ICD-10-CM | POA: Diagnosis not present

## 2021-11-25 DIAGNOSIS — Z6836 Body mass index (BMI) 36.0-36.9, adult: Secondary | ICD-10-CM | POA: Diagnosis not present

## 2021-11-25 DIAGNOSIS — E6609 Other obesity due to excess calories: Secondary | ICD-10-CM | POA: Diagnosis not present

## 2021-11-25 DIAGNOSIS — M069 Rheumatoid arthritis, unspecified: Secondary | ICD-10-CM | POA: Diagnosis not present

## 2021-12-01 DIAGNOSIS — M25551 Pain in right hip: Secondary | ICD-10-CM | POA: Diagnosis not present

## 2021-12-01 DIAGNOSIS — Z9889 Other specified postprocedural states: Secondary | ICD-10-CM | POA: Diagnosis not present

## 2021-12-01 DIAGNOSIS — M7061 Trochanteric bursitis, right hip: Secondary | ICD-10-CM | POA: Diagnosis not present

## 2022-02-19 ENCOUNTER — Emergency Department (HOSPITAL_COMMUNITY)
Admission: EM | Admit: 2022-02-19 | Discharge: 2022-02-19 | Disposition: A | Discharge status: Home / Self Care | Payer: Medicare HMO | Attending: Emergency Medicine | Admitting: Emergency Medicine

## 2022-02-19 ENCOUNTER — Encounter (HOSPITAL_COMMUNITY): Payer: Self-pay | Admitting: Emergency Medicine

## 2022-02-19 ENCOUNTER — Other Ambulatory Visit: Payer: Self-pay

## 2022-02-19 DIAGNOSIS — W274XXA Contact with kitchen utensil, initial encounter: Secondary | ICD-10-CM | POA: Insufficient documentation

## 2022-02-19 DIAGNOSIS — S68622A Partial traumatic transphalangeal amputation of right middle finger, initial encounter: Secondary | ICD-10-CM | POA: Diagnosis not present

## 2022-02-19 DIAGNOSIS — Z79899 Other long term (current) drug therapy: Secondary | ICD-10-CM | POA: Insufficient documentation

## 2022-02-19 DIAGNOSIS — I10 Essential (primary) hypertension: Secondary | ICD-10-CM | POA: Diagnosis not present

## 2022-02-19 DIAGNOSIS — S6991XA Unspecified injury of right wrist, hand and finger(s), initial encounter: Secondary | ICD-10-CM | POA: Diagnosis present

## 2022-02-19 DIAGNOSIS — S61212A Laceration without foreign body of right middle finger without damage to nail, initial encounter: Secondary | ICD-10-CM | POA: Diagnosis not present

## 2022-02-19 DIAGNOSIS — Y93G3 Activity, cooking and baking: Secondary | ICD-10-CM | POA: Diagnosis not present

## 2022-02-19 DIAGNOSIS — S68119A Complete traumatic metacarpophalangeal amputation of unspecified finger, initial encounter: Secondary | ICD-10-CM

## 2022-02-19 DIAGNOSIS — S68112A Complete traumatic metacarpophalangeal amputation of right middle finger, initial encounter: Secondary | ICD-10-CM | POA: Diagnosis not present

## 2022-02-19 MED ORDER — LIDOCAINE HCL (PF) 1 % IJ SOLN
5.0000 mL | Freq: Once | INTRAMUSCULAR | Status: DC
  Filled 2022-02-19: qty 5

## 2022-02-19 MED ORDER — CEPHALEXIN 500 MG PO CAPS: 500.0000 mg | ORAL_CAPSULE | Freq: Four times a day (QID) | ORAL | 0 refills | Status: DC

## 2022-02-19 NOTE — ED Provider Notes (Signed)
[facility] EMERGENCY DEPARTMENT Provider Note   CSN: [id number] Arrival date & time: 02/19/22  1900     History {Add pertinent medical, surgical, social history, OB history to HPI:1} Chief Complaint  Patient presents with   Finger Injury    Alexandra [name] is a 76 y.o. female.  HPI Patient with laceration to tip of right middle finger.  Avulsion of the tip on a mandolin.  Has piece with her.  Last tetanus is greater than 10 years ago but states she does not want a tetanus shot.  No other injury.   Past Medical History:  Diagnosis Date   Depression    Gout    High cholesterol    Hypertension     Home Medications Prior to Admission medications   Medication Sig Start Date End Date Taking? Authorizing Provider  albuterol (VENTOLIN HFA) 108 (90 Base) MCG/ACT inhaler Inhale 1-2 puffs into the lungs every 6 (six) hours as needed for wheezing or shortness of breath. 04/05/21   Jaynee Eagles, PA-C  atorvastatin (LIPITOR) 20 MG tablet Take 20 mg by mouth daily.    [provider]  Coenzyme Q10 (COQ10) 100 MG CAPS Take by mouth every other day.     [provider]  cyclobenzaprine (FLEXERIL) 10 MG tablet Take 1 tablet (10 mg total) by mouth 2 (two) times daily as needed for muscle spasms. 12/13/19   Avegno, Darrelyn Hillock, FNP  Ferrous Sulfate (FEROSUL PO) Take by mouth daily.     [provider]  folic acid (FOLVITE) 1 MG tablet Take by mouth daily.    [provider]  lamoTRIgine (LAMICTAL) 150 MG tablet Take 150 mg by mouth daily.    [provider]  losartan (COZAAR) 50 MG tablet Take 50 mg by mouth daily.    [provider]  metFORMIN (GLUCOPHAGE) 500 MG tablet Take by mouth daily.    [provider]  methotrexate (50 MG/ML) 1 g injection Inject 20 mg into the vein once a week. SQ    [provider]  metoprolol succinate (TOPROL-XL) 50 MG 24 hr tablet Take 50 mg by mouth daily. Take with or immediately following a  meal.    [provider]  omeprazole (PRILOSEC) 40 MG capsule Take 40 mg by mouth at bedtime.    [provider]      Allergies    Codeine and Naproxen    Review of Systems   Review of Systems  Physical Exam Updated Vital Signs BP 138/79 (BP Location: Left Arm)   Pulse 80   Temp 98.8 F (37.1 C) (Oral)   Resp 16   Ht '5\' 1"'$  (1.549 m)   Wt 85.7 kg   SpO2 98%   BMI 35.71 kg/m  Physical Exam Vitals reviewed.  Musculoskeletal:     Comments: Avulsion of tip of right middle finger.  Approximately 1 cm across and goes almost up to the nail but does not appear to involve the nail.  No bone palpated.  Good flexion and extension at the finger.  Neurological:     Mental Status: She is alert.     ED Results / Procedures / Treatments   Labs (all labs ordered are listed, but only abnormal results are displayed) Labs Reviewed - No data to display  EKG None  Radiology No results found.  Procedures Procedures  {Document cardiac monitor, telemetry assessment procedure when appropriate:1}  Medications Ordered in ED Medications  lidocaine (PF) (XYLOCAINE) 1 % injection 5  mL (has no administration in time range)    ED Course/ Medical Decision Making/ A&P                           Medical Decision Making Risk Prescription drug management.   ***  {Document critical care time when appropriate:1} {Document review of labs and clinical decision tools ie heart score, Chads2Vasc2 etc:1}  {Document your independent review of radiology images, and any outside records:1} {Document your discussion with family members, caretakers, and with consultants:1} {Document social determinants of health affecting pt's care:1} {Document your decision making why or why not admission, treatments were needed:1} Final Clinical Impression(s) / ED Diagnoses Final diagnoses:  None    Rx / DC Orders ED Discharge Orders     None

## 2022-02-19 NOTE — ED Triage Notes (Signed)
Pt slicing sweet potatoes on a mandolin slicer and pt sliced the tip of middle right finger off. Area is bleeding and tender. Wrapped with non-stick guaze.

## 2022-02-20 ENCOUNTER — Other Ambulatory Visit: Payer: Self-pay

## 2022-02-20 ENCOUNTER — Emergency Department (HOSPITAL_COMMUNITY): Payer: Medicare HMO

## 2022-02-20 ENCOUNTER — Emergency Department (HOSPITAL_COMMUNITY)
Admission: EM | Admit: 2022-02-20 | Discharge: 2022-02-20 | Disposition: A | Discharge status: Home / Self Care | Payer: Medicare HMO | Attending: Emergency Medicine | Admitting: Emergency Medicine

## 2022-02-20 ENCOUNTER — Encounter (HOSPITAL_COMMUNITY): Payer: Self-pay | Admitting: Emergency Medicine

## 2022-02-20 ENCOUNTER — Encounter (HOSPITAL_COMMUNITY): Payer: Self-pay

## 2022-02-20 DIAGNOSIS — Z7901 Long term (current) use of anticoagulants: Secondary | ICD-10-CM | POA: Diagnosis not present

## 2022-02-20 DIAGNOSIS — I10 Essential (primary) hypertension: Secondary | ICD-10-CM | POA: Diagnosis not present

## 2022-02-20 DIAGNOSIS — R0602 Shortness of breath: Secondary | ICD-10-CM | POA: Diagnosis not present

## 2022-02-20 DIAGNOSIS — I480 Paroxysmal atrial fibrillation: Secondary | ICD-10-CM

## 2022-02-20 DIAGNOSIS — Z79899 Other long term (current) drug therapy: Secondary | ICD-10-CM | POA: Insufficient documentation

## 2022-02-20 DIAGNOSIS — R0789 Other chest pain: Secondary | ICD-10-CM | POA: Diagnosis present

## 2022-02-20 LAB — COMPREHENSIVE METABOLIC PANEL
ALT: 39 U/L (ref 0–44)
AST: 36 U/L (ref 15–41)
Albumin: 4.2 g/dL (ref 3.5–5.0)
Alkaline Phosphatase: 66 U/L (ref 38–126)
Anion gap: 11 (ref 5–15)
BUN: 30 mg/dL — ABNORMAL HIGH (ref 8–23)
CO2: 25 mmol/L (ref 22–32)
Calcium: 9.7 mg/dL (ref 8.9–10.3)
Chloride: 103 mmol/L (ref 98–111)
Creatinine, Ser: 1.19 mg/dL — ABNORMAL HIGH (ref 0.44–1.00)
GFR, Estimated: 47 mL/min — ABNORMAL LOW (ref >60)
Glucose, Bld: 104 mg/dL — ABNORMAL HIGH (ref 70–99)
Potassium: 3.7 mmol/L (ref 3.5–5.1)
Sodium: 139 mmol/L (ref 135–145)
Total Bilirubin: 0.8 mg/dL (ref 0.3–1.2)
Total Protein: 7.6 g/dL (ref 6.5–8.1)

## 2022-02-20 LAB — CBC WITH DIFFERENTIAL/PLATELET
Abs Immature Granulocytes: 0.02 10*3/uL (ref 0.00–0.07)
Basophils Absolute: 0.1 10*3/uL (ref 0.0–0.1)
Basophils Relative: 1 %
Eosinophils Absolute: 0.3 10*3/uL (ref 0.0–0.5)
Eosinophils Relative: 3 %
HCT: 34.5 % — ABNORMAL LOW (ref 36.0–46.0)
Hemoglobin: 12 g/dL (ref 12.0–15.0)
Immature Granulocytes: 0 %
Lymphocytes Relative: 36 %
Lymphs Abs: 3 10*3/uL (ref 0.7–4.0)
MCH: 31.7 pg (ref 26.0–34.0)
MCHC: 34.8 g/dL (ref 30.0–36.0)
MCV: 91.3 fL (ref 80.0–100.0)
Monocytes Absolute: 0.7 10*3/uL (ref 0.1–1.0)
Monocytes Relative: 8 %
Neutro Abs: 4.4 10*3/uL (ref 1.7–7.7)
Neutrophils Relative %: 52 %
Platelets: 317 10*3/uL (ref 150–400)
RBC: 3.78 MIL/uL — ABNORMAL LOW (ref 3.87–5.11)
RDW: 14.5 % (ref 11.5–15.5)
WBC: 8.4 10*3/uL (ref 4.0–10.5)
nRBC: 0 % (ref 0.0–0.2)

## 2022-02-20 LAB — TROPONIN I (HIGH SENSITIVITY)
Troponin I (High Sensitivity): 13 ng/L (ref <18)
Troponin I (High Sensitivity): 18 ng/L — ABNORMAL HIGH (ref <18)

## 2022-02-20 MED ORDER — APIXABAN 5 MG PO TABS
5.0000 mg | ORAL_TABLET | Freq: Once | ORAL | Status: AC
  Administered 2022-02-20: 5 mg via ORAL
  Filled 2022-02-20: qty 1

## 2022-02-20 MED ORDER — DILTIAZEM HCL-DEXTROSE 125-5 MG/125ML-% IV SOLN (PREMIX)
5.0000 mg/h | INTRAVENOUS | Status: DC
  Administered 2022-02-20: 5 mg/h via INTRAVENOUS
  Filled 2022-02-20: qty 125

## 2022-02-20 MED ORDER — APIXABAN 5 MG PO TABS: 5.0000 mg | ORAL_TABLET | Freq: Two times a day (BID) | ORAL | 0 refills | Status: DC

## 2022-02-20 MED ORDER — DILTIAZEM HCL 25 MG/5ML IV SOLN
10.0000 mg | Freq: Once | INTRAVENOUS | Status: AC
  Administered 2022-02-20: 10 mg via INTRAVENOUS
  Filled 2022-02-20: qty 5

## 2022-02-20 NOTE — ED Provider Notes (Signed)
[facility] EMERGENCY DEPARTMENT Provider Note   CSN: [id number] Arrival date & time: 02/20/22  1645     History {Add pertinent medical, surgical, social history, OB history to HPI:1} Chief Complaint  Patient presents with   Chest Pain    Alexandra [name] is a 76 y.o. female.  Patient complains of chest tightness.  Patient has a history of hypertension   Chest Pain      Home Medications Prior to Admission medications   Medication Sig Start Date End Date Taking? Authorizing Provider  apixaban (ELIQUIS) 5 MG TABS tablet Take 1 tablet (5 mg total) by mouth 2 (two) times daily. 02/20/22  Yes Milton Ferguson, MD  albuterol (VENTOLIN HFA) 108 (90 Base) MCG/ACT inhaler Inhale 1-2 puffs into the lungs every 6 (six) hours as needed for wheezing or shortness of breath. 04/05/21   Jaynee Eagles, PA-C  atorvastatin (LIPITOR) 20 MG tablet Take 20 mg by mouth daily.    [provider]  cephALEXin (KEFLEX) 500 MG capsule Take 1 capsule (500 mg total) by mouth 4 (four) times daily. 02/19/22   Davonna Belling, MD  Coenzyme Q10 (COQ10) 100 MG CAPS Take by mouth every other day.     [provider]  cyclobenzaprine (FLEXERIL) 10 MG tablet Take 1 tablet (10 mg total) by mouth 2 (two) times daily as needed for muscle spasms. 12/13/19   Avegno, Darrelyn Hillock, FNP  Ferrous Sulfate (FEROSUL PO) Take by mouth daily.     [provider]  folic acid (FOLVITE) 1 MG tablet Take by mouth daily.    [provider]  lamoTRIgine (LAMICTAL) 150 MG tablet Take 150 mg by mouth daily.    [provider]  losartan (COZAAR) 50 MG tablet Take 50 mg by mouth daily.    [provider]  metFORMIN (GLUCOPHAGE) 500 MG tablet Take by mouth daily.    [provider]  methotrexate (50 MG/ML) 1 g injection Inject 20 mg into the vein once a week. SQ    [provider]  metoprolol succinate (TOPROL-XL) 50 MG 24 hr tablet Take 50 mg by mouth daily. Take with or  immediately following a meal.    [provider]  omeprazole (PRILOSEC) 40 MG capsule Take 40 mg by mouth at bedtime.    [provider]      Allergies    Codeine and Naproxen    Review of Systems   Review of Systems  Cardiovascular:  Positive for chest pain.    Physical Exam Updated Vital Signs BP (!) 145/67   Pulse 89   Temp 97.9 F (36.6 C)   Resp 13   Ht '5\' 1"'$  (1.549 m)   Wt 85.7 kg   SpO2 97%   BMI 35.71 kg/m  Physical Exam  ED Results / Procedures / Treatments   Labs (all labs ordered are listed, but only abnormal results are displayed) Labs Reviewed  CBC WITH DIFFERENTIAL/PLATELET - Abnormal; Notable for the following components:      Result Value   RBC 3.78 (*)    HCT 34.5 (*)    All other components within normal limits  COMPREHENSIVE METABOLIC PANEL - Abnormal; Notable for the following components:   Glucose, Bld 104 (*)    BUN 30 (*)    Creatinine, Ser 1.19 (*)    GFR, Estimated 47 (*)    All other components within normal limits  TROPONIN I (HIGH SENSITIVITY) - Abnormal; Notable for the following components:   Troponin  I (High Sensitivity) 18 (*)    All other components within normal limits  TROPONIN I (HIGH SENSITIVITY)    EKG None  Radiology DG Chest Port 1 View  Result Date: 02/20/2022 CLINICAL DATA:  Shortness of breath EXAM: PORTABLE CHEST 1 VIEW COMPARISON:  April 05, 2021 FINDINGS: The heart, hila, and mediastinum are normal. No pneumothorax. No nodules or masses. No focal infiltrates. IMPRESSION: No active disease. Electronically Signed   By: Dorise Bullion III M.D.   On: 02/20/2022 17:35    Procedures Procedures  {Document cardiac monitor, telemetry assessment procedure when appropriate:1}  Medications Ordered in ED Medications  diltiazem (CARDIZEM) 125 mg in dextrose 5% 125 mL (1 mg/mL) infusion (0 mg/hr Intravenous Stopped 02/20/22 1851)  apixaban (ELIQUIS) tablet 5 mg (has no administration in time range)   diltiazem (CARDIZEM) injection 10 mg (10 mg Intravenous Given 02/20/22 1724)    ED Course/ Medical Decision Making/ A&P  CRITICAL CARE Performed by: Milton Ferguson Total critical care time: 45 minutes Critical care time was exclusive of separately billable procedures and treating other patients. Critical care was necessary to treat or prevent imminent or life-threatening deterioration. Critical care was time spent personally by me on the following activities: development of treatment plan with patient and/or surrogate as well as nursing, discussions with consultants, evaluation of patient's response to treatment, examination of patient, obtaining history from patient or surrogate, ordering and performing treatments and interventions, ordering and review of laboratory studies, ordering and review of radiographic studies, pulse oximetry and re-evaluation of patient's condition.    Patient was in rapid atrial fibs.  She was given 10 of Cardizem IV and started on a drip.  She converted to normal sinus after 5 minutes.  Her symptoms completely resolved.  I spoke with cardiology Dr. Golden Hurter and we will start the patient on Eliquis 5 mg twice a day and they will follow-up with cardiology                         Medical Decision Making Amount and/or Complexity of Data Reviewed Labs: ordered. Radiology: ordered.  Risk Prescription drug management.   Paroxysmal atrial fibs  {Document critical care time when appropriate:1} {Document review of labs and clinical decision tools ie heart score, Chads2Vasc2 etc:1}  {Document your independent review of radiology images, and any outside records:1} {Document your discussion with family members, caretakers, and with consultants:1} {Document social determinants of health affecting pt's care:1} {Document your decision making why or why not admission, treatments were needed:1} Final Clinical Impression(s) / ED Diagnoses Final diagnoses:  Paroxysmal  atrial fibrillation ([place])    Rx / DC Orders ED Discharge Orders          Ordered    apixaban (ELIQUIS) 5 MG TABS tablet  2 times daily        02/20/22 2030

## 2022-02-20 NOTE — Discharge Instructions (Addendum)
Follow-up with Dr. Harl Bowie or one of his colleagues in the next couple weeks.  Return if problems

## 2022-02-20 NOTE — ED Triage Notes (Signed)
Pt c/o cp/generalized weakness and sweating today.

## 2022-02-22 ENCOUNTER — Telehealth: Payer: Self-pay

## 2022-02-22 DIAGNOSIS — M48061 Spinal stenosis, lumbar region without neurogenic claudication: Secondary | ICD-10-CM | POA: Diagnosis not present

## 2022-02-22 DIAGNOSIS — E669 Obesity, unspecified: Secondary | ICD-10-CM | POA: Diagnosis not present

## 2022-02-22 DIAGNOSIS — Z79899 Other long term (current) drug therapy: Secondary | ICD-10-CM | POA: Diagnosis not present

## 2022-02-22 DIAGNOSIS — M0579 Rheumatoid arthritis with rheumatoid factor of multiple sites without organ or systems involvement: Secondary | ICD-10-CM | POA: Diagnosis not present

## 2022-02-22 DIAGNOSIS — Z6835 Body mass index (BMI) 35.0-35.9, adult: Secondary | ICD-10-CM | POA: Diagnosis not present

## 2022-02-22 DIAGNOSIS — M1991 Primary osteoarthritis, unspecified site: Secondary | ICD-10-CM | POA: Diagnosis not present

## 2022-02-22 NOTE — Telephone Encounter (Signed)
Patient is here to make an appointment to see you. It is a ER fol/up, but in the note I see where it was put to follow up with hand surgery.  Please advise

## 2022-02-26 ENCOUNTER — Ambulatory Visit: Payer: Medicare HMO | Admitting: Orthopedic Surgery

## 2022-02-26 ENCOUNTER — Ambulatory Visit (INDEPENDENT_AMBULATORY_CARE_PROVIDER_SITE_OTHER): Payer: Medicare HMO

## 2022-02-26 ENCOUNTER — Encounter: Payer: Self-pay | Admitting: Orthopedic Surgery

## 2022-02-26 VITALS — BP 117/66 | HR 69 | Ht 61.0 in | Wt 194.0 lb

## 2022-02-26 DIAGNOSIS — S61212A Laceration without foreign body of right middle finger without damage to nail, initial encounter: Secondary | ICD-10-CM | POA: Diagnosis not present

## 2022-02-26 DIAGNOSIS — S6991XA Unspecified injury of right wrist, hand and finger(s), initial encounter: Secondary | ICD-10-CM

## 2022-02-26 NOTE — Patient Instructions (Signed)
Use the splint provided to protect the tip of your finger.  Change dressings regularly  Keep the injury clean.  Okay to let soap and water run over your finger.  Do not scrub the injury.  Pat the injury dry.  Do not submerge the finger until it is fully healed.  Complete antibiotics as prescribed  Follow-up in approximately 10 days

## 2022-02-26 NOTE — Progress Notes (Signed)
New Patient Visit  Assessment: Alexandra [name] is a 76 y.o. female with the following: 1. Injury of right middle finger, initial encounter 2. Laceration of right middle finger without foreign body without damage to nail, initial encounter   Plan: Trinia C Kirchner sustained a laceration to the tip of the right long finger a few days ago.  Radiographs are without bony involvement.  The overlying skin may not survive, but it is providing a good dressing at this point.  She was placed in a new dressing, and a stack splint to provide protection.  Complete the Keflex antibiotics provided by the emergency department.  I will see her back in approximately 10 days.  If she has issues before then, she should contact the clinic.  Follow-up: Return in about 10 days (around 03/08/2022).  Subjective:  Chief Complaint  Patient presents with   New Patient (Initial Visit)   Hand Injury    RT long finger DOI 02/19/22 injured on a mandolin/tip of finger re-attached in ED    History of Present Illness: Alexandra [name] is a 76 y.o. female who presents for evaluation of a right long finger laceration.  She cut the tip of her finger on a mandolin a few days ago.  She was seen in the emergency department.  The skin was reattached.  She was told that that skin may not survive.  She has been taking Keflex without issues.  She states she has been in the tip of her finger a few times, which is painful.  It is also caused some bleeding.  She does have some soreness in that finger, but no fevers or chills.  No drainage.   Review of Systems: No fevers or chills No numbness or tingling No chest pain No shortness of breath No bowel or bladder dysfunction No GI distress No headaches   Medical History:  Past Medical History:  Diagnosis Date   Depression    Gout    High cholesterol    Hypertension     Past Surgical History:  Procedure Laterality Date   CARPAL TUNNEL RELEASE     CHOLECYSTECTOMY      REPLACEMENT TOTAL KNEE      No family history on file. Social History   Tobacco Use   Smoking status: Never   Smokeless tobacco: Never  Substance Use Topics   Alcohol use: Yes    Comment: rarely   Drug use: Never    Allergies  Allergen Reactions   Codeine Nausea And Vomiting and Nausea Only   Naproxen Itching    Current Meds  Medication Sig   albuterol (VENTOLIN HFA) 108 (90 Base) MCG/ACT inhaler Inhale 1-2 puffs into the lungs every 6 (six) hours as needed for wheezing or shortness of breath.   apixaban (ELIQUIS) 5 MG TABS tablet Take 1 tablet (5 mg total) by mouth 2 (two) times daily.   atorvastatin (LIPITOR) 20 MG tablet Take 20 mg by mouth daily.   cephALEXin (KEFLEX) 500 MG capsule Take 1 capsule (500 mg total) by mouth 4 (four) times daily.   Coenzyme Q10 (COQ10) 100 MG CAPS Take by mouth every other day.    cyclobenzaprine (FLEXERIL) 10 MG tablet Take 1 tablet (10 mg total) by mouth 2 (two) times daily as needed for muscle spasms.   Ferrous Sulfate (FEROSUL PO) Take by mouth daily.    folic acid (FOLVITE) 1 MG tablet Take by mouth daily.   lamoTRIgine (LAMICTAL) 150 MG tablet Take 150 mg by  mouth daily.   losartan (COZAAR) 50 MG tablet Take 50 mg by mouth daily.   metFORMIN (GLUCOPHAGE) 500 MG tablet Take by mouth daily.   methotrexate (50 MG/ML) 1 g injection Inject 20 mg into the vein once a week. SQ   metoprolol succinate (TOPROL-XL) 50 MG 24 hr tablet Take 50 mg by mouth daily. Take with or immediately following a meal.   omeprazole (PRILOSEC) 40 MG capsule Take 40 mg by mouth at bedtime.    Objective: BP 117/66   Pulse 69   Ht '5\' 1"'$  (1.549 m)   Wt 194 lb (88 kg)   BMI 36.66 kg/m   Physical Exam:  General: Alert and oriented. and No acute distress. Gait: Normal gait.  Laceration to the tip of the long finger on the right hand.  Skin was avulsed.  This was reattached.  It is blanched at this point, but still in good position.  No drainage.  There is some  surrounding redness.  Tip of the finger is tender to palpation.  No involvement of the fingernail.  Diffuse arthritis throughout the hand.  IMAGING: I personally ordered and reviewed the following images  X-rays of the right long finger were obtained in clinic today.  No acute injuries are noted.  There is arthritis at the DIP and the PIP.  Small soft tissue defect in the distal extent of the finger.  No bony involvement.  No dislocations.  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     Mordecai Rasmussen, MD  02/26/2022 8:49 AM

## 2022-03-01 NOTE — Progress Notes (Deleted)
  Cardiology Office Note:    Date:  03/01/2022   ID:  Harlen Labs, DOB Jul 20, 1945, MRN [id number]  PCP:  Redmond School, [place] Providers Cardiologist:  None { Click to update primary MD,subspecialty MD or APP then REFRESH:1}  *** Referring MD: Redmond School, MD   Chief Complaint:  No chief complaint on file. {Click here for Visit Info    :1}    History of Present Illness:   DELORSE SHANE is a 76 y.o. female with         Past Medical History:  Diagnosis Date   Depression    Gout    High cholesterol    Hypertension    Current Medications: No outpatient medications have been marked as taking for the 03/15/22 encounter (Appointment) with Imogene Burn, PA-C.    Allergies:   Codeine and Naproxen   Social History   Tobacco Use   Smoking status: Never   Smokeless tobacco: Never  Substance Use Topics   Alcohol use: Yes    Comment: rarely   Drug use: Never    Family Hx: The patient's family history is not on file.  ROS     Physical Exam:    VS:  There were no vitals taken for this visit.    Wt Readings from Last 3 Encounters:  02/26/22 194 lb (88 kg)  02/20/22 189 lb (85.7 kg)  02/19/22 189 lb (85.7 kg)    Physical Exam  GEN: Well nourished, well developed, in no acute distress  HEENT: normal  Neck: no JVD, carotid bruits, or masses Cardiac:RRR; no murmurs, rubs, or gallops  Respiratory:  clear to auscultation bilaterally, normal work of breathing GI: soft, nontender, nondistended, + BS Ext: without cyanosis, clubbing, or edema, Good distal pulses bilaterally MS: no deformity or atrophy  Skin: warm and dry, no rash Neuro:  Alert and Oriented x 3, Strength and sensation are intact Psych: euthymic mood, full affect        EKGs/Labs/Other Test Reviewed:    EKG:  EKG is *** ordered today.  The ekg ordered today demonstrates ***  Recent Labs: 02/20/2022: ALT 39; BUN 30; Creatinine, Ser 1.19; Hemoglobin 12.0; Platelets 317;  Potassium 3.7; Sodium 139   Recent Lipid Panel No results for input(s): "CHOL", "TRIG", "HDL", "VLDL", "LDLCALC", "LDLDIRECT" in the last 8760 hours.   Prior CV Studies: {Select studies to display:26339}  ***   Risk Assessment/Calculations/Metrics:   {Does this patient have ATRIAL FIBRILLATION?:[phone]}     No BP recorded.  {Refresh Note OR Click here to enter BP  :1}***    ASSESSMENT & PLAN:   No problem-specific Assessment & Plan notes found for this encounter.         {Are you ordering a CV Procedure (e.g. stress test, cath, DCCV, TEE, etc)?   Press F2        :[id number]}   Dispo:  No follow-ups on file.   Medication Adjustments/Labs and Tests Ordered: Current medicines are reviewed at length with the patient today.  Concerns regarding medicines are outlined above.  Tests Ordered: No orders of the defined types were placed in this encounter.  Medication Changes: No orders of the defined types were placed in this encounter.  Signed, Ermalinda Barrios, PA-C  03/01/2022 9:33 AM    [facility] [address], [place], [place]  [address] Phone: [phone]; Fax: [phone]

## 2022-03-08 ENCOUNTER — Ambulatory Visit: Payer: Medicare HMO | Admitting: Orthopedic Surgery

## 2022-03-08 ENCOUNTER — Encounter: Payer: Self-pay | Admitting: Orthopedic Surgery

## 2022-03-08 VITALS — Ht 61.0 in | Wt 189.0 lb

## 2022-03-08 DIAGNOSIS — N1831 Chronic kidney disease, stage 3a: Secondary | ICD-10-CM | POA: Diagnosis not present

## 2022-03-08 DIAGNOSIS — S61212D Laceration without foreign body of right middle finger without damage to nail, subsequent encounter: Secondary | ICD-10-CM

## 2022-03-08 DIAGNOSIS — Z23 Encounter for immunization: Secondary | ICD-10-CM | POA: Diagnosis not present

## 2022-03-08 DIAGNOSIS — Z1331 Encounter for screening for depression: Secondary | ICD-10-CM | POA: Diagnosis not present

## 2022-03-08 DIAGNOSIS — Z0001 Encounter for general adult medical examination with abnormal findings: Secondary | ICD-10-CM | POA: Diagnosis not present

## 2022-03-08 DIAGNOSIS — E1129 Type 2 diabetes mellitus with other diabetic kidney complication: Secondary | ICD-10-CM | POA: Diagnosis not present

## 2022-03-08 DIAGNOSIS — Z6835 Body mass index (BMI) 35.0-35.9, adult: Secondary | ICD-10-CM | POA: Diagnosis not present

## 2022-03-08 NOTE — Progress Notes (Signed)
Return patient Visit  Assessment: Alexandra [name] is a 76 y.o. female with the following: 1. Injury of right middle finger 2. Laceration of right middle finger without foreign body without damage to nail  Plan: Alexandra [name] sustained a laceration to the tip of the right long finger.  It is healing well.  Minimal pain.  The skin on the tip of her finger may eventually scab, but is protecting the underlying tissue as it heals.  Continue to use the splint to provide protection.  Follow-up in 2 weeks for repeat evaluation.  Call with issues.     Follow-up: Return in about 2 weeks (around 03/22/2022).  Subjective:  Chief Complaint  Patient presents with   Hand Injury    R middle finger tip/DOI 02/19/22.  Seems to be healing good. No pain unless I hit it accidentally.    History of Present Illness: Alexandra [name] is a 76 y.o. female who returns for evaluation of a right long finger laceration.  She has completed her antibiotics.  She has no pain to the distal tip of her long finger, unless she hits the finger inadvertently.  The brace provided for her has been very helpful.  No numbness or tingling.  She has not noticed any drainage.   Review of Systems: No fevers or chills No numbness or tingling No chest pain No shortness of breath No bowel or bladder dysfunction No GI distress No headaches      Objective: Ht '5\' 1"'$  (1.549 m)   Wt 189 lb (85.7 kg)   BMI 35.71 kg/m   Physical Exam:  General: Alert and oriented. and No acute distress. Gait: Normal gait.  Healing laceration to the distal aspect of the long finger.  No drainage.  No surrounding erythema.  Tenderness palpation of the distal tip of the finger.  Overlying skin does not appear viable, but is providing excellent protection, and is scabbing.  Good capillary refill.  IMAGING: I personally ordered and reviewed the following images  No new imaging obtained today.     New Medications:  No orders of the  defined types were placed in this encounter.     Mordecai Rasmussen, MD  03/08/2022 12:57 PM

## 2022-03-15 ENCOUNTER — Ambulatory Visit: Payer: Medicare HMO | Admitting: Physician Assistant

## 2022-03-16 ENCOUNTER — Encounter: Payer: Self-pay | Admitting: Internal Medicine

## 2022-03-16 ENCOUNTER — Encounter: Payer: Self-pay | Admitting: *Deleted

## 2022-03-16 ENCOUNTER — Ambulatory Visit: Payer: Medicare HMO | Attending: Internal Medicine | Admitting: Internal Medicine

## 2022-03-16 VITALS — BP 98/60 | HR 65 | Ht 61.0 in | Wt 191.0 lb

## 2022-03-16 DIAGNOSIS — I4892 Unspecified atrial flutter: Secondary | ICD-10-CM

## 2022-03-16 DIAGNOSIS — R079 Chest pain, unspecified: Secondary | ICD-10-CM | POA: Insufficient documentation

## 2022-03-16 DIAGNOSIS — I480 Paroxysmal atrial fibrillation: Secondary | ICD-10-CM | POA: Insufficient documentation

## 2022-03-16 DIAGNOSIS — I10 Essential (primary) hypertension: Secondary | ICD-10-CM | POA: Diagnosis not present

## 2022-03-16 MED ORDER — METOPROLOL TARTRATE 50 MG PO TABS: 50.0000 mg | ORAL_TABLET | Freq: Two times a day (BID) | ORAL | 1 refills | Status: DC

## 2022-03-16 MED ORDER — WARFARIN SODIUM 3 MG PO TABS: 3.0000 mg | ORAL_TABLET | Freq: Every day | ORAL | 1 refills | Status: DC

## 2022-03-16 MED ORDER — LOSARTAN POTASSIUM 25 MG PO TABS: 25.0000 mg | ORAL_TABLET | ORAL | 3 refills | Status: DC

## 2022-03-16 NOTE — Progress Notes (Addendum)
Cardiology Office Note  Date: 03/16/2022   ID: Alexandra [name], DOB 1946-02-08, MRN [id number]  PCP:  Redmond School, MD  Cardiologist:  Chalmers Guest, MD Electrophysiologist:  None   Reason for Office Visit: Evaluation of A-fib and chest pain at the request of Dr. Gerarda Fraction   History of Present Illness: Alexandra [name] is a 76 y.o. female known to have HTN, HLD, paroxysmal atrial flutter was referred to cardiology clinic for evaluation of atrial flutter and chest pain at the request of Dr. Gerarda Fraction.  Patient stated she has been feeling extremely weak and dizzy followed by onset of chest pressure and palpitations lasting for 10 to 15 minutes in the grocery store on 02/20/2022.  Patient rested after which palpitations resolved. However after reaching home, palpitations recurred again after which she called 911. EKG in the ER showed atrial flutter with RVR for which Cardizem IV was administered with return of heart rate to normal.  His RVR event could be secondary to her right middle finger pain from laceration that happened on 02/19/2022. Upon discharge from the ER, she had one-time recurrence of palpitations lasting for a few minutes.  She also noticed feeling fatigued and had one-time dizziness following discharge from the ER. Her blood pressure today in the clinic is low.  She does not check blood pressure at home.  Otherwise denied any angina outside of the palpitations episode on 02/20/2022.  No prior ischemic evaluation.  No family history premature ASCVD.  Denied alcohol use, smoking cigarettes and illicit drug abuse.  Patient stated that she has to be $42 per month for Eliquis and is requesting for any other alternatives.  Past Medical History:  Diagnosis Date   Depression    Gout    High cholesterol    Hypertension     Past Surgical History:  Procedure Laterality Date   CARPAL TUNNEL RELEASE     CHOLECYSTECTOMY     REPLACEMENT TOTAL KNEE      Current Outpatient  Medications  Medication Sig Dispense Refill   albuterol (VENTOLIN HFA) 108 (90 Base) MCG/ACT inhaler Inhale 1-2 puffs into the lungs every 6 (six) hours as needed for wheezing or shortness of breath. 18 g 0   ALPRAZolam (XANAX) 0.5 MG tablet Take 0.5 mg by mouth 3 (three) times daily as needed.     apixaban (ELIQUIS) 5 MG TABS tablet Take 1 tablet (5 mg total) by mouth 2 (two) times daily. 60 tablet 0   atorvastatin (LIPITOR) 20 MG tablet Take 20 mg by mouth daily.     escitalopram (LEXAPRO) 20 MG tablet Take 20 mg by mouth daily.     Ferrous Sulfate (FEROSUL PO) Take by mouth daily.      folic acid (FOLVITE) 1 MG tablet Take by mouth daily.     lamoTRIgine (LAMICTAL) 150 MG tablet Take 150 mg by mouth daily.     losartan (COZAAR) 50 MG tablet Take 50 mg by mouth daily.     methotrexate (50 MG/ML) 1 g injection Inject 20 mg into the vein once a week. SQ     metoprolol succinate (TOPROL-XL) 50 MG 24 hr tablet Take 50 mg by mouth daily. Take with or immediately following a meal.     No current facility-administered medications for this visit.   Allergies:  Codeine and Naproxen   Social History: The patient  reports that she has never smoked. She has never used smokeless tobacco. She reports current alcohol use. She reports that  she does not use drugs.   Family History: The patient's family history includes Cancer in her paternal grandfather and sister; Coronary artery disease in her brother; Heart attack in her mother; Pulmonary embolism in her father; Stroke in her maternal grandmother.   ROS:  Please see the history of present illness. Otherwise, complete review of systems is positive for none.  All other systems are reviewed and negative.   Physical Exam: VS:  Ht '5\' 1"'$  (1.549 m)   Wt 191 lb (86.6 kg)   BMI 36.09 kg/m , BMI Body mass index is 36.09 kg/m.  Wt Readings from Last 3 Encounters:  03/16/22 191 lb (86.6 kg)  03/08/22 189 lb (85.7 kg)  02/26/22 194 lb (88 kg)    General:  Patient appears comfortable at rest. HEENT: Conjunctiva and lids normal, oropharynx clear with moist mucosa. Neck: Supple, no elevated JVP or carotid bruits, no thyromegaly. Lungs: Clear to auscultation, nonlabored breathing at rest. Cardiac: Regular rate and rhythm, no S3 or significant systolic murmur, no pericardial rub. Abdomen: Soft, nontender, no hepatomegaly, bowel sounds present, no guarding or rebound. Extremities: No pitting edema, distal pulses 2+. Skin: Warm and dry. Musculoskeletal: No kyphosis. Neuropsychiatric: Alert and oriented x3, affect grossly appropriate.  ECG:  An ECG dated 03/16/22 was personally reviewed today and demonstrated:  NSR and no ST-T changes  Recent Labwork: 02/20/2022: ALT 39; AST 36; BUN 30; Creatinine, Ser 1.19; Hemoglobin 12.0; Platelets 317; Potassium 3.7; Sodium 139  No results found for: "CHOL", "TRIG", "HDL", "CHOLHDL", "VLDL", "LDLCALC", "LDLDIRECT"  Other Studies Reviewed Today:   Assessment and Plan: Patient is a 76 year old F known to have HTN, HLD, paroxysmal atrial flutter was referred to cardiology clinic for evaluation of atrial flutter and chest pain.  # Paroxysmal atrial flutter, CV score is 4 -Patient had atrial flutter with RVR episode in the setting of right middle finger laceration pain in 01/2022.  However she had 1 more episode of palpitations in 02/2022 unrelated to any pain. I will switch metoprolol succinate to metoprolol tartrate 50 mg twice a day. -Switch Eliquis to Coumadin 3 mg once daily due to higher co-pay with Eliquis ($42 per month) and insurance issues.  # HTN, over-controlled and symptomatic -Patient has been feeling weak and dizzy for 1 to 2 months now. Her blood pressure today is low, 98 sbp.  Hold losartan and check blood pressures every day in the morning.  If less than 140 SBP, can hold losartan. If more than 140, start losartan 25 mg once daily.  # Chest pain -Patient had chest pressure during palpitations  in October/2023.  Will obtain exercise Myoview to rule out CAD.  I have spent a total of 45 minutes with patient reviewing chart, EKGs, labs and examining patient as well as establishing an assessment and plan that was discussed with the patient.  > 50% of time was spent in direct patient care.      Medication Adjustments/Labs and Tests Ordered: Current medicines are reviewed at length with the patient today.  Concerns regarding medicines are outlined above.   Tests Ordered: Orders Placed This Encounter  Procedures   EKG 12-Lead    Medication Changes: No orders of the defined types were placed in this encounter.   Disposition:  Follow up  6 months  Signed Andreana Klingerman Fidel Levy, MD, 03/16/2022 10:50 AM    [facility] [place], [place], [place] [address]

## 2022-03-16 NOTE — Patient Instructions (Addendum)
Medication Instructions:  Your physician has recommended you make the following change in your medication:  Stop metoprolol succinate Start metoprolol tartrate 50 mg twice daily Stop eliquis  Start warfarin 3 mg daily with supper Change losartan to 25 mg daily as directed (hold losartan for 2 weeks, if your top blood pressure number is less than 140, stop losartan. If your top blood pressure number is greater than 140 mg, restart losartan at 25 mg daily Continue other medications the same  Labwork: none  Testing/Procedures: Your physician has requested that you have en exercise stress myoview. For further information please visit [email or web address]. Please follow instruction sheet, as given.  Follow-Up: Your physician recommends that you schedule a follow-up appointment in: 6 months You have been referred to Anticoagulation Clinic  Any Other Special Instructions Will Be Listed Below (If Applicable). Your physician has requested that you regularly monitor and record your blood pressure readings at home. Please use the same machine at the same time of day to check your readings and record them. Please contact our office if you have further dizziness  If you need a refill on your cardiac medications before your next appointment, please call your pharmacy.

## 2022-03-22 ENCOUNTER — Ambulatory Visit: Payer: Medicare HMO | Admitting: Orthopedic Surgery

## 2022-03-22 ENCOUNTER — Encounter: Payer: Self-pay | Admitting: Orthopedic Surgery

## 2022-03-22 DIAGNOSIS — S61212D Laceration without foreign body of right middle finger without damage to nail, subsequent encounter: Secondary | ICD-10-CM

## 2022-03-22 NOTE — Progress Notes (Signed)
Return patient Visit  Assessment: Alexandra [name] is a 76 y.o. female with the following: 1. Injury of right middle finger 2. Laceration of right middle finger without foreign body without damage to nail  Plan: [place] sustained a laceration to the tip of the right long finger.  Her finger is almost completely healed.  Small amount of scabbing.  No surrounding redness.  She does have some increased sensitivity to palpation, and notes some discomfort if she were to hit the finger.  Otherwise, she is doing very well.  Advised her to rub the finger to decrease sensitivity.  Otherwise, she will let me know if she has any issues.  Follow-up as needed.   Follow-up: No follow-ups on file.  Subjective:  Chief Complaint  Patient presents with   Hand Injury    Right middle finger doing good still some soreness    History of Present Illness: CHRISTINIA LAMBETH is a 76 y.o. female who returns for evaluation of a right long finger laceration.  She sustained a laceration to the tip of the right long finger.  This is healed well.  The skin provided appropriate protection, and became a scab.  The scab is fallen off.  Very small scab remains.  She does still have some sensitivity and tenderness to palpation.  No fevers or chills.  Review of Systems: No fevers or chills No numbness or tingling No chest pain No shortness of breath No bowel or bladder dysfunction No GI distress No headaches      Objective: There were no vitals taken for this visit.  Physical Exam:  General: Alert and oriented. and No acute distress. Gait: Normal gait.  Healing laceration to the distal aspect of the long finger.  No drainage.  No surrounding erythema.  Tenderness palpation of the distal tip of the finger.  Very small scab remains.  It is healthy appearing.  Good capillary refill.  IMAGING: I personally ordered and reviewed the following images  No new imaging obtained today.     New Medications:   No orders of the defined types were placed in this encounter.     Mordecai Rasmussen, MD  03/22/2022 11:25 AM

## 2022-03-23 ENCOUNTER — Telehealth: Payer: Self-pay | Admitting: Internal Medicine

## 2022-03-23 NOTE — Telephone Encounter (Signed)
New Message:     Patient said she is going to have to cancel her test at the hospital. She said she did not have the money that they want her to bring. She wants to know if she have it in an office, will she have to pay up front or less front?

## 2022-03-23 NOTE — Telephone Encounter (Signed)
Patient states that she was told by someone at the call center that she can have her stress test done in the [place] office. Informed the patient that this is done at [facility]. States that she is unable to pay $275 upfront and request that this appointment on 11/30 be cancelled. Appointment cancelled per patient request and Dr. Dellia Cloud notified.  Patient states that she would like for Lattie Haw, RN (coumadin) to call her regarding the switch from Eliquis to warfarin.

## 2022-03-23 NOTE — Telephone Encounter (Addendum)
Tried calling patient.  No answer.  Left message to call back.  Called and spoke with patient.  She has 2 tablets of Eliquis left.  Told her to go ahead and start warfarin '3mg'$  daily tonight along with Eliquis.  She will finish Eliquis in the morning and continue just warfarin.  INR appt made for 03/29/22 at 10am.  She verbalized understanding.

## 2022-03-23 NOTE — Telephone Encounter (Signed)
New Message:     Patient would like for you to give her a call please.

## 2022-03-24 MED ORDER — NITROGLYCERIN 0.4 MG SL SUBL: 0.4000 mg | SUBLINGUAL_TABLET | SUBLINGUAL | 1 refills | Status: AC | PRN

## 2022-03-24 NOTE — Telephone Encounter (Signed)
Patient made aware, verbalized understanding. Request that prescription for nitroglycerin be sent to [facility].

## 2022-03-25 ENCOUNTER — Encounter (HOSPITAL_COMMUNITY): Payer: Medicare HMO

## 2022-03-29 ENCOUNTER — Ambulatory Visit: Payer: Medicare HMO | Attending: Cardiology | Admitting: *Deleted

## 2022-03-29 DIAGNOSIS — Z5181 Encounter for therapeutic drug level monitoring: Secondary | ICD-10-CM | POA: Diagnosis not present

## 2022-03-29 DIAGNOSIS — Z6836 Body mass index (BMI) 36.0-36.9, adult: Secondary | ICD-10-CM | POA: Diagnosis not present

## 2022-03-29 DIAGNOSIS — B001 Herpesviral vesicular dermatitis: Secondary | ICD-10-CM | POA: Diagnosis not present

## 2022-03-29 DIAGNOSIS — R079 Chest pain, unspecified: Secondary | ICD-10-CM

## 2022-03-29 DIAGNOSIS — I480 Paroxysmal atrial fibrillation: Secondary | ICD-10-CM

## 2022-03-29 DIAGNOSIS — E6609 Other obesity due to excess calories: Secondary | ICD-10-CM | POA: Diagnosis not present

## 2022-03-29 LAB — POCT INR: INR: 1 — AB (ref 2.0–3.0)

## 2022-03-29 NOTE — Patient Instructions (Signed)
Pt has been taking warfarin '3mg'$  daily since 03/23/22 Increase warfarin to 1 1/2 tablets (4.'5mg'$ ) daily. Recheck in 1 wk

## 2022-04-05 ENCOUNTER — Ambulatory Visit: Payer: Medicare HMO | Attending: Internal Medicine | Admitting: *Deleted

## 2022-04-05 ENCOUNTER — Telehealth: Payer: Self-pay | Admitting: Internal Medicine

## 2022-04-05 DIAGNOSIS — Z5181 Encounter for therapeutic drug level monitoring: Secondary | ICD-10-CM

## 2022-04-05 DIAGNOSIS — I480 Paroxysmal atrial fibrillation: Secondary | ICD-10-CM | POA: Diagnosis not present

## 2022-04-05 DIAGNOSIS — B001 Herpesviral vesicular dermatitis: Secondary | ICD-10-CM | POA: Diagnosis not present

## 2022-04-05 DIAGNOSIS — K130 Diseases of lips: Secondary | ICD-10-CM | POA: Diagnosis not present

## 2022-04-05 DIAGNOSIS — Z6836 Body mass index (BMI) 36.0-36.9, adult: Secondary | ICD-10-CM | POA: Diagnosis not present

## 2022-04-05 DIAGNOSIS — E1129 Type 2 diabetes mellitus with other diabetic kidney complication: Secondary | ICD-10-CM | POA: Diagnosis not present

## 2022-04-05 DIAGNOSIS — N1831 Chronic kidney disease, stage 3a: Secondary | ICD-10-CM | POA: Diagnosis not present

## 2022-04-05 LAB — POCT INR: INR: 1.2 — AB (ref 2.0–3.0)

## 2022-04-05 NOTE — Telephone Encounter (Signed)
Patient requested to have her BP monitor (from home) checked and compared to the office monitor.  Please call and let her know if can have a time to get this checked.

## 2022-04-05 NOTE — Patient Instructions (Signed)
Increase warfarin to 2 tablets ('6mg'$ ) daily. Recheck in 1 wk

## 2022-04-12 ENCOUNTER — Ambulatory Visit: Payer: Medicare HMO | Admitting: *Deleted

## 2022-04-12 ENCOUNTER — Ambulatory Visit: Payer: Medicare HMO | Attending: Internal Medicine | Admitting: *Deleted

## 2022-04-12 VITALS — BP 153/82 | HR 52

## 2022-04-12 DIAGNOSIS — I10 Essential (primary) hypertension: Secondary | ICD-10-CM

## 2022-04-12 DIAGNOSIS — I480 Paroxysmal atrial fibrillation: Secondary | ICD-10-CM | POA: Diagnosis not present

## 2022-04-12 DIAGNOSIS — K130 Diseases of lips: Secondary | ICD-10-CM | POA: Diagnosis not present

## 2022-04-12 DIAGNOSIS — E6609 Other obesity due to excess calories: Secondary | ICD-10-CM | POA: Diagnosis not present

## 2022-04-12 DIAGNOSIS — Z5181 Encounter for therapeutic drug level monitoring: Secondary | ICD-10-CM

## 2022-04-12 DIAGNOSIS — B001 Herpesviral vesicular dermatitis: Secondary | ICD-10-CM | POA: Diagnosis not present

## 2022-04-12 DIAGNOSIS — Z6836 Body mass index (BMI) 36.0-36.9, adult: Secondary | ICD-10-CM | POA: Diagnosis not present

## 2022-04-12 DIAGNOSIS — N1831 Chronic kidney disease, stage 3a: Secondary | ICD-10-CM | POA: Diagnosis not present

## 2022-04-12 DIAGNOSIS — E1129 Type 2 diabetes mellitus with other diabetic kidney complication: Secondary | ICD-10-CM | POA: Diagnosis not present

## 2022-04-12 LAB — POCT INR: INR: 1.4 — AB (ref 2.0–3.0)

## 2022-04-12 NOTE — Progress Notes (Unsigned)
Patient in office this morning for BP check - brought in her monitor for comparison.   Her monitor - 153/82  82 - left upper arm   Our manual cuff -  122/70  55  97%  Rechecked BP on wrist area and BP was more like what I got on manual reading - 132/80  Suggested she may want to continue to use wrist as her location of choice as she does anatomically have a larger upper arm.    Reviewed proper technique for checking BP as well.    Will send readings to provider for any further suggestions.

## 2022-04-12 NOTE — Patient Instructions (Signed)
Increase warfarin to 2 tablets ('6mg'$ ) daily except 3 tablet ('9mg'$ ) on Mondays and Thursdays Recheck in 1 wk

## 2022-04-15 NOTE — Progress Notes (Signed)
Patient notified and verbalized understanding. 

## 2022-04-21 ENCOUNTER — Ambulatory Visit: Payer: Medicare HMO | Attending: Internal Medicine | Admitting: *Deleted

## 2022-04-21 DIAGNOSIS — Z5181 Encounter for therapeutic drug level monitoring: Secondary | ICD-10-CM

## 2022-04-21 DIAGNOSIS — I480 Paroxysmal atrial fibrillation: Secondary | ICD-10-CM

## 2022-04-21 LAB — POCT INR: INR: 2.3 (ref 2.0–3.0)

## 2022-04-21 MED ORDER — WARFARIN SODIUM 6 MG PO TABS: ORAL_TABLET | ORAL | 1 refills | Status: DC

## 2022-04-21 NOTE — Patient Instructions (Signed)
TABLET CHANGED TO '6MG'$  Continue current doe. Take 1 tablet ('6mg'$ ) daily except take 1 1/2 tablets ('9mg'$ ) on Mondays and Thursdays Recheck in 1 wk

## 2022-04-23 DIAGNOSIS — J020 Streptococcal pharyngitis: Secondary | ICD-10-CM | POA: Diagnosis not present

## 2022-04-23 DIAGNOSIS — K130 Diseases of lips: Secondary | ICD-10-CM | POA: Diagnosis not present

## 2022-04-23 DIAGNOSIS — M069 Rheumatoid arthritis, unspecified: Secondary | ICD-10-CM | POA: Diagnosis not present

## 2022-04-23 DIAGNOSIS — E1129 Type 2 diabetes mellitus with other diabetic kidney complication: Secondary | ICD-10-CM | POA: Diagnosis not present

## 2022-04-23 DIAGNOSIS — E6609 Other obesity due to excess calories: Secondary | ICD-10-CM | POA: Diagnosis not present

## 2022-04-23 DIAGNOSIS — N1831 Chronic kidney disease, stage 3a: Secondary | ICD-10-CM | POA: Diagnosis not present

## 2022-04-23 DIAGNOSIS — Z6836 Body mass index (BMI) 36.0-36.9, adult: Secondary | ICD-10-CM | POA: Diagnosis not present

## 2022-04-27 ENCOUNTER — Telehealth: Payer: Self-pay | Admitting: Internal Medicine

## 2022-04-27 DIAGNOSIS — Z6836 Body mass index (BMI) 36.0-36.9, adult: Secondary | ICD-10-CM | POA: Diagnosis not present

## 2022-04-27 DIAGNOSIS — B001 Herpesviral vesicular dermatitis: Secondary | ICD-10-CM | POA: Diagnosis not present

## 2022-04-27 DIAGNOSIS — M069 Rheumatoid arthritis, unspecified: Secondary | ICD-10-CM | POA: Diagnosis not present

## 2022-04-27 DIAGNOSIS — E6609 Other obesity due to excess calories: Secondary | ICD-10-CM | POA: Diagnosis not present

## 2022-04-27 DIAGNOSIS — J020 Streptococcal pharyngitis: Secondary | ICD-10-CM | POA: Diagnosis not present

## 2022-04-27 DIAGNOSIS — K130 Diseases of lips: Secondary | ICD-10-CM | POA: Diagnosis not present

## 2022-04-27 NOTE — Telephone Encounter (Signed)
I will forward to L.Reid,RN

## 2022-04-27 NOTE — Telephone Encounter (Signed)
Spoke with patient.  Did not take warfarin Sunday night or Monday night.  Did not get refill from mail order.  She is expecting it to arrive today.  Told pt to take 2 tablets tonight, 1 1/2 tablets tomorrow night then resume 1 tablet daily except 1 1/2 tablets on Mondays and Thursdays. ('6mg'$  tablet).  She verbalized understanding.  Appt rescheduled for 05/03/22.

## 2022-04-27 NOTE — Telephone Encounter (Signed)
Patient stated she had medication changes and would like RN Lattie Haw to call her back.

## 2022-05-03 ENCOUNTER — Ambulatory Visit: Payer: Medicare HMO | Attending: Internal Medicine | Admitting: *Deleted

## 2022-05-03 DIAGNOSIS — I480 Paroxysmal atrial fibrillation: Secondary | ICD-10-CM | POA: Diagnosis not present

## 2022-05-03 DIAGNOSIS — Z5181 Encounter for therapeutic drug level monitoring: Secondary | ICD-10-CM | POA: Diagnosis not present

## 2022-05-03 LAB — POCT INR: INR: 1.5 — AB (ref 2.0–3.0)

## 2022-05-03 NOTE — Patient Instructions (Signed)
TABLET CHANGED TO '6MG'$  Take warfarin 2 tablets tonight then increase dose to 1 tablet ('6mg'$ ) daily except take 1 1/2 tablets ('9mg'$ ) on Mondays, Wednesdays and Fridays Recheck in 1 wk

## 2022-05-10 ENCOUNTER — Ambulatory Visit: Payer: Medicare HMO | Attending: Internal Medicine | Admitting: *Deleted

## 2022-05-10 DIAGNOSIS — I480 Paroxysmal atrial fibrillation: Secondary | ICD-10-CM

## 2022-05-10 DIAGNOSIS — Z5181 Encounter for therapeutic drug level monitoring: Secondary | ICD-10-CM | POA: Diagnosis not present

## 2022-05-10 LAB — POCT INR: INR: 1.7 — AB (ref 2.0–3.0)

## 2022-05-10 NOTE — Patient Instructions (Signed)
TABLET CHANGED TO '6MG'$  Increase warfarin 1 1/2 tablets daily except 1 tablet on Sundays and Thursdays Recheck in 1 wk

## 2022-05-20 ENCOUNTER — Ambulatory Visit: Payer: Medicare HMO | Attending: Internal Medicine | Admitting: *Deleted

## 2022-05-20 DIAGNOSIS — I480 Paroxysmal atrial fibrillation: Secondary | ICD-10-CM | POA: Diagnosis not present

## 2022-05-20 DIAGNOSIS — Z5181 Encounter for therapeutic drug level monitoring: Secondary | ICD-10-CM | POA: Diagnosis not present

## 2022-05-20 LAB — POCT INR: INR: 2.4 (ref 2.0–3.0)

## 2022-05-20 NOTE — Patient Instructions (Signed)
TABLET CHANGED TO '6MG'$  Continue warfarin 1 1/2 tablets daily except 1 tablet on Sundays and Thursdays Recheck in 2 wks

## 2022-06-03 ENCOUNTER — Ambulatory Visit: Payer: Medicare HMO | Attending: Internal Medicine | Admitting: *Deleted

## 2022-06-03 DIAGNOSIS — Z5181 Encounter for therapeutic drug level monitoring: Secondary | ICD-10-CM

## 2022-06-03 DIAGNOSIS — I480 Paroxysmal atrial fibrillation: Secondary | ICD-10-CM

## 2022-06-03 LAB — POCT INR: INR: 3 (ref 2.0–3.0)

## 2022-06-03 NOTE — Patient Instructions (Signed)
TABLET CHANGED TO '6MG'$  Continue warfarin 1 1/2 tablets daily except 1 tablet on Sundays and Thursdays Recheck in 3 wks Increase greens

## 2022-06-11 DIAGNOSIS — E6609 Other obesity due to excess calories: Secondary | ICD-10-CM | POA: Diagnosis not present

## 2022-06-11 DIAGNOSIS — E1129 Type 2 diabetes mellitus with other diabetic kidney complication: Secondary | ICD-10-CM | POA: Diagnosis not present

## 2022-06-11 DIAGNOSIS — N1831 Chronic kidney disease, stage 3a: Secondary | ICD-10-CM | POA: Diagnosis not present

## 2022-06-11 DIAGNOSIS — J329 Chronic sinusitis, unspecified: Secondary | ICD-10-CM | POA: Diagnosis not present

## 2022-06-11 DIAGNOSIS — M0579 Rheumatoid arthritis with rheumatoid factor of multiple sites without organ or systems involvement: Secondary | ICD-10-CM | POA: Diagnosis not present

## 2022-06-11 DIAGNOSIS — Z6836 Body mass index (BMI) 36.0-36.9, adult: Secondary | ICD-10-CM | POA: Diagnosis not present

## 2022-06-11 DIAGNOSIS — E1165 Type 2 diabetes mellitus with hyperglycemia: Secondary | ICD-10-CM | POA: Diagnosis not present

## 2022-06-11 DIAGNOSIS — K219 Gastro-esophageal reflux disease without esophagitis: Secondary | ICD-10-CM | POA: Diagnosis not present

## 2022-06-24 ENCOUNTER — Ambulatory Visit: Payer: Medicare HMO | Attending: Internal Medicine | Admitting: *Deleted

## 2022-06-24 DIAGNOSIS — Z5181 Encounter for therapeutic drug level monitoring: Secondary | ICD-10-CM

## 2022-06-24 DIAGNOSIS — I480 Paroxysmal atrial fibrillation: Secondary | ICD-10-CM | POA: Diagnosis not present

## 2022-06-24 LAB — POCT INR: POC INR: 2.7

## 2022-06-24 NOTE — Patient Instructions (Signed)
Description   TABLET CHANGED TO '6MG'$  Continue warfarin 1 1/2 tablets daily except 1 tablet on Sundays and Thursdays Recheck in 4 wks

## 2022-07-01 DIAGNOSIS — E119 Type 2 diabetes mellitus without complications: Secondary | ICD-10-CM | POA: Diagnosis not present

## 2022-07-01 DIAGNOSIS — B009 Herpesviral infection, unspecified: Secondary | ICD-10-CM | POA: Insufficient documentation

## 2022-07-01 DIAGNOSIS — K219 Gastro-esophageal reflux disease without esophagitis: Secondary | ICD-10-CM | POA: Insufficient documentation

## 2022-07-19 ENCOUNTER — Ambulatory Visit: Payer: Medicare HMO | Attending: Internal Medicine | Admitting: *Deleted

## 2022-07-19 DIAGNOSIS — Z5181 Encounter for therapeutic drug level monitoring: Secondary | ICD-10-CM

## 2022-07-19 DIAGNOSIS — I480 Paroxysmal atrial fibrillation: Secondary | ICD-10-CM

## 2022-07-19 LAB — POCT INR: INR: 3 (ref 2.0–3.0)

## 2022-07-19 NOTE — Patient Instructions (Signed)
TABLET CHANGED TO 6MG  Continue warfarin 1 1/2 tablets daily except 1 tablet on Sundays and Thursdays Recheck in 6 wks

## 2022-08-02 DIAGNOSIS — M48062 Spinal stenosis, lumbar region with neurogenic claudication: Secondary | ICD-10-CM | POA: Diagnosis not present

## 2022-08-09 ENCOUNTER — Other Ambulatory Visit: Payer: Self-pay | Admitting: Internal Medicine

## 2022-08-12 DIAGNOSIS — M7061 Trochanteric bursitis, right hip: Secondary | ICD-10-CM | POA: Diagnosis not present

## 2022-08-16 ENCOUNTER — Telehealth: Payer: Self-pay | Admitting: Internal Medicine

## 2022-08-16 NOTE — Telephone Encounter (Signed)
I WILL PLACE THE CLEARANCE REQUEST INTO THE CORRECT FORMAT FOR PRE OP.      Pre-operative Risk Assessment    Patient Name: Alexandra [name]  DOB: [birth year] MRN: [id number]      Request for Surgical Clearance    Procedure:   LUMBAR ESI  Date of Surgery:  Clearance TBD  (NEED CLEARANCE URGENTLY)                               Surgeon: DR. Claria Dice Surgeon's Group or Practice Name:  [facility] Phone number:  [phone] Fax number:  [phone]   Type of Clearance Requested:   - Medical  - Pharmacy:  Hold Warfarin (Coumadin) x 5 DAYS PRIOR TO INJECTION   Type of Anesthesia:  Not Indicated   Additional requests/questions:    Elpidio Anis   08/16/2022, 2:17 PM

## 2022-08-16 NOTE — Telephone Encounter (Signed)
Patient with diagnosis of aflutter on warfarin for anticoagulation.    Procedure: lumbar ESI Date of procedure: TBD - urgent  CHA2DS2-VASc Score = 4  This indicates a 4.8% annual risk of stroke. The patient's score is based upon: CHF History: 0 HTN History: 1 Diabetes History: 0 Stroke History: 0 Vascular Disease History: 0 Age Score: 2 Gender Score: 1   CrCl 12mL/min using adj body weight Platelet count 317K  Per office protocol, patient can hold warfarin for 5 days prior to procedure. Patient will not need bridging with Lovenox (enoxaparin) around procedure.  **This guidance is not considered finalized until pre-operative APP has relayed final recommendations.**

## 2022-08-17 ENCOUNTER — Telehealth: Payer: Self-pay | Admitting: *Deleted

## 2022-08-17 DIAGNOSIS — H5203 Hypermetropia, bilateral: Secondary | ICD-10-CM | POA: Diagnosis not present

## 2022-08-17 NOTE — Telephone Encounter (Signed)
Primary Cardiologist:Vishnu P Mallipeddi, MD   Preoperative team, please contact this patient and set up a phone call appointment for further preoperative risk assessment. Please obtain consent and complete medication review. Thank you for your help.   I confirm that guidance regarding antiplatelet and oral anticoagulation therapy has been completed and, if necessary, noted below.   Levi Aland, NP-C  08/17/2022, 8:12 AM [address], [address] Office [phone] Fax [phone]

## 2022-08-17 NOTE — Telephone Encounter (Signed)
I have s/w the pt and she has been scheduled for tele pre op appt 08/20/22 add on due to urgent request. Med rec and consent are done.

## 2022-08-17 NOTE — Telephone Encounter (Signed)
I have s/w the pt and she has been scheduled for tele pre op appt 08/20/22 add on due to urgent request. Med rec and consent are done.     Patient Consent for Virtual Visit        Alexandra [name] has provided verbal consent on 08/17/2022 for a virtual visit (video or telephone).   CONSENT FOR VIRTUAL VISIT FOR:  Alexandra [name]  By participating in this virtual visit I agree to the following:  I hereby voluntarily request, consent and authorize [facility] and its employed or contracted physicians, physician assistants, nurse practitioners or other licensed health care professionals (the Practitioner), to provide me with telemedicine health care services (the "Services") as deemed necessary by the treating Practitioner. I acknowledge and consent to receive the Services by the Practitioner via telemedicine. I understand that the telemedicine visit will involve communicating with the Practitioner through live audiovisual communication technology and the disclosure of certain medical information by electronic transmission. I acknowledge that I have been given the opportunity to request an in-person assessment or other available alternative prior to the telemedicine visit and am voluntarily participating in the telemedicine visit.  I understand that I have the right to withhold or withdraw my consent to the use of telemedicine in the course of my care at any time, without affecting my right to future care or treatment, and that the Practitioner or I may terminate the telemedicine visit at any time. I understand that I have the right to inspect all information obtained and/or recorded in the course of the telemedicine visit and may receive copies of available information for a reasonable fee.  I understand that some of the potential risks of receiving the Services via telemedicine include:  Delay or interruption in medical evaluation due to technological equipment failure or  disruption; Information transmitted may not be sufficient (e.g. poor resolution of images) to allow for appropriate medical decision making by the Practitioner; and/or  In rare instances, security protocols could fail, causing a breach of personal health information.  Furthermore, I acknowledge that it is my responsibility to provide information about my medical history, conditions and care that is complete and accurate to the best of my ability. I acknowledge that Practitioner's advice, recommendations, and/or decision may be based on factors not within their control, such as incomplete or inaccurate data provided by me or distortions of diagnostic images or specimens that may result from electronic transmissions. I understand that the practice of medicine is not an exact science and that Practitioner makes no warranties or guarantees regarding treatment outcomes. I acknowledge that a copy of this consent can be made available to me via my patient portal [facility] MyChart), or I can request a printed copy by calling the office of [facility].    I understand that my insurance will be billed for this visit.   I have read or had this consent read to me. I understand the contents of this consent, which adequately explains the benefits and risks of the Services being provided via telemedicine.  I have been provided ample opportunity to ask questions regarding this consent and the Services and have had my questions answered to my satisfaction. I give my informed consent for the services to be provided through the use of telemedicine in my medical care

## 2022-08-20 ENCOUNTER — Ambulatory Visit: Payer: Medicare HMO | Attending: Internal Medicine | Admitting: Nurse Practitioner

## 2022-08-20 DIAGNOSIS — Z01810 Encounter for preprocedural cardiovascular examination: Secondary | ICD-10-CM

## 2022-08-20 NOTE — Progress Notes (Signed)
Virtual Visit via Telephone Note   Because of Alexandra [name]'s co-morbid illnesses, she is at least at moderate risk for complications without adequate follow up.  This format is felt to be most appropriate for this patient at this time.  The patient did not have access to video technology/had technical difficulties with video requiring transitioning to audio format only (telephone).  All issues noted in this document were discussed and addressed.  No physical exam could be performed with this format.  Please refer to the patient's chart for her consent to telehealth for Alexandra [name].  Evaluation Performed:  Preoperative cardiovascular risk assessment _____________   Date:  08/20/2022   Patient ID:  Alexandra [name], DOB [birth year], MRN [id number] Patient Location:  Home Provider location:   Office  Primary Care Provider:  Elfredia Nevins, MD Primary Cardiologist:  Alexandra Bicker, MD  Chief Complaint / Patient Profile   77 y.o. y/o female with a h/o paroxysmal atrial flutter, chest pain, hypertension, and hyperlipidemia who is pending Lumbar ESI with Alexandra [name] of Alexandra [name] and presents today for telephonic preoperative cardiovascular risk assessment.  History of Present Illness    Alexandra [name] is a 77 y.o. female who presents via Web designer for a telehealth visit today.  Pt was last seen in cardiology clinic on 03/16/2022 by Dr. Jenene [name].  At that time Alexandra [name] was doing well.  The patient is now pending procedure as outlined above. Since her last visit, she has done well from a cardiac standpoint. She notes intermittent fleeting palpitations.   She denies chest pain, dyspnea, pnd, orthopnea, n, v, dizziness, syncope, edema, weight gain, or early satiety. All other systems reviewed and are otherwise negative except as noted above.   Past Medical History    Past Medical History:  Diagnosis Date   Depression    Gout    High  cholesterol    Hypertension    Past Surgical History:  Procedure Laterality Date   CARPAL TUNNEL RELEASE     CHOLECYSTECTOMY     REPLACEMENT TOTAL KNEE      Allergies  Allergies  Allergen Reactions   Codeine Nausea And Vomiting and Nausea Only   Naproxen Itching    Home Medications    Prior to Admission medications   Medication Sig Start Date End Date Taking? Authorizing Provider  albuterol (VENTOLIN HFA) 108 (90 Base) MCG/ACT inhaler Inhale 1-2 puffs into the lungs every 6 (six) hours as needed for wheezing or shortness of breath. 04/05/21   Alexandra Bamberg, PA-C  ALPRAZolam Prudy Feeler) 0.5 MG tablet Take 0.5 mg by mouth 3 (three) times daily as needed. 02/09/22   [provider]  atorvastatin (LIPITOR) 20 MG tablet Take 20 mg by mouth daily.    [provider]  escitalopram (LEXAPRO) 20 MG tablet Take 20 mg by mouth daily. 12/22/21   [provider]  Ferrous Sulfate (FEROSUL PO) Take by mouth daily.     [provider]  folic acid (FOLVITE) 1 MG tablet Take by mouth daily.    [provider]  lamoTRIgine (LAMICTAL) 150 MG tablet Take 150 mg by mouth daily.    [provider]  losartan (COZAAR) 25 MG tablet Take 1 tablet (25 mg total) by mouth as directed. hold losartan for 2 weeks, if your top blood pressure number is less than 140, stop losartan. If your top blood pressure number is greater than 140 mg, restart losartan at 25 mg daily  03/30/22   Mallipeddi, Vishnu P, MD  methotrexate (50 MG/ML) 1 g injection Inject 20 mg into the vein once a week. SQ    [provider]  metoprolol tartrate (LOPRESSOR) 50 MG tablet TAKE 1 TABLET (50 MG TOTAL) BY MOUTH 2 (TWO) TIMES DAILY. 08/09/22   Mallipeddi, Vishnu P, MD  nitroGLYCERIN (NITROSTAT) 0.4 MG SL tablet Place 1 tablet (0.4 mg total) under the tongue every 5 (five) minutes x 3 doses as needed for chest pain (If no relief after 3rd dose, call 911 or go to ED.). 03/24/22 06/22/22   Mallipeddi, Vishnu P, MD  warfarin (COUMADIN) 6 MG tablet Take 1 tablet to 1 1/2 tablets daily or as directed by coumadin clinic 04/21/22   Mallipeddi, Orion Modest, MD    Physical Exam    Vital Signs:  Alexandra [name] does not have vital signs available for review today.  Given telephonic nature of communication, physical exam is limited. AAOx3. NAD. Normal affect.  Speech and respirations are unlabored.  Accessory Clinical Findings    None  Assessment & Plan    1.  Preoperative Cardiovascular Risk Assessment:  According to the Revised Cardiac Risk Index (RCRI), her Perioperative Risk of Major Cardiac Event is (%): 0.4. Her Functional Capacity in METs is: 6.36 according to the Duke Activity Status Index (DASI). Therefore, based on ACC/AHA guidelines, patient would be at acceptable risk for the planned procedure without further cardiovascular testing.   The patient was advised that if she develops new symptoms prior to surgery to contact our office to arrange for a follow-up visit, and she verbalized understanding.  Per office protocol, patient can hold warfarin for 5 days prior to procedure. Patient will not need bridging with Lovenox (enoxaparin) around procedure. Please resume warfarin as soon as possible postprocedure, at the discretion of the surgeon.    A copy of this note will be routed to requesting surgeon.  Time:   Today, I have spent 5 minutes with the patient with telehealth technology discussing medical history, symptoms, and management plan.     Alexandra Grapes, NP  08/20/2022, 1:54 PM

## 2022-08-23 DIAGNOSIS — H524 Presbyopia: Secondary | ICD-10-CM | POA: Diagnosis not present

## 2022-08-23 DIAGNOSIS — H52223 Regular astigmatism, bilateral: Secondary | ICD-10-CM | POA: Diagnosis not present

## 2022-08-26 DIAGNOSIS — N1831 Chronic kidney disease, stage 3a: Secondary | ICD-10-CM | POA: Diagnosis not present

## 2022-08-26 DIAGNOSIS — Z6838 Body mass index (BMI) 38.0-38.9, adult: Secondary | ICD-10-CM | POA: Diagnosis not present

## 2022-08-26 DIAGNOSIS — I10 Essential (primary) hypertension: Secondary | ICD-10-CM | POA: Diagnosis not present

## 2022-08-30 ENCOUNTER — Ambulatory Visit: Payer: Medicare HMO | Attending: Internal Medicine | Admitting: *Deleted

## 2022-08-30 DIAGNOSIS — K760 Fatty (change of) liver, not elsewhere classified: Secondary | ICD-10-CM | POA: Diagnosis not present

## 2022-08-30 DIAGNOSIS — Z5181 Encounter for therapeutic drug level monitoring: Secondary | ICD-10-CM

## 2022-08-30 DIAGNOSIS — Z6838 Body mass index (BMI) 38.0-38.9, adult: Secondary | ICD-10-CM | POA: Diagnosis not present

## 2022-08-30 DIAGNOSIS — R5383 Other fatigue: Secondary | ICD-10-CM | POA: Diagnosis not present

## 2022-08-30 DIAGNOSIS — E669 Obesity, unspecified: Secondary | ICD-10-CM | POA: Diagnosis not present

## 2022-08-30 DIAGNOSIS — M0579 Rheumatoid arthritis with rheumatoid factor of multiple sites without organ or systems involvement: Secondary | ICD-10-CM | POA: Diagnosis not present

## 2022-08-30 DIAGNOSIS — I480 Paroxysmal atrial fibrillation: Secondary | ICD-10-CM | POA: Diagnosis not present

## 2022-08-30 DIAGNOSIS — M48061 Spinal stenosis, lumbar region without neurogenic claudication: Secondary | ICD-10-CM | POA: Diagnosis not present

## 2022-08-30 DIAGNOSIS — Z79899 Other long term (current) drug therapy: Secondary | ICD-10-CM | POA: Diagnosis not present

## 2022-08-30 LAB — POCT INR: INR: 4.8 — AB (ref 2.0–3.0)

## 2022-08-30 NOTE — Patient Instructions (Addendum)
TABLET CHANGED TO 6MG  Hold warfarin tonight then decrease dose to 1 tablet daily except 1 1/2 tablets on Mondays, Wednesdays and Fridays Recheck in 2 wks Lumbar ESI pending. Will hold warfarin 5 days before. Does not need bridging  Starting [facility]

## 2022-09-13 ENCOUNTER — Ambulatory Visit: Payer: Medicare HMO | Attending: Internal Medicine | Admitting: *Deleted

## 2022-09-13 DIAGNOSIS — I480 Paroxysmal atrial fibrillation: Secondary | ICD-10-CM

## 2022-09-13 DIAGNOSIS — Z5181 Encounter for therapeutic drug level monitoring: Secondary | ICD-10-CM | POA: Diagnosis not present

## 2022-09-13 LAB — POCT INR: INR: 2.7 (ref 2.0–3.0)

## 2022-09-13 NOTE — Patient Instructions (Signed)
TABLET CHANGED TO 6MG  Continue warfarin 1 tablet daily except 1 1/2 tablets on Mondays, Wednesdays and Fridays Recheck in 3 wks Lumbar ESI pending. Will hold warfarin 5 days before. Does not need bridging Starting [facility]

## 2022-09-22 ENCOUNTER — Telehealth: Payer: Self-pay | Admitting: Internal Medicine

## 2022-09-22 NOTE — Telephone Encounter (Signed)
Pt called in stating she is unsure how she should be taking he coumadin and would like to speak to [place], [place] about it.

## 2022-09-22 NOTE — Telephone Encounter (Signed)
Called and spoke to pt.  Clarified warfarin schedule with pt.  She has been taking it correctly.  Confirmed appt on 10/04/22.  She appreciated return call.

## 2022-09-30 ENCOUNTER — Other Ambulatory Visit (HOSPITAL_COMMUNITY)
Admission: RE | Admit: 2022-09-30 | Discharge: 2022-09-30 | Disposition: A | Discharge status: Home / Self Care | Payer: Medicare HMO | Source: Ambulatory Visit | Attending: Internal Medicine | Admitting: Internal Medicine

## 2022-09-30 DIAGNOSIS — Z5181 Encounter for therapeutic drug level monitoring: Secondary | ICD-10-CM | POA: Insufficient documentation

## 2022-09-30 DIAGNOSIS — I480 Paroxysmal atrial fibrillation: Secondary | ICD-10-CM | POA: Diagnosis not present

## 2022-09-30 DIAGNOSIS — M48062 Spinal stenosis, lumbar region with neurogenic claudication: Secondary | ICD-10-CM | POA: Diagnosis not present

## 2022-09-30 LAB — PROTIME-INR
INR: 1.1 (ref 0.8–1.2)
Prothrombin Time: 14.5 seconds (ref 11.4–15.2)

## 2022-10-04 ENCOUNTER — Ambulatory Visit (INDEPENDENT_AMBULATORY_CARE_PROVIDER_SITE_OTHER): Payer: Medicare HMO | Admitting: *Deleted

## 2022-10-04 ENCOUNTER — Other Ambulatory Visit: Payer: Self-pay

## 2022-10-04 ENCOUNTER — Telehealth: Payer: Self-pay

## 2022-10-04 ENCOUNTER — Other Ambulatory Visit (HOSPITAL_COMMUNITY)
Admission: RE | Admit: 2022-10-04 | Discharge: 2022-10-04 | Disposition: A | Discharge status: Home / Self Care | Payer: Medicare HMO | Source: Ambulatory Visit | Attending: Internal Medicine | Admitting: Internal Medicine

## 2022-10-04 ENCOUNTER — Ambulatory Visit: Payer: Medicare HMO | Attending: Internal Medicine | Admitting: Internal Medicine

## 2022-10-04 ENCOUNTER — Encounter: Payer: Self-pay | Admitting: Internal Medicine

## 2022-10-04 VITALS — BP 120/64 | HR 63 | Ht 61.0 in | Wt 196.6 lb

## 2022-10-04 DIAGNOSIS — G4733 Obstructive sleep apnea (adult) (pediatric): Secondary | ICD-10-CM | POA: Diagnosis not present

## 2022-10-04 DIAGNOSIS — E785 Hyperlipidemia, unspecified: Secondary | ICD-10-CM | POA: Insufficient documentation

## 2022-10-04 DIAGNOSIS — Z5181 Encounter for therapeutic drug level monitoring: Secondary | ICD-10-CM

## 2022-10-04 DIAGNOSIS — I4892 Unspecified atrial flutter: Secondary | ICD-10-CM | POA: Diagnosis not present

## 2022-10-04 DIAGNOSIS — I480 Paroxysmal atrial fibrillation: Secondary | ICD-10-CM | POA: Diagnosis not present

## 2022-10-04 DIAGNOSIS — E7849 Other hyperlipidemia: Secondary | ICD-10-CM | POA: Diagnosis not present

## 2022-10-04 LAB — TSH: TSH: 0.898 u[IU]/mL (ref 0.350–4.500)

## 2022-10-04 LAB — POCT INR: INR: 1.3 — AB (ref 2.0–3.0)

## 2022-10-04 MED ORDER — DILTIAZEM HCL 30 MG PO TABS: 30.0000 mg | ORAL_TABLET | Freq: Four times a day (QID) | ORAL | 1 refills | Status: AC | PRN

## 2022-10-04 NOTE — Telephone Encounter (Signed)
Per Darreld Mclean in lab, patient needs gold top drawn to do Free T3 and T4, only green top was drawn today.    Left message for patient to return call,she will need to go back to the lab for re-drae

## 2022-10-04 NOTE — Telephone Encounter (Signed)
Patient is returning call. Transferred to Catherine, RN.  

## 2022-10-04 NOTE — Telephone Encounter (Signed)
-----   Message from Marjo Bicker, MD sent at 10/04/2022  1:32 PM EDT ----- TSH is at the lower end of normal.  Obtain free T3 and free T4.

## 2022-10-04 NOTE — Patient Instructions (Signed)
Medication Instructions:  Take Diltiazem 30 mg every 6 hours as needed for palpitations  Labwork: TSH today  Testing/Procedures: Your physician has requested that you have an echocardiogram. Echocardiography is a painless test that uses sound waves to create images of your heart. It provides your doctor with information about the size and shape of your heart and how well your heart's chambers and valves are working. This procedure takes approximately one hour. There are no restrictions for this procedure. Please do NOT wear cologne, perfume, aftershave, or lotions (deodorant is allowed). Please arrive 15 minutes prior to your appointment time.   Follow-Up: 1 year  Any Other Special Instructions Will Be Listed Below (If Applicable).   Itamar Sleep Study  If you need a refill on your cardiac medications before your next appointment, please call your pharmacy.

## 2022-10-04 NOTE — Progress Notes (Signed)
Cardiology Office Note  Date: 10/04/2022   ID: Alexandra [name], DOB 11/07/1945, MRN [id number]  PCP:  Elfredia Nevins, MD  Cardiologist:  Marjo Bicker, MD Electrophysiologist:  None   Reason for Office Visit:Follow-up of paroxysmal atrial flutter   History of Present Illness: Alexandra [name] is a 77 y.o. female known to have HTN, paroxysmal atrial flutter, HLD is here for follow-up visit.  Patient was referred to cardiology clinic 02/2022 for evaluation of A-fib/atrial flutter. Patient had ER visit in 01/2022 for palpitations, EKG showed atrial flutter with RVR in the setting of right middle finger laceration and the rates were controlled with IV Cardizem.  She was discharged on rate controlling agents and systemic anticoagulation.  Upon discharge from the ER, she had 1 more episode of palpitations.  Eliquis was switched to Coumadin due to higher co-pay.  She is here for follow-up visit.  No interval ER visits or hospitalizations.  Has palpitations once a month, last for 10 minutes.  She also has substernal chest pressure which she thinks is from gas because it resolves after passing gas.  Otherwise denied any chest pressure with exertion.  No dizziness, DOE and syncope.  No leg swelling.  Compliant with medications and has no side effects.  Patient was also complaining chest pain the last clinic visit for which NM stress test was ordered but not performed probably due to insurance issues.  Past Medical History:  Diagnosis Date   Depression    Gout    High cholesterol    Hypertension     Past Surgical History:  Procedure Laterality Date   CARPAL TUNNEL RELEASE     CHOLECYSTECTOMY     REPLACEMENT TOTAL KNEE      Current Outpatient Medications  Medication Sig Dispense Refill   albuterol (VENTOLIN HFA) 108 (90 Base) MCG/ACT inhaler Inhale 1-2 puffs into the lungs every 6 (six) hours as needed for wheezing or shortness of breath. 18 g 0   ALPRAZolam (XANAX) 0.5 MG tablet Take  0.5 mg by mouth 3 (three) times daily as needed.     atorvastatin (LIPITOR) 20 MG tablet Take 20 mg by mouth daily.     escitalopram (LEXAPRO) 20 MG tablet Take 20 mg by mouth daily.     Ferrous Sulfate (FEROSUL PO) Take by mouth daily.      folic acid (FOLVITE) 1 MG tablet Take by mouth daily.     lamoTRIgine (LAMICTAL) 150 MG tablet Take 150 mg by mouth daily.     losartan (COZAAR) 25 MG tablet Take 1 tablet (25 mg total) by mouth as directed. hold losartan for 2 weeks, if your top blood pressure number is less than 140, stop losartan. If your top blood pressure number is greater than 140 mg, restart losartan at 25 mg daily 30 tablet 3   methotrexate (50 MG/ML) 1 g injection Inject 20 mg into the vein once a week. SQ     metoprolol tartrate (LOPRESSOR) 50 MG tablet TAKE 1 TABLET (50 MG TOTAL) BY MOUTH 2 (TWO) TIMES DAILY. 180 tablet 3   nitroGLYCERIN (NITROSTAT) 0.4 MG SL tablet Place 1 tablet (0.4 mg total) under the tongue every 5 (five) minutes x 3 doses as needed for chest pain (If no relief after 3rd dose, call 911 or go to ED.). 30 tablet 1   warfarin (COUMADIN) 6 MG tablet Take 1 tablet to 1 1/2 tablets daily or as directed by coumadin clinic 135 tablet 1   No current  facility-administered medications for this visit.   Allergies:  Codeine and Naproxen   Social History: The patient  reports that she has never smoked. She has never used smokeless tobacco. She reports current alcohol use. She reports that she does not use drugs.   Family History: The patient's family history includes Cancer in her paternal grandfather and sister; Coronary artery disease in her brother; Heart attack in her mother; Pulmonary embolism in her father; Stroke in her maternal grandmother.   ROS:  Please see the history of present illness. Otherwise, complete review of systems is positive for none.  All other systems are reviewed and negative.   Physical Exam: VS:  There were no vitals taken for this visit., BMI  There is no height or weight on file to calculate BMI.  Wt Readings from Last 3 Encounters:  03/16/22 191 lb (86.6 kg)  03/08/22 189 lb (85.7 kg)  02/26/22 194 lb (88 kg)    General: Patient appears comfortable at rest. HEENT: Conjunctiva and lids normal, oropharynx clear with moist mucosa. Neck: Supple, no elevated JVP or carotid bruits, no thyromegaly. Lungs: Clear to auscultation, nonlabored breathing at rest. Cardiac: Regular rate and rhythm, no S3 or significant systolic murmur, no pericardial rub. Abdomen: Soft, nontender, no hepatomegaly, bowel sounds present, no guarding or rebound. Extremities: No pitting edema, distal pulses 2+. Skin: Warm and dry. Musculoskeletal: No kyphosis. Neuropsychiatric: Alert and oriented x3, affect grossly appropriate.  Recent Labwork: 02/20/2022: ALT 39; AST 36; BUN 30; Creatinine, Ser 1.19; Hemoglobin 12.0; Platelets 317; Potassium 3.7; Sodium 139  No results found for: "CHOL", "TRIG", "HDL", "CHOLHDL", "VLDL", "LDLCALC", "LDLDIRECT"   Assessment and Plan: Patient is a 77 year old F known to have paroxysmal atrial flutter, HTN, HLD is here for follow-up visit.  # Paroxysmal atrial flutter -EKG from 10/23 showed atrial flutter with RVR but subsequent EKGs on the EMR showed normal sinus rhythm including EKG from the clinic visit today.  Has palpitations once a month, last for 10 minutes. Will start her on diltiazem 30 mg every 6 hours as needed for palpitations in addition to her scheduled medication metoprolol tartrate 50 mg twice daily. -Continue Coumadin with goal INR between 2 and 3.  Follow-up with Coumadin clinic for INR checks.  Previously was on DOAC, Eliquis and had to be switched to Coumadin due to higher co-pay with Eliquis. -Obtain TSH and home sleep study for OSA evaluation. -Obtain 2D echocardiogram  # History of OSA -Patient had OSA in the past and was using CPAP until she got sick and stopped using it.  Home sleep study.  # HTN,  controlled -Continue losartan 25 mg once daily  # HLD -Continue atorvastatin 40 mg at bedtime, goal LDL less than 100.  I have spent a total of 30 minutes with patient reviewing chart, EKGs, labs and examining patient as well as establishing an assessment and plan that was discussed with the patient.  > 50% of time was spent in direct patient care.    Medication Adjustments/Labs and Tests Ordered: Current medicines are reviewed at length with the patient today.  Concerns regarding medicines are outlined above.   Tests Ordered: Orders Placed This Encounter  Procedures   TSH   EKG 12-Lead   ECHOCARDIOGRAM COMPLETE   Itamar Sleep Study    Medication Changes: No orders of the defined types were placed in this encounter.   Disposition:  Follow up  one year  Signed, Travaughn Vue Verne Spurr, MD, 10/04/2022 10:54 AM    [facility] [address], [place], [place] [address]

## 2022-10-04 NOTE — Telephone Encounter (Signed)
Patient agrees to come back to the lab for re-draw.

## 2022-10-04 NOTE — Patient Instructions (Signed)
TABLET CHANGED TO 6MG  Take warfarin 2 tablets tonight then resume 1 tablet daily except 1 1/2 tablets on Mondays, Wednesdays and Fridays Recheck in 1 wk Starting [employer]

## 2022-10-06 ENCOUNTER — Telehealth: Payer: Self-pay | Admitting: *Deleted

## 2022-10-06 ENCOUNTER — Other Ambulatory Visit (HOSPITAL_COMMUNITY)
Admission: RE | Admit: 2022-10-06 | Discharge: 2022-10-06 | Disposition: A | Discharge status: Home / Self Care | Payer: Medicare HMO | Source: Ambulatory Visit | Attending: Internal Medicine | Admitting: Internal Medicine

## 2022-10-06 DIAGNOSIS — I480 Paroxysmal atrial fibrillation: Secondary | ICD-10-CM | POA: Insufficient documentation

## 2022-10-06 LAB — T4, FREE: Free T4: 1.24 ng/dL — ABNORMAL HIGH (ref 0.61–1.12)

## 2022-10-06 NOTE — Telephone Encounter (Signed)
Prior Authorization for ITAMAR sent to [employer] via web portal. Tracking Number .  READY- NO PA REQ 

## 2022-10-06 NOTE — Telephone Encounter (Signed)
Called pt to inform that she can take the home sleep study. Pin number of [id number] given.

## 2022-10-07 LAB — T3, FREE: T3, Free: 2.7 pg/mL (ref 2.0–4.4)

## 2022-10-11 ENCOUNTER — Ambulatory Visit: Payer: Medicare HMO | Attending: Internal Medicine | Admitting: *Deleted

## 2022-10-11 DIAGNOSIS — I480 Paroxysmal atrial fibrillation: Secondary | ICD-10-CM

## 2022-10-11 DIAGNOSIS — Z5181 Encounter for therapeutic drug level monitoring: Secondary | ICD-10-CM | POA: Diagnosis not present

## 2022-10-11 LAB — POCT INR: INR: 2.1 (ref 2.0–3.0)

## 2022-10-11 NOTE — Patient Instructions (Signed)
TABLET CHANGED TO 6MG  Continue warfarin 1 tablet daily except 1 1/2 tablets on Mondays, Wednesdays and Fridays Recheck in 3 wks Started [employer]

## 2022-10-11 NOTE — Progress Notes (Signed)
Order(s) created erroneously. Erroneous order ID: [id number]  Order moved by: Leonia Corona  Order move date/time: 10/11/2022 4:38 PM  Source Patient: [id number]  Source Contact: 10/04/2022  Destination Patient: [id number]  Destination Contact: 10/06/2022

## 2022-10-18 ENCOUNTER — Other Ambulatory Visit: Payer: Self-pay | Admitting: *Deleted

## 2022-10-18 MED ORDER — WARFARIN SODIUM 6 MG PO TABS: ORAL_TABLET | ORAL | 1 refills | Status: DC

## 2022-10-18 NOTE — Telephone Encounter (Signed)
Refill request for warfarin:  Last INR was 2.1 on 10/11/22 Next INR due 11/01/22 LOV was 10/06/22  Refill approved.

## 2022-11-01 ENCOUNTER — Ambulatory Visit: Payer: Medicare HMO | Attending: Internal Medicine | Admitting: *Deleted

## 2022-11-01 DIAGNOSIS — I480 Paroxysmal atrial fibrillation: Secondary | ICD-10-CM | POA: Diagnosis not present

## 2022-11-01 DIAGNOSIS — Z5181 Encounter for therapeutic drug level monitoring: Secondary | ICD-10-CM

## 2022-11-01 LAB — POCT INR: INR: 2.5 (ref 2.0–3.0)

## 2022-11-01 NOTE — Patient Instructions (Signed)
TABLET CHANGED TO 6MG  Continue warfarin 1 tablet daily except 1 1/2 tablets on Mondays, Wednesdays and Fridays Recheck in 4 wks Started [employer]

## 2022-11-15 DIAGNOSIS — M48062 Spinal stenosis, lumbar region with neurogenic claudication: Secondary | ICD-10-CM | POA: Diagnosis not present

## 2022-11-17 ENCOUNTER — Ambulatory Visit (HOSPITAL_COMMUNITY): Payer: Medicare HMO

## 2022-11-29 ENCOUNTER — Ambulatory Visit: Payer: Medicare HMO | Attending: Internal Medicine | Admitting: *Deleted

## 2022-11-29 DIAGNOSIS — I480 Paroxysmal atrial fibrillation: Secondary | ICD-10-CM

## 2022-11-29 DIAGNOSIS — Z5181 Encounter for therapeutic drug level monitoring: Secondary | ICD-10-CM | POA: Diagnosis not present

## 2022-11-29 LAB — POCT INR: INR: 3.2 — AB (ref 2.0–3.0)

## 2022-11-29 NOTE — Patient Instructions (Signed)
TABLET CHANGED TO 6MG  Take warfarin 1/2 tablet tonight then decrease dose to 1 tablet daily except 1 1/2 tablets on Mondays and Fridays Recheck in 3 wks Started [employer].  Has lost 25 lbs

## 2022-11-30 DIAGNOSIS — H8110 Benign paroxysmal vertigo, unspecified ear: Secondary | ICD-10-CM | POA: Diagnosis not present

## 2022-11-30 DIAGNOSIS — N1831 Chronic kidney disease, stage 3a: Secondary | ICD-10-CM | POA: Diagnosis not present

## 2022-11-30 DIAGNOSIS — I10 Essential (primary) hypertension: Secondary | ICD-10-CM | POA: Diagnosis not present

## 2022-11-30 DIAGNOSIS — Z6834 Body mass index (BMI) 34.0-34.9, adult: Secondary | ICD-10-CM | POA: Diagnosis not present

## 2022-11-30 DIAGNOSIS — E6609 Other obesity due to excess calories: Secondary | ICD-10-CM | POA: Diagnosis not present

## 2022-12-20 ENCOUNTER — Ambulatory Visit: Payer: Medicare HMO | Attending: Internal Medicine | Admitting: *Deleted

## 2022-12-20 DIAGNOSIS — I480 Paroxysmal atrial fibrillation: Secondary | ICD-10-CM

## 2022-12-20 DIAGNOSIS — Z5181 Encounter for therapeutic drug level monitoring: Secondary | ICD-10-CM | POA: Diagnosis not present

## 2022-12-20 LAB — POCT INR: INR: 4.1 — AB (ref 2.0–3.0)

## 2022-12-20 NOTE — Patient Instructions (Signed)
Had Covid 2 weeks ago Hold warfarin tonight then decrease dose to 1 tablet daily except 1 1/2 tablets on Mondays Recheck in 3 wks Started [employer].  Has lost 25 lbs

## 2022-12-29 ENCOUNTER — Ambulatory Visit (HOSPITAL_COMMUNITY)
Admission: RE | Admit: 2022-12-29 | Discharge: 2022-12-29 | Disposition: A | Discharge status: Home / Self Care | Payer: Medicare HMO | Source: Ambulatory Visit | Attending: Internal Medicine | Admitting: Internal Medicine

## 2022-12-29 DIAGNOSIS — I4892 Unspecified atrial flutter: Secondary | ICD-10-CM | POA: Diagnosis not present

## 2022-12-29 LAB — ECHOCARDIOGRAM COMPLETE
AR max vel: 2.34 cm2
AV Area VTI: 2.45 cm2
AV Area mean vel: 2.38 cm2
AV Mean grad: 7 mmHg
AV Peak grad: 12.8 mmHg
Ao pk vel: 1.79 m/s
Area-P 1/2: 3.21 cm2
Calc EF: 61.6 %
S' Lateral: 3 cm
Single Plane A2C EF: 59.8 %
Single Plane A4C EF: 62.4 %

## 2022-12-29 NOTE — Progress Notes (Signed)
  Echocardiogram 2D Echocardiogram has been performed.  Alexandra [name] 12/29/2022, 11:28 AM

## 2022-12-30 ENCOUNTER — Telehealth: Payer: Self-pay

## 2022-12-30 NOTE — Telephone Encounter (Signed)
Patient informed and verbalized understanding of plan. 

## 2022-12-30 NOTE — Telephone Encounter (Signed)
-----   Message from Vishnu P Mallipeddi sent at 12/30/2022  9:26 AM EDT ----- Normal pumping function of the heart and no valvular heart disease.

## 2023-01-04 ENCOUNTER — Ambulatory Visit: Payer: Medicare HMO | Attending: Internal Medicine | Admitting: *Deleted

## 2023-01-04 DIAGNOSIS — Z5181 Encounter for therapeutic drug level monitoring: Secondary | ICD-10-CM | POA: Diagnosis not present

## 2023-01-04 DIAGNOSIS — I480 Paroxysmal atrial fibrillation: Secondary | ICD-10-CM | POA: Diagnosis not present

## 2023-01-04 LAB — POCT INR: INR: 2.5 (ref 2.0–3.0)

## 2023-01-04 NOTE — Patient Instructions (Signed)
Continue warfarin 1 tablet daily except 1 1/2 tablets on Mondays  Recheck in 3 wks Started [employer].  Has lost 25 lbs

## 2023-01-25 ENCOUNTER — Ambulatory Visit: Payer: Medicare HMO | Attending: Internal Medicine | Admitting: *Deleted

## 2023-01-25 DIAGNOSIS — I480 Paroxysmal atrial fibrillation: Secondary | ICD-10-CM

## 2023-01-25 DIAGNOSIS — Z5181 Encounter for therapeutic drug level monitoring: Secondary | ICD-10-CM | POA: Diagnosis not present

## 2023-01-25 LAB — POCT INR: INR: 2.8 (ref 2.0–3.0)

## 2023-01-25 NOTE — Patient Instructions (Signed)
Continue warfarin 1 tablet daily except 1 1/2 tablets on Mondays  Recheck in 4 wks Started [employer].  Has lost 25 lbs

## 2023-02-22 ENCOUNTER — Ambulatory Visit: Payer: Medicare HMO | Attending: Internal Medicine | Admitting: *Deleted

## 2023-02-22 DIAGNOSIS — I480 Paroxysmal atrial fibrillation: Secondary | ICD-10-CM | POA: Diagnosis not present

## 2023-02-22 DIAGNOSIS — Z5181 Encounter for therapeutic drug level monitoring: Secondary | ICD-10-CM | POA: Diagnosis not present

## 2023-02-22 LAB — POCT INR: INR: 2.4 (ref 2.0–3.0)

## 2023-02-22 NOTE — Patient Instructions (Signed)
Continue warfarin 1 tablet daily except 1 1/2 tablets on Mondays  Recheck in 6 wks Started [employer].  Has lost 25 lbs

## 2023-02-24 DIAGNOSIS — E782 Mixed hyperlipidemia: Secondary | ICD-10-CM | POA: Diagnosis not present

## 2023-02-24 DIAGNOSIS — I480 Paroxysmal atrial fibrillation: Secondary | ICD-10-CM | POA: Diagnosis not present

## 2023-02-24 DIAGNOSIS — D6869 Other thrombophilia: Secondary | ICD-10-CM | POA: Diagnosis not present

## 2023-03-07 DIAGNOSIS — E669 Obesity, unspecified: Secondary | ICD-10-CM | POA: Diagnosis not present

## 2023-03-07 DIAGNOSIS — Z6833 Body mass index (BMI) 33.0-33.9, adult: Secondary | ICD-10-CM | POA: Diagnosis not present

## 2023-03-07 DIAGNOSIS — M48061 Spinal stenosis, lumbar region without neurogenic claudication: Secondary | ICD-10-CM | POA: Diagnosis not present

## 2023-03-07 DIAGNOSIS — M1991 Primary osteoarthritis, unspecified site: Secondary | ICD-10-CM | POA: Diagnosis not present

## 2023-03-07 DIAGNOSIS — Z79899 Other long term (current) drug therapy: Secondary | ICD-10-CM | POA: Diagnosis not present

## 2023-03-07 DIAGNOSIS — M0579 Rheumatoid arthritis with rheumatoid factor of multiple sites without organ or systems involvement: Secondary | ICD-10-CM | POA: Diagnosis not present

## 2023-03-10 ENCOUNTER — Other Ambulatory Visit: Payer: Self-pay | Admitting: Internal Medicine

## 2023-03-10 DIAGNOSIS — N1831 Chronic kidney disease, stage 3a: Secondary | ICD-10-CM | POA: Diagnosis not present

## 2023-03-10 DIAGNOSIS — Z6833 Body mass index (BMI) 33.0-33.9, adult: Secondary | ICD-10-CM | POA: Diagnosis not present

## 2023-03-10 DIAGNOSIS — G9332 Myalgic encephalomyelitis/chronic fatigue syndrome: Secondary | ICD-10-CM | POA: Diagnosis not present

## 2023-03-10 DIAGNOSIS — I10 Essential (primary) hypertension: Secondary | ICD-10-CM | POA: Diagnosis not present

## 2023-03-10 DIAGNOSIS — D518 Other vitamin B12 deficiency anemias: Secondary | ICD-10-CM | POA: Diagnosis not present

## 2023-03-10 DIAGNOSIS — M25552 Pain in left hip: Secondary | ICD-10-CM | POA: Diagnosis not present

## 2023-03-10 DIAGNOSIS — Z0001 Encounter for general adult medical examination with abnormal findings: Secondary | ICD-10-CM | POA: Diagnosis not present

## 2023-03-10 DIAGNOSIS — E559 Vitamin D deficiency, unspecified: Secondary | ICD-10-CM | POA: Diagnosis not present

## 2023-03-10 DIAGNOSIS — I480 Paroxysmal atrial fibrillation: Secondary | ICD-10-CM | POA: Diagnosis not present

## 2023-03-10 DIAGNOSIS — E6609 Other obesity due to excess calories: Secondary | ICD-10-CM | POA: Diagnosis not present

## 2023-03-10 DIAGNOSIS — Z1331 Encounter for screening for depression: Secondary | ICD-10-CM | POA: Diagnosis not present

## 2023-03-10 DIAGNOSIS — E782 Mixed hyperlipidemia: Secondary | ICD-10-CM | POA: Diagnosis not present

## 2023-03-10 DIAGNOSIS — Z1231 Encounter for screening mammogram for malignant neoplasm of breast: Secondary | ICD-10-CM

## 2023-03-10 DIAGNOSIS — F5101 Primary insomnia: Secondary | ICD-10-CM | POA: Diagnosis not present

## 2023-03-14 ENCOUNTER — Ambulatory Visit
Admission: RE | Admit: 2023-03-14 | Discharge: 2023-03-14 | Disposition: A | Discharge status: Home / Self Care | Payer: Medicare HMO | Source: Ambulatory Visit | Attending: Internal Medicine | Admitting: Internal Medicine

## 2023-03-14 DIAGNOSIS — Z1231 Encounter for screening mammogram for malignant neoplasm of breast: Secondary | ICD-10-CM

## 2023-03-21 DIAGNOSIS — M48062 Spinal stenosis, lumbar region with neurogenic claudication: Secondary | ICD-10-CM | POA: Diagnosis not present

## 2023-03-26 DIAGNOSIS — N1831 Chronic kidney disease, stage 3a: Secondary | ICD-10-CM | POA: Diagnosis not present

## 2023-03-26 DIAGNOSIS — E782 Mixed hyperlipidemia: Secondary | ICD-10-CM | POA: Diagnosis not present

## 2023-03-28 ENCOUNTER — Other Ambulatory Visit: Payer: Self-pay | Admitting: Internal Medicine

## 2023-03-28 NOTE — Telephone Encounter (Signed)
Refill request for warfarin:  Last INR was 2.4 on 02/22/23 Next INR due 04/05/23 LOV was 11/29/22  Refill approved.

## 2023-04-05 ENCOUNTER — Ambulatory Visit: Payer: Medicare HMO | Attending: Internal Medicine | Admitting: *Deleted

## 2023-04-05 ENCOUNTER — Other Ambulatory Visit (HOSPITAL_COMMUNITY): Payer: Self-pay | Admitting: Internal Medicine

## 2023-04-05 DIAGNOSIS — R1310 Dysphagia, unspecified: Secondary | ICD-10-CM

## 2023-04-05 DIAGNOSIS — Z5181 Encounter for therapeutic drug level monitoring: Secondary | ICD-10-CM

## 2023-04-05 DIAGNOSIS — I480 Paroxysmal atrial fibrillation: Secondary | ICD-10-CM

## 2023-04-05 LAB — POCT INR: INR: 2 (ref 2.0–3.0)

## 2023-04-05 NOTE — Patient Instructions (Signed)
Continue warfarin 1 tablet daily except 1 1/2 tablets on Mondays  Recheck in 6 wks Started [employer].  Has lost 25 lbs

## 2023-04-11 ENCOUNTER — Ambulatory Visit (HOSPITAL_COMMUNITY)
Admission: RE | Admit: 2023-04-11 | Discharge: 2023-04-11 | Disposition: A | Discharge status: Home / Self Care | Payer: Medicare HMO | Source: Ambulatory Visit | Attending: Internal Medicine | Admitting: Internal Medicine

## 2023-04-11 DIAGNOSIS — N183 Chronic kidney disease, stage 3 unspecified: Secondary | ICD-10-CM | POA: Diagnosis not present

## 2023-04-11 DIAGNOSIS — K219 Gastro-esophageal reflux disease without esophagitis: Secondary | ICD-10-CM | POA: Diagnosis not present

## 2023-04-11 DIAGNOSIS — R1310 Dysphagia, unspecified: Secondary | ICD-10-CM | POA: Insufficient documentation

## 2023-04-11 DIAGNOSIS — R053 Chronic cough: Secondary | ICD-10-CM | POA: Diagnosis not present

## 2023-04-26 DIAGNOSIS — E782 Mixed hyperlipidemia: Secondary | ICD-10-CM | POA: Diagnosis not present

## 2023-04-26 DIAGNOSIS — I10 Essential (primary) hypertension: Secondary | ICD-10-CM | POA: Diagnosis not present

## 2023-04-26 DIAGNOSIS — N1831 Chronic kidney disease, stage 3a: Secondary | ICD-10-CM | POA: Diagnosis not present

## 2023-05-05 DIAGNOSIS — M65332 Trigger finger, left middle finger: Secondary | ICD-10-CM | POA: Diagnosis not present

## 2023-05-17 ENCOUNTER — Ambulatory Visit: Payer: Medicare HMO | Attending: Internal Medicine | Admitting: *Deleted

## 2023-05-17 DIAGNOSIS — I480 Paroxysmal atrial fibrillation: Secondary | ICD-10-CM

## 2023-05-17 DIAGNOSIS — Z5181 Encounter for therapeutic drug level monitoring: Secondary | ICD-10-CM

## 2023-05-17 LAB — POCT INR: INR: 2.1 (ref 2.0–3.0)

## 2023-05-17 NOTE — Patient Instructions (Signed)
Continue warfarin 1 tablet daily except 1 1/2 tablets on Mondays  Recheck in 6 wks Started [employer].  Has lost 25 lbs

## 2023-05-24 NOTE — Progress Notes (Unsigned)
Alexandra [name], female    DOB: 02-09-1946    MRN: [id number]   Brief patient profile:  57  yowf  croupy cough as child quit smoking 1998 due to a  bad cough then that resolved  s sequelae referred to pulmonary clinic in [facility]  05/25/2023 by [facility]  for recurrent/ chronic cough.    Around 2017  severe cough worse at hs >  ENT  [place]  dx ? 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    Also denies any obvious fluctuation of symptoms with weather or environmental changes or other aggravating or alleviating factors except as outlined above   No unusual exposure hx or h/o childhood pna/ asthma or knowledge of premature birth.  Current Allergies, Complete Past Medical History, Past Surgical History, Family History, and Social History were reviewed in [employer] record.  ROS  The following are not active complaints unless bolded Hoarseness, sore throat, dysphagia, dental problems, itching, sneezing,  nasal congestion or discharge of excess mucus or purulent secretions, ear ache,   fever, chills, sweats, unintended wt loss or wt gain, classically pleuritic or exertional cp,  orthopnea pnd or arm/hand swelling  or leg swelling, presyncope, palpitations, abdominal pain, anorexia, nausea, vomiting, diarrhea  or change in bowel habits or change in bladder habits, change in stools or change in urine, dysuria, hematuria,  rash, arthralgias, visual complaints,  headache, numbness, weakness or ataxia or problems with walking or coordination,  change in mood or  memory.            Outpatient Medications Prior to Visit  Medication Sig Dispense Refill   albuterol (VENTOLIN HFA) 108 (90 Base) MCG/ACT inhaler Inhale 1-2 puffs into the lungs every 6 (six) hours as needed for wheezing or shortness of breath. 18 g 0   ALPRAZolam (XANAX) 0.5 MG tablet Take 0.5 mg by mouth 3 (three) times daily as needed.     atorvastatin (LIPITOR) 20 MG tablet Take 20 mg by mouth daily.     diltiazem (CARDIZEM) 30 MG tablet Take 1 tablet (30 mg total) by mouth 4 (four) times daily as needed. 90 tablet 1   escitalopram (LEXAPRO) 20 MG tablet Take 20 mg by mouth daily.     Ferrous Sulfate (FEROSUL PO) Take by mouth daily.      folic acid (FOLVITE) 1 MG tablet Take by mouth daily.     lamoTRIgine (LAMICTAL) 150 MG tablet Take 150 mg by mouth daily.     losartan (COZAAR) 25 MG tablet Take 1 tablet (25 mg total) by mouth as directed. hold losartan for 2 weeks, if your top blood pressure number is less than 140, stop losartan. If your top blood pressure number is greater than 140 mg, restart losartan at 25 mg daily 30 tablet 3   methotrexate (50 MG/ML) 1 g injection Inject 20 mg into  the vein once a week. SQ     metoprolol tartrate (LOPRESSOR) 50 MG tablet TAKE 1 TABLET (50 MG TOTAL) BY MOUTH 2 (TWO) TIMES DAILY. 180 tablet 3   warfarin (COUMADIN) 6 MG tablet TAKE 1 TABLET TO 1 AND 1/2 TABLETS EVERY DAY OR AS DIRECTED BY COUMADIN CLINIC 135 tablet 1   nitroGLYCERIN (NITROSTAT) 0.4 MG SL tablet Place 1 tablet (0.4 mg total) under the tongue every 5 (five) minutes x 3 doses as needed for chest pain (If no relief after 3rd dose, call 911 or go to ED.). 30 tablet 1   No facility-administered medications prior to visit.    Past Medical History:  Diagnosis Date   Depression    Gout    High cholesterol    Hypertension       Objective:     BP 119/70   Pulse 85   Ht 5\' 1"   (1.549 m)   Wt 172 lb (78 kg)   SpO2 94% Comment: room air  BMI 32.50 kg/m   SpO2: 94 % (room air) Amb pleasant wf nad/  rattling cough    HEENT : Oropharynx  clear / full dentures      Nasal turbinates mod edema/ MP secretions   NECK :  without  apparent JVD/ palpable Nodes/TM    LUNGS: no acc muscle use,  Nl contour chest which is clear to A and P bilaterally without cough on insp or exp maneuvers   CV:  RRR  no s3 or murmur or increase in P2, and no edema   ABD:  soft and nontender   MS:  Gait nl   ext warm without deformities Or obvious joint restrictions  calf tenderness, cyanosis or clubbing    SKIN: warm and dry without lesions    NEURO:  alert, approp, nl sensorium with  no motor or cerebellar deficits apparent.   CXR PA and Lateral:   05/25/2023 :    I personally reviewed images and impression is as follows:     Minimal non-specific increase in markings     Assessment   Upper airway cough syndrome Onset 2017 with ENT eval pos for GERD  - Sinus CT 05/25/2023 >>>  Upper airway cough syndrome (previously labeled PNDS),  is so named because it's frequently impossible to sort out how much is  CR/sinusitis with freq throat clearing (which can be related to primary GERD)   vs  causing  secondary (" extra esophageal")  GERD from wide swings in gastric pressure that occur with throat clearing, often  promoting self use of mint and menthol lozenges that reduce the lower esophageal sphincter tone and exacerbate the problem further in a cyclical fashion.   These are the same pts (now being labeled as having "irritable larynx syndrome" by some cough centers) who not infrequently have a history of having failed to tolerate ace inhibitors,  dry powder inhalers or biphosphonates or report having atypical/extraesophageal reflux symptoms(eg LPR) that don't respond to standard doses of PPI  and are easily confused as having aecopd or asthma flares by even experienced allergists/  pulmonologists (myself included).   The most striking abnormality on exam was her nasal exam with extensive swelling and MP secretions L > R   Since she failed to clear with prior abx rec proceed to sinus first and empirically rx for both acid reflux and non -acid reflux with approp diet/ lifestyle changes          Each maintenance medication was  reviewed in detail including emphasizing most importantly the difference between maintenance and prns and under what circumstances the prns are to be triggered using an action plan format where appropriate.  Total time for H and P, chart review, counseling,  and generating customized AVS unique to this office visit / same day charting  > 45 min new pt with refractory respiratory  symptoms of uncertain etiology.           Sandrea Hughs, MD 05/25/2023

## 2023-05-25 ENCOUNTER — Encounter: Payer: Self-pay | Admitting: Internal Medicine

## 2023-05-25 ENCOUNTER — Ambulatory Visit (HOSPITAL_COMMUNITY)
Admission: RE | Admit: 2023-05-25 | Discharge: 2023-05-25 | Disposition: A | Discharge status: Home / Self Care | Payer: Medicare HMO | Source: Ambulatory Visit | Attending: Internal Medicine | Admitting: Internal Medicine

## 2023-05-25 ENCOUNTER — Ambulatory Visit: Payer: Medicare HMO | Admitting: Internal Medicine

## 2023-05-25 VITALS — BP 119/70 | HR 85 | Ht 61.0 in | Wt 172.0 lb

## 2023-05-25 DIAGNOSIS — R058 Other specified cough: Secondary | ICD-10-CM | POA: Diagnosis not present

## 2023-05-25 DIAGNOSIS — R059 Cough, unspecified: Secondary | ICD-10-CM | POA: Diagnosis not present

## 2023-05-25 DIAGNOSIS — I700 Atherosclerosis of aorta: Secondary | ICD-10-CM | POA: Diagnosis not present

## 2023-05-25 DIAGNOSIS — R918 Other nonspecific abnormal finding of lung field: Secondary | ICD-10-CM | POA: Diagnosis not present

## 2023-05-25 MED ORDER — FAMOTIDINE 20 MG PO TABS: ORAL_TABLET | ORAL | 11 refills | Status: DC

## 2023-05-25 MED ORDER — PANTOPRAZOLE SODIUM 40 MG PO TBEC: 40.0000 mg | DELAYED_RELEASE_TABLET | Freq: Every day | ORAL | 2 refills | Status: AC

## 2023-05-25 NOTE — Assessment & Plan Note (Addendum)
Onset 2017 with ENT eval pos for GERD  - Sinus CT 05/25/2023 >>>  Upper airway cough syndrome (previously labeled PNDS),  is so named because it's frequently impossible to sort out how much is  CR/sinusitis with freq throat clearing (which can be related to primary GERD)   vs  causing  secondary (" extra esophageal")  GERD from wide swings in gastric pressure that occur with throat clearing, often  promoting self use of mint and menthol lozenges that reduce the lower esophageal sphincter tone and exacerbate the problem further in a cyclical fashion.   These are the same pts (now being labeled as having "irritable larynx syndrome" by some cough centers) who not infrequently have a history of having failed to tolerate ace inhibitors,  dry powder inhalers or biphosphonates or report having atypical/extraesophageal reflux symptoms(eg LPR) that don't respond to standard doses of PPI  and are easily confused as having aecopd or asthma flares by even experienced allergists/ pulmonologists (myself included).   The most striking abnormality on exam was her nasal exam with extensive swelling and MP secretions L > R   Since she failed to clear with prior abx rec proceed to sinus first and empirically rx for both acid reflux and non -acid reflux with approp diet/ lifestyle changes          Each maintenance medication was reviewed in detail including emphasizing most importantly the difference between maintenance and prns and under what circumstances the prns are to be triggered using an action plan format where appropriate.  Total time for H and P, chart review, counseling,  and generating customized AVS unique to this office visit / same day charting  > 45 min new pt with refractory respiratory  symptoms of uncertain etiology.

## 2023-05-25 NOTE — Patient Instructions (Addendum)
Pantoprazole (protonix) 40 mg   Take  30-60 min before first meal of the day and Pepcid (famotidine)  20 mg after supper until return to office - this is the best way to tell whether stomach acid is contributing to your problem.   You will need your blood checked in 2 weeks after you start this due to taking methotrexate also - I will contact Dr Nickola Major  GERD (REFLUX)  is an extremely common cause of respiratory symptoms just like yours , many times with no obvious heartburn at all.    It can be treated with medication, but also with lifestyle changes including elevation of the head of your bed (ideally with 6 -8inch blocks under the headboard of your bed),  Smoking cessation, avoidance of late meals, excessive alcohol, and avoid fatty foods, chocolate, peppermint, colas, red wine, and acidic juices such as orange juice.  NO MINT OR MENTHOL PRODUCTS SO NO COUGH DROPS  USE SUGARLESS CANDY INSTEAD (Jolley ranchers or Stover's or Life Savers) or even ice chips will also do - the key is to swallow to prevent all throat clearing. NO OIL BASED VITAMINS - use powdered substitutes.  Avoid fish oil when coughing.    My office will be contacting you by phone for referral for SInus  CT  - if you don't hear back from my office within one week please call us back or notify us thru MyChart and we'll address it right away.   Please remember to go to the  x-ray department  @  [facility] for your tests - we will call you with the results when they are available

## 2023-05-26 ENCOUNTER — Institutional Professional Consult (permissible substitution): Payer: Medicare HMO | Admitting: Internal Medicine

## 2023-05-27 DIAGNOSIS — N1831 Chronic kidney disease, stage 3a: Secondary | ICD-10-CM | POA: Diagnosis not present

## 2023-05-27 DIAGNOSIS — E782 Mixed hyperlipidemia: Secondary | ICD-10-CM | POA: Diagnosis not present

## 2023-05-27 DIAGNOSIS — I10 Essential (primary) hypertension: Secondary | ICD-10-CM | POA: Diagnosis not present

## 2023-05-30 ENCOUNTER — Telehealth: Payer: Self-pay | Admitting: Internal Medicine

## 2023-05-30 NOTE — Telephone Encounter (Signed)
[facility] Benef[facility] Rep calling after hours answ service.   "CT Scan at [facility] PMT 325 Requestiong New Location for CT w/Lower co-pay."

## 2023-05-31 ENCOUNTER — Ambulatory Visit (HOSPITAL_COMMUNITY): Payer: Medicare HMO

## 2023-05-31 NOTE — Telephone Encounter (Signed)
Called pt and rescheduled CT to GI   Pt wanted to cancel CT for now, her son passed away   Would like to schedule later

## 2023-05-31 NOTE — Telephone Encounter (Signed)
If you are referring to sinus ct then that's fine can reschedule when ready

## 2023-06-02 NOTE — Telephone Encounter (Signed)
 NFN

## 2023-06-03 ENCOUNTER — Other Ambulatory Visit: Payer: Medicare HMO

## 2023-06-08 DIAGNOSIS — E119 Type 2 diabetes mellitus without complications: Secondary | ICD-10-CM | POA: Diagnosis not present

## 2023-06-15 DIAGNOSIS — M48061 Spinal stenosis, lumbar region without neurogenic claudication: Secondary | ICD-10-CM | POA: Diagnosis not present

## 2023-06-15 DIAGNOSIS — M1991 Primary osteoarthritis, unspecified site: Secondary | ICD-10-CM | POA: Diagnosis not present

## 2023-06-15 DIAGNOSIS — Z6832 Body mass index (BMI) 32.0-32.9, adult: Secondary | ICD-10-CM | POA: Diagnosis not present

## 2023-06-15 DIAGNOSIS — Z79899 Other long term (current) drug therapy: Secondary | ICD-10-CM | POA: Diagnosis not present

## 2023-06-15 DIAGNOSIS — M0579 Rheumatoid arthritis with rheumatoid factor of multiple sites without organ or systems involvement: Secondary | ICD-10-CM | POA: Diagnosis not present

## 2023-06-15 DIAGNOSIS — E669 Obesity, unspecified: Secondary | ICD-10-CM | POA: Diagnosis not present

## 2023-06-22 ENCOUNTER — Other Ambulatory Visit: Payer: Medicare HMO

## 2023-06-24 DIAGNOSIS — I480 Paroxysmal atrial fibrillation: Secondary | ICD-10-CM | POA: Diagnosis not present

## 2023-06-24 DIAGNOSIS — E782 Mixed hyperlipidemia: Secondary | ICD-10-CM | POA: Diagnosis not present

## 2023-06-24 DIAGNOSIS — D6869 Other thrombophilia: Secondary | ICD-10-CM | POA: Diagnosis not present

## 2023-06-28 ENCOUNTER — Ambulatory Visit: Payer: Medicare HMO | Attending: Internal Medicine | Admitting: *Deleted

## 2023-06-28 DIAGNOSIS — I480 Paroxysmal atrial fibrillation: Secondary | ICD-10-CM

## 2023-06-28 DIAGNOSIS — Z5181 Encounter for therapeutic drug level monitoring: Secondary | ICD-10-CM

## 2023-06-28 LAB — POCT INR: INR: 1.7 — AB (ref 2.0–3.0)

## 2023-06-28 NOTE — Patient Instructions (Signed)
 Take warfarin 1 1/2 tablets tonight and tomorrow night then resume 1 tablet daily except 1 1/2 tablets on Mondays  Recheck in 2 wks Started Mounjaro.  Has lost 25 lbs

## 2023-07-06 ENCOUNTER — Ambulatory Visit: Payer: Medicare HMO | Admitting: Internal Medicine

## 2023-07-12 ENCOUNTER — Encounter

## 2023-07-13 ENCOUNTER — Ambulatory Visit: Attending: Internal Medicine | Admitting: *Deleted

## 2023-07-13 DIAGNOSIS — I480 Paroxysmal atrial fibrillation: Secondary | ICD-10-CM | POA: Diagnosis not present

## 2023-07-13 DIAGNOSIS — Z5181 Encounter for therapeutic drug level monitoring: Secondary | ICD-10-CM | POA: Diagnosis not present

## 2023-07-13 LAB — POCT INR: INR: 3 (ref 2.0–3.0)

## 2023-07-13 NOTE — Patient Instructions (Signed)
Continue warfarin 1 tablet daily except 1 1/2 tablets on Mondays  Recheck in 4 wks Started [employer].  Has lost 25 lbs

## 2023-08-10 ENCOUNTER — Ambulatory Visit: Attending: Internal Medicine | Admitting: *Deleted

## 2023-08-10 DIAGNOSIS — Z5181 Encounter for therapeutic drug level monitoring: Secondary | ICD-10-CM | POA: Diagnosis not present

## 2023-08-10 DIAGNOSIS — I480 Paroxysmal atrial fibrillation: Secondary | ICD-10-CM

## 2023-08-10 LAB — POCT INR: INR: 2.4 (ref 2.0–3.0)

## 2023-08-10 NOTE — Patient Instructions (Signed)
Continue warfarin 1 tablet daily except 1 1/2 tablets on Mondays  Recheck in 4 wks Started [employer].  Has lost 25 lbs

## 2023-08-24 DIAGNOSIS — N1831 Chronic kidney disease, stage 3a: Secondary | ICD-10-CM | POA: Diagnosis not present

## 2023-08-24 DIAGNOSIS — I10 Essential (primary) hypertension: Secondary | ICD-10-CM | POA: Diagnosis not present

## 2023-08-24 DIAGNOSIS — I480 Paroxysmal atrial fibrillation: Secondary | ICD-10-CM | POA: Diagnosis not present

## 2023-08-24 DIAGNOSIS — D6869 Other thrombophilia: Secondary | ICD-10-CM | POA: Diagnosis not present

## 2023-09-07 ENCOUNTER — Ambulatory Visit: Attending: Internal Medicine | Admitting: *Deleted

## 2023-09-07 DIAGNOSIS — I480 Paroxysmal atrial fibrillation: Secondary | ICD-10-CM | POA: Diagnosis not present

## 2023-09-07 DIAGNOSIS — Z5181 Encounter for therapeutic drug level monitoring: Secondary | ICD-10-CM

## 2023-09-07 LAB — POCT INR: INR: 1.6 — AB (ref 2.0–3.0)

## 2023-09-07 NOTE — Patient Instructions (Signed)
 Take warfarin 1 1/2 tablets tonight and tomorrow night then resume 1 tablet daily except 1 1/2 tablets on Mondays  Recheck in 3 wks Started Ozempic.  Has lost 25 lbs

## 2023-09-12 ENCOUNTER — Other Ambulatory Visit: Payer: Self-pay | Admitting: Internal Medicine

## 2023-09-12 NOTE — Telephone Encounter (Signed)
 Refill request for warfarin:  Last INR was 1.6 on 09/07/23 Next INR due 09/28/23 LOV was 10/04/22    Refill approved.

## 2023-09-13 DIAGNOSIS — Z6831 Body mass index (BMI) 31.0-31.9, adult: Secondary | ICD-10-CM | POA: Diagnosis not present

## 2023-09-13 DIAGNOSIS — E669 Obesity, unspecified: Secondary | ICD-10-CM | POA: Diagnosis not present

## 2023-09-13 DIAGNOSIS — M48061 Spinal stenosis, lumbar region without neurogenic claudication: Secondary | ICD-10-CM | POA: Diagnosis not present

## 2023-09-13 DIAGNOSIS — Z79899 Other long term (current) drug therapy: Secondary | ICD-10-CM | POA: Diagnosis not present

## 2023-09-13 DIAGNOSIS — M1991 Primary osteoarthritis, unspecified site: Secondary | ICD-10-CM | POA: Diagnosis not present

## 2023-09-13 DIAGNOSIS — M0579 Rheumatoid arthritis with rheumatoid factor of multiple sites without organ or systems involvement: Secondary | ICD-10-CM | POA: Diagnosis not present

## 2023-09-15 DIAGNOSIS — R0989 Other specified symptoms and signs involving the circulatory and respiratory systems: Secondary | ICD-10-CM | POA: Diagnosis not present

## 2023-09-15 DIAGNOSIS — R053 Chronic cough: Secondary | ICD-10-CM | POA: Insufficient documentation

## 2023-09-15 DIAGNOSIS — K219 Gastro-esophageal reflux disease without esophagitis: Secondary | ICD-10-CM | POA: Diagnosis not present

## 2023-09-24 DIAGNOSIS — E782 Mixed hyperlipidemia: Secondary | ICD-10-CM | POA: Diagnosis not present

## 2023-09-24 DIAGNOSIS — E6609 Other obesity due to excess calories: Secondary | ICD-10-CM | POA: Diagnosis not present

## 2023-09-24 DIAGNOSIS — N183 Chronic kidney disease, stage 3 unspecified: Secondary | ICD-10-CM | POA: Diagnosis not present

## 2023-09-24 DIAGNOSIS — K219 Gastro-esophageal reflux disease without esophagitis: Secondary | ICD-10-CM | POA: Diagnosis not present

## 2023-09-28 ENCOUNTER — Other Ambulatory Visit: Payer: Self-pay | Admitting: Internal Medicine

## 2023-09-28 ENCOUNTER — Encounter

## 2023-09-28 DIAGNOSIS — R0989 Other specified symptoms and signs involving the circulatory and respiratory systems: Secondary | ICD-10-CM | POA: Diagnosis not present

## 2023-09-28 DIAGNOSIS — J342 Deviated nasal septum: Secondary | ICD-10-CM | POA: Diagnosis not present

## 2023-09-28 DIAGNOSIS — R053 Chronic cough: Secondary | ICD-10-CM | POA: Diagnosis not present

## 2023-09-28 DIAGNOSIS — E119 Type 2 diabetes mellitus without complications: Secondary | ICD-10-CM | POA: Diagnosis not present

## 2023-09-28 DIAGNOSIS — K219 Gastro-esophageal reflux disease without esophagitis: Secondary | ICD-10-CM | POA: Diagnosis not present

## 2023-09-28 DIAGNOSIS — J329 Chronic sinusitis, unspecified: Secondary | ICD-10-CM | POA: Diagnosis not present

## 2023-09-28 DIAGNOSIS — J3489 Other specified disorders of nose and nasal sinuses: Secondary | ICD-10-CM | POA: Diagnosis not present

## 2023-10-03 ENCOUNTER — Ambulatory Visit: Attending: Internal Medicine | Admitting: *Deleted

## 2023-10-03 DIAGNOSIS — I480 Paroxysmal atrial fibrillation: Secondary | ICD-10-CM | POA: Diagnosis not present

## 2023-10-03 DIAGNOSIS — Z5181 Encounter for therapeutic drug level monitoring: Secondary | ICD-10-CM

## 2023-10-03 LAB — POCT INR: INR: 1.5 — AB (ref 2.0–3.0)

## 2023-10-03 NOTE — Patient Instructions (Signed)
 Take warfarin 2 tablets tonight then increase dose to 1 1/2 tablets daily except 1 tablet on Sundays, Tuesdays and Thursdays Recheck in 1 wk Started Ozempic.  Has lost 25 lbs

## 2023-10-12 ENCOUNTER — Ambulatory Visit: Attending: Internal Medicine

## 2023-10-12 DIAGNOSIS — I480 Paroxysmal atrial fibrillation: Secondary | ICD-10-CM

## 2023-10-12 DIAGNOSIS — Z5181 Encounter for therapeutic drug level monitoring: Secondary | ICD-10-CM

## 2023-10-12 LAB — POCT INR: INR: 2.5 (ref 2.0–3.0)

## 2023-10-12 NOTE — Patient Instructions (Signed)
 Description   Continue on same dosage of Warfarin 1 1/2 tablets daily except 1 tablet on Sundays, Tuesdays and Thursdays Recheck in 3 weeks Started Ozempic.  Has lost 25 lbs

## 2023-10-19 ENCOUNTER — Other Ambulatory Visit: Payer: Self-pay | Admitting: Internal Medicine

## 2023-10-25 DIAGNOSIS — M159 Polyosteoarthritis, unspecified: Secondary | ICD-10-CM | POA: Diagnosis not present

## 2023-10-25 DIAGNOSIS — I10 Essential (primary) hypertension: Secondary | ICD-10-CM | POA: Diagnosis not present

## 2023-10-25 DIAGNOSIS — I480 Paroxysmal atrial fibrillation: Secondary | ICD-10-CM | POA: Diagnosis not present

## 2023-10-25 DIAGNOSIS — G9332 Myalgic encephalomyelitis/chronic fatigue syndrome: Secondary | ICD-10-CM | POA: Diagnosis not present

## 2023-10-25 DIAGNOSIS — Z0001 Encounter for general adult medical examination with abnormal findings: Secondary | ICD-10-CM | POA: Diagnosis not present

## 2023-10-25 DIAGNOSIS — Z9229 Personal history of other drug therapy: Secondary | ICD-10-CM | POA: Diagnosis not present

## 2023-10-25 DIAGNOSIS — M069 Rheumatoid arthritis, unspecified: Secondary | ICD-10-CM | POA: Diagnosis not present

## 2023-10-25 DIAGNOSIS — E6609 Other obesity due to excess calories: Secondary | ICD-10-CM | POA: Diagnosis not present

## 2023-10-25 DIAGNOSIS — Z6830 Body mass index (BMI) 30.0-30.9, adult: Secondary | ICD-10-CM | POA: Diagnosis not present

## 2023-10-25 DIAGNOSIS — N1831 Chronic kidney disease, stage 3a: Secondary | ICD-10-CM | POA: Diagnosis not present

## 2023-11-02 ENCOUNTER — Ambulatory Visit

## 2023-11-02 DIAGNOSIS — M65332 Trigger finger, left middle finger: Secondary | ICD-10-CM | POA: Diagnosis not present

## 2023-11-07 ENCOUNTER — Ambulatory Visit: Attending: Internal Medicine | Admitting: *Deleted

## 2023-11-07 DIAGNOSIS — Z5181 Encounter for therapeutic drug level monitoring: Secondary | ICD-10-CM

## 2023-11-07 DIAGNOSIS — I480 Paroxysmal atrial fibrillation: Secondary | ICD-10-CM

## 2023-11-07 LAB — POCT INR: INR: 5.1 — AB (ref 2.0–3.0)

## 2023-11-07 NOTE — Patient Instructions (Signed)
 Hold warfarin tonight and tomorrow night then decrease dose to 1 tablet daily except 1 1/2 tablets on Wednesdays and Saturdays Recheck in 1 week

## 2023-11-07 NOTE — Progress Notes (Signed)
Please see anticoagulation encounter.

## 2023-11-09 ENCOUNTER — Telehealth: Payer: Self-pay | Admitting: Internal Medicine

## 2023-11-09 NOTE — Telephone Encounter (Signed)
 Called patient.  Symptoms she described did not sound like they were related to her warfarin.  Her INR was elevated Monday but warfarin has been held 2 days.  Did explain that some pt's report feeling a little dizzy when INR's have been too thin.  She has a repeat INR appt on Tuesday.  Told her if symptoms continue she will need to contact her PCP.  She verbalized understanding and appreciated call back.

## 2023-11-09 NOTE — Telephone Encounter (Signed)
 Pt c/o medication issue:  1. Name of Medication:  warfarin (COUMADIN ) 6 MG tablet  2. How are you currently taking this medication (dosage and times per day)?   3. Are you having a reaction (difficulty breathing--STAT)?   4. What is your medication issue?   Patient says since recently changing Warfarin she has been off-balance--not quite a dizziness. Please advise.

## 2023-11-14 ENCOUNTER — Encounter

## 2023-11-15 ENCOUNTER — Ambulatory Visit: Attending: Internal Medicine | Admitting: *Deleted

## 2023-11-15 DIAGNOSIS — I480 Paroxysmal atrial fibrillation: Secondary | ICD-10-CM | POA: Diagnosis not present

## 2023-11-15 DIAGNOSIS — Z5181 Encounter for therapeutic drug level monitoring: Secondary | ICD-10-CM

## 2023-11-15 LAB — POCT INR: INR: 2.2 (ref 2.0–3.0)

## 2023-11-15 NOTE — Patient Instructions (Signed)
 Continue warfarin 1 tablet daily except 1 1/2 tablets on Wednesdays and Saturdays Recheck in 2 weeks

## 2023-11-15 NOTE — Progress Notes (Signed)
Please see anticoagulation encounter.

## 2023-11-18 ENCOUNTER — Emergency Department (HOSPITAL_COMMUNITY)

## 2023-11-18 ENCOUNTER — Encounter (HOSPITAL_COMMUNITY): Payer: Self-pay

## 2023-11-18 ENCOUNTER — Inpatient Hospital Stay (HOSPITAL_COMMUNITY)
Admission: EM | Admit: 2023-11-18 | Discharge: 2023-11-23 | DRG: 481 | Disposition: A | Discharge status: Skilled Nursing Facility | Attending: Family Medicine | Admitting: Family Medicine

## 2023-11-18 ENCOUNTER — Other Ambulatory Visit: Payer: Self-pay

## 2023-11-18 DIAGNOSIS — F419 Anxiety disorder, unspecified: Secondary | ICD-10-CM | POA: Diagnosis present

## 2023-11-18 DIAGNOSIS — R2689 Other abnormalities of gait and mobility: Secondary | ICD-10-CM | POA: Diagnosis not present

## 2023-11-18 DIAGNOSIS — Z886 Allergy status to analgesic agent status: Secondary | ICD-10-CM | POA: Diagnosis not present

## 2023-11-18 DIAGNOSIS — E871 Hypo-osmolality and hyponatremia: Secondary | ICD-10-CM | POA: Diagnosis present

## 2023-11-18 DIAGNOSIS — Z885 Allergy status to narcotic agent status: Secondary | ICD-10-CM | POA: Diagnosis not present

## 2023-11-18 DIAGNOSIS — S72009A Fracture of unspecified part of neck of unspecified femur, initial encounter for closed fracture: Secondary | ICD-10-CM | POA: Diagnosis present

## 2023-11-18 DIAGNOSIS — Z7401 Bed confinement status: Secondary | ICD-10-CM | POA: Diagnosis not present

## 2023-11-18 DIAGNOSIS — Z01818 Encounter for other preprocedural examination: Secondary | ICD-10-CM | POA: Diagnosis not present

## 2023-11-18 DIAGNOSIS — R110 Nausea: Secondary | ICD-10-CM | POA: Diagnosis present

## 2023-11-18 DIAGNOSIS — S72142A Displaced intertrochanteric fracture of left femur, initial encounter for closed fracture: Secondary | ICD-10-CM | POA: Diagnosis present

## 2023-11-18 DIAGNOSIS — W1809XA Striking against other object with subsequent fall, initial encounter: Secondary | ICD-10-CM | POA: Diagnosis present

## 2023-11-18 DIAGNOSIS — R2681 Unsteadiness on feet: Secondary | ICD-10-CM | POA: Diagnosis not present

## 2023-11-18 DIAGNOSIS — Z96659 Presence of unspecified artificial knee joint: Secondary | ICD-10-CM | POA: Diagnosis present

## 2023-11-18 DIAGNOSIS — W19XXXA Unspecified fall, initial encounter: Secondary | ICD-10-CM

## 2023-11-18 DIAGNOSIS — Z79899 Other long term (current) drug therapy: Secondary | ICD-10-CM | POA: Diagnosis not present

## 2023-11-18 DIAGNOSIS — E876 Hypokalemia: Secondary | ICD-10-CM | POA: Diagnosis present

## 2023-11-18 DIAGNOSIS — I119 Hypertensive heart disease without heart failure: Secondary | ICD-10-CM | POA: Diagnosis not present

## 2023-11-18 DIAGNOSIS — S72002A Fracture of unspecified part of neck of left femur, initial encounter for closed fracture: Principal | ICD-10-CM

## 2023-11-18 DIAGNOSIS — S72009D Fracture of unspecified part of neck of unspecified femur, subsequent encounter for closed fracture with routine healing: Secondary | ICD-10-CM | POA: Diagnosis not present

## 2023-11-18 DIAGNOSIS — R9089 Other abnormal findings on diagnostic imaging of central nervous system: Secondary | ICD-10-CM | POA: Diagnosis not present

## 2023-11-18 DIAGNOSIS — S0990XA Unspecified injury of head, initial encounter: Secondary | ICD-10-CM | POA: Diagnosis present

## 2023-11-18 DIAGNOSIS — M109 Gout, unspecified: Secondary | ICD-10-CM | POA: Diagnosis not present

## 2023-11-18 DIAGNOSIS — M25552 Pain in left hip: Secondary | ICD-10-CM | POA: Diagnosis not present

## 2023-11-18 DIAGNOSIS — Z7901 Long term (current) use of anticoagulants: Secondary | ICD-10-CM

## 2023-11-18 DIAGNOSIS — S72002D Fracture of unspecified part of neck of left femur, subsequent encounter for closed fracture with routine healing: Secondary | ICD-10-CM | POA: Diagnosis not present

## 2023-11-18 DIAGNOSIS — R531 Weakness: Secondary | ICD-10-CM | POA: Diagnosis not present

## 2023-11-18 DIAGNOSIS — I4891 Unspecified atrial fibrillation: Secondary | ICD-10-CM | POA: Diagnosis not present

## 2023-11-18 DIAGNOSIS — Z823 Family history of stroke: Secondary | ICD-10-CM | POA: Diagnosis not present

## 2023-11-18 DIAGNOSIS — Z8249 Family history of ischemic heart disease and other diseases of the circulatory system: Secondary | ICD-10-CM | POA: Diagnosis not present

## 2023-11-18 DIAGNOSIS — G4733 Obstructive sleep apnea (adult) (pediatric): Secondary | ICD-10-CM | POA: Diagnosis not present

## 2023-11-18 DIAGNOSIS — F32A Depression, unspecified: Secondary | ICD-10-CM | POA: Diagnosis present

## 2023-11-18 DIAGNOSIS — I10 Essential (primary) hypertension: Secondary | ICD-10-CM | POA: Diagnosis present

## 2023-11-18 DIAGNOSIS — I480 Paroxysmal atrial fibrillation: Secondary | ICD-10-CM | POA: Diagnosis present

## 2023-11-18 DIAGNOSIS — R509 Fever, unspecified: Secondary | ICD-10-CM | POA: Diagnosis present

## 2023-11-18 DIAGNOSIS — M6281 Muscle weakness (generalized): Secondary | ICD-10-CM | POA: Diagnosis not present

## 2023-11-18 DIAGNOSIS — Y92000 Kitchen of unspecified non-institutional (private) residence as  the place of occurrence of the external cause: Secondary | ICD-10-CM | POA: Diagnosis not present

## 2023-11-18 DIAGNOSIS — M069 Rheumatoid arthritis, unspecified: Secondary | ICD-10-CM | POA: Diagnosis present

## 2023-11-18 DIAGNOSIS — S199XXA Unspecified injury of neck, initial encounter: Secondary | ICD-10-CM | POA: Diagnosis not present

## 2023-11-18 DIAGNOSIS — E7800 Pure hypercholesterolemia, unspecified: Secondary | ICD-10-CM | POA: Diagnosis present

## 2023-11-18 DIAGNOSIS — E041 Nontoxic single thyroid nodule: Secondary | ICD-10-CM | POA: Diagnosis not present

## 2023-11-18 DIAGNOSIS — F39 Unspecified mood [affective] disorder: Secondary | ICD-10-CM | POA: Diagnosis not present

## 2023-11-18 LAB — CBC WITH DIFFERENTIAL/PLATELET
Abs Immature Granulocytes: 0.05 K/uL (ref 0.00–0.07)
Basophils Absolute: 0.1 K/uL (ref 0.0–0.1)
Basophils Relative: 1 %
Eosinophils Absolute: 0.2 K/uL (ref 0.0–0.5)
Eosinophils Relative: 2 %
HCT: 33 % — ABNORMAL LOW (ref 36.0–46.0)
Hemoglobin: 11.5 g/dL — ABNORMAL LOW (ref 12.0–15.0)
Immature Granulocytes: 1 %
Lymphocytes Relative: 16 %
Lymphs Abs: 1.5 K/uL (ref 0.7–4.0)
MCH: 31.6 pg (ref 26.0–34.0)
MCHC: 34.8 g/dL (ref 30.0–36.0)
MCV: 90.7 fL (ref 80.0–100.0)
Monocytes Absolute: 0.7 K/uL (ref 0.1–1.0)
Monocytes Relative: 7 %
Neutro Abs: 7.2 K/uL (ref 1.7–7.7)
Neutrophils Relative %: 73 %
Platelets: 245 K/uL (ref 150–400)
RBC: 3.64 MIL/uL — ABNORMAL LOW (ref 3.87–5.11)
RDW: 13.6 % (ref 11.5–15.5)
WBC: 9.7 K/uL (ref 4.0–10.5)
nRBC: 0 % (ref 0.0–0.2)

## 2023-11-18 LAB — BASIC METABOLIC PANEL WITH GFR
Anion gap: 12 (ref 5–15)
BUN: 20 mg/dL (ref 8–23)
CO2: 24 mmol/L (ref 22–32)
Calcium: 9.2 mg/dL (ref 8.9–10.3)
Chloride: 99 mmol/L (ref 98–111)
Creatinine, Ser: 1.15 mg/dL — ABNORMAL HIGH (ref 0.44–1.00)
GFR, Estimated: 49 mL/min — ABNORMAL LOW (ref >60)
Glucose, Bld: 123 mg/dL — ABNORMAL HIGH (ref 70–99)
Potassium: 2.9 mmol/L — ABNORMAL LOW (ref 3.5–5.1)
Sodium: 135 mmol/L (ref 135–145)

## 2023-11-18 LAB — PROTIME-INR
INR: 1.9 — ABNORMAL HIGH (ref 0.8–1.2)
Prothrombin Time: 23.1 s — ABNORMAL HIGH (ref 11.4–15.2)

## 2023-11-18 MED ORDER — FENTANYL CITRATE (PF) 100 MCG/2ML IJ SOLN
50.0000 ug | INTRAMUSCULAR | Status: AC | PRN
  Administered 2023-11-18 – 2023-11-19 (×2): 50 ug via INTRAVENOUS
  Filled 2023-11-18 (×2): qty 2

## 2023-11-18 MED ORDER — POTASSIUM CHLORIDE CRYS ER 20 MEQ PO TBCR
40.0000 meq | EXTENDED_RELEASE_TABLET | Freq: Once | ORAL | Status: AC
  Administered 2023-11-19: 40 meq via ORAL
  Filled 2023-11-18: qty 2

## 2023-11-18 NOTE — ED Provider Notes (Signed)
 [facility] DEPARTMENT AT [facility]  Provider Note  CSN: [id number] Arrival date & time: 11/18/23 2213  History Chief Complaint  Patient presents with   Alexandra [name] is a 78 y.o. female with history of afib on warfarin reports she tripped over a rug in her kitchen earlier tonight falling and injuring her L hip. She thinks she hit her head but is unsure; no LOC. She is complaining of severe pain in L hip, worse with movement. EMS was called to the house and was noted to be borderline hypotensive enroute, so no pain medications given. She had IVF with EMS and BP normal on arrival.    Home Medications Prior to Admission medications   Medication Sig Start Date End Date Taking? Authorizing Provider  albuterol  (VENTOLIN  HFA) 108 (90 Base) MCG/ACT inhaler Inhale 1-2 puffs into the lungs every 6 (six) hours as needed for wheezing or shortness of breath. 04/05/21   Christopher Savannah, PA-C  ALPRAZolam  (XANAX ) 0.5 MG tablet Take 0.5 mg by mouth 3 (three) times daily as needed. 02/09/22   [provider]  atorvastatin  (LIPITOR) 20 MG tablet Take 20 mg by mouth daily.    [provider]  diltiazem  (CARDIZEM ) 30 MG tablet Take 1 tablet (30 mg total) by mouth 4 (four) times daily as needed. 10/04/22   Mallipeddi, Vishnu P, MD  escitalopram  (LEXAPRO ) 20 MG tablet Take 20 mg by mouth daily. 12/22/21   [provider]  famotidine  (PEPCID ) 20 MG tablet One after supper 05/25/23   Wert, Michael B, MD  Ferrous Sulfate (FEROSUL PO) Take by mouth daily.     [provider]  folic acid  (FOLVITE ) 1 MG tablet Take by mouth daily.    [provider]  lamoTRIgine  (LAMICTAL ) 150 MG tablet Take 150 mg by mouth daily.    [provider]  losartan  (COZAAR ) 25 MG tablet Take 1 tablet (25 mg total) by mouth as directed. hold losartan  for 2 weeks, if your top blood pressure number is less than 140, stop losartan . If your top blood pressure number  is greater than 140 mg, restart losartan  at 25 mg daily 03/30/22   Mallipeddi, Vishnu P, MD  methotrexate (50 MG/ML) 1 g injection Inject 20 mg into the vein once a week. SQ    [provider]  metoprolol  tartrate (LOPRESSOR ) 50 MG tablet TAKE 1 TABLET TWICE DAILY 09/28/23   Mallipeddi, Vishnu P, MD  nitroGLYCERIN  (NITROSTAT ) 0.4 MG SL tablet Place 1 tablet (0.4 mg total) under the tongue every 5 (five) minutes x 3 doses as needed for chest pain (If no relief after 3rd dose, call 911 or go to ED.). 03/24/22 10/04/22  Mallipeddi, Vishnu P, MD  pantoprazole  (PROTONIX ) 40 MG tablet Take 1 tablet (40 mg total) by mouth daily. Take 30-60 min before first meal of the day 05/25/23   Darlean Ozell NOVAK, MD  warfarin (COUMADIN ) 6 MG tablet TAKE 1 TABLET TO 1 AND 1/2 TABLETS EVERY DAY OR AS DIRECTED BY COUMADIN  CLINIC 09/12/23   Mallipeddi, Vishnu P, MD     Allergies    Codeine and Naproxen   Review of Systems   Review of Systems Please see HPI for pertinent positives and negatives  Physical Exam BP (!) 145/73   Pulse 77   Temp 98.4 F (36.9 C)   Resp 10   Ht 5' 1 (1.549 m)   Wt 78 kg   SpO2 97%   BMI  32.49 kg/m   Physical Exam Vitals and nursing note reviewed.  Constitutional:      Appearance: Normal appearance.  HENT:     Head: Normocephalic and atraumatic.     Nose: Nose normal.     Mouth/Throat:     Mouth: Mucous membranes are moist.  Eyes:     Extraocular Movements: Extraocular movements intact.     Conjunctiva/sclera: Conjunctivae normal.  Cardiovascular:     Rate and Rhythm: Normal rate.     Pulses: Normal pulses.  Pulmonary:     Effort: Pulmonary effort is normal.     Breath sounds: Normal breath sounds.  Abdominal:     General: Abdomen is flat.     Palpations: Abdomen is soft.     Tenderness: There is no abdominal tenderness.  Musculoskeletal:        General: Tenderness (L lateral hip) and deformity (L leg is shortened and rotated) present. No swelling.      Cervical back: Neck supple. No tenderness.  Skin:    General: Skin is warm and dry.  Neurological:     General: No focal deficit present.     Mental Status: She is alert.  Psychiatric:        Mood and Affect: Mood normal.     ED Results / Procedures / Treatments   EKG None  Procedures Procedures  Medications Ordered in the ED Medications  fentaNYL  (SUBLIMAZE ) injection 50 mcg (50 mcg Intravenous Given 11/19/23 0003)  potassium chloride  SA (KLOR-CON  M) CR tablet 40 mEq (40 mEq Oral Given 11/19/23 0003)    Initial Impression and Plan  Patient here for mechanical fall and L hip pain. Exam is concerning for hip fracture. Will send for imaging. Pain medication ordered. Preop labs, EKG and CXR ordered.   ED Course   Clinical Course as of 11/19/23 0007  Fri Nov 18, 2023  2315 I personally viewed the images from radiology studies and agree with radiologist interpretation: CT head and C spine are neg. Xray of hip confirms suspected fracture. Will discuss with Ortho, she is a patient of Dr. Graves, [facility] [CS]  [phone] Spoke with Dr. Dalldorf, Ortho, who requests the patient be admitted to [facility] and they will consult in the AM.  [CS]  2349 CBC is unremarkable. INR is mildly subtherapeutic.  [CS]  2351 BMP with hypokalemia, will replete orally. Hospitalist paged for admission.  [CS]  Sat Nov 19, 2023  0005 Spoke with Dr. Dena, Hospitalist, who will evaluate for admission.  [CS]    Clinical Course User Index [CS] Roselyn Carlin NOVAK, MD     MDM Rules/Calculators/A&P Medical Decision Making Problems Addressed: Closed left hip fracture, initial encounter [facility]): acute illness or injury Fall, initial encounter: acute illness or injury  Amount and/or Complexity of Data Reviewed Labs: ordered. Decision-making details documented in ED Course. Radiology: ordered and independent interpretation performed. Decision-making details documented in ED Course. ECG/medicine tests: ordered and  independent interpretation performed. Decision-making details documented in ED Course.  Risk Prescription drug management. Parenteral controlled substances. Decision regarding hospitalization.     Final Clinical Impression(s) / ED Diagnoses Final diagnoses:  Closed left hip fracture, initial encounter [facility])  Fall, initial encounter    Rx / DC Orders ED Discharge Orders     None        Roselyn Carlin NOVAK, MD 11/19/23 [phone]

## 2023-11-18 NOTE — ED Triage Notes (Addendum)
 [facility] from home.  Cc of fall and landed on left side.  C/o left hip pain.  Said she stumbled over a rug in the kitchen Hit head but no LOC. Is on blood thinners Took off C Collar off when she check in with EMS.  Left leg is reportedly always shorter per EMS  20g R AC placed   98/50 with ems so she did not get any pain medications.

## 2023-11-19 DIAGNOSIS — S72002A Fracture of unspecified part of neck of left femur, initial encounter for closed fracture: Secondary | ICD-10-CM

## 2023-11-19 DIAGNOSIS — R531 Weakness: Secondary | ICD-10-CM | POA: Diagnosis not present

## 2023-11-19 DIAGNOSIS — E7800 Pure hypercholesterolemia, unspecified: Secondary | ICD-10-CM | POA: Diagnosis present

## 2023-11-19 DIAGNOSIS — R110 Nausea: Secondary | ICD-10-CM | POA: Diagnosis present

## 2023-11-19 DIAGNOSIS — Z7401 Bed confinement status: Secondary | ICD-10-CM | POA: Diagnosis not present

## 2023-11-19 DIAGNOSIS — I480 Paroxysmal atrial fibrillation: Secondary | ICD-10-CM | POA: Diagnosis present

## 2023-11-19 DIAGNOSIS — F32A Depression, unspecified: Secondary | ICD-10-CM | POA: Diagnosis present

## 2023-11-19 DIAGNOSIS — S72009A Fracture of unspecified part of neck of unspecified femur, initial encounter for closed fracture: Secondary | ICD-10-CM | POA: Diagnosis present

## 2023-11-19 DIAGNOSIS — W19XXXA Unspecified fall, initial encounter: Secondary | ICD-10-CM | POA: Diagnosis not present

## 2023-11-19 DIAGNOSIS — I10 Essential (primary) hypertension: Secondary | ICD-10-CM | POA: Diagnosis present

## 2023-11-19 DIAGNOSIS — Y92000 Kitchen of unspecified non-institutional (private) residence as  the place of occurrence of the external cause: Secondary | ICD-10-CM | POA: Diagnosis not present

## 2023-11-19 DIAGNOSIS — Z96659 Presence of unspecified artificial knee joint: Secondary | ICD-10-CM | POA: Diagnosis present

## 2023-11-19 DIAGNOSIS — G4733 Obstructive sleep apnea (adult) (pediatric): Secondary | ICD-10-CM | POA: Diagnosis not present

## 2023-11-19 DIAGNOSIS — R509 Fever, unspecified: Secondary | ICD-10-CM | POA: Diagnosis present

## 2023-11-19 DIAGNOSIS — S0990XA Unspecified injury of head, initial encounter: Secondary | ICD-10-CM | POA: Diagnosis present

## 2023-11-19 DIAGNOSIS — Z885 Allergy status to narcotic agent status: Secondary | ICD-10-CM | POA: Diagnosis not present

## 2023-11-19 DIAGNOSIS — S72142A Displaced intertrochanteric fracture of left femur, initial encounter for closed fracture: Secondary | ICD-10-CM | POA: Diagnosis present

## 2023-11-19 DIAGNOSIS — F419 Anxiety disorder, unspecified: Secondary | ICD-10-CM | POA: Diagnosis present

## 2023-11-19 DIAGNOSIS — I4891 Unspecified atrial fibrillation: Secondary | ICD-10-CM | POA: Diagnosis not present

## 2023-11-19 DIAGNOSIS — M069 Rheumatoid arthritis, unspecified: Secondary | ICD-10-CM | POA: Diagnosis present

## 2023-11-19 DIAGNOSIS — E876 Hypokalemia: Secondary | ICD-10-CM | POA: Diagnosis present

## 2023-11-19 DIAGNOSIS — Z79899 Other long term (current) drug therapy: Secondary | ICD-10-CM | POA: Diagnosis not present

## 2023-11-19 DIAGNOSIS — E871 Hypo-osmolality and hyponatremia: Secondary | ICD-10-CM | POA: Diagnosis present

## 2023-11-19 DIAGNOSIS — W1809XA Striking against other object with subsequent fall, initial encounter: Secondary | ICD-10-CM | POA: Diagnosis present

## 2023-11-19 DIAGNOSIS — Z886 Allergy status to analgesic agent status: Secondary | ICD-10-CM | POA: Diagnosis not present

## 2023-11-19 DIAGNOSIS — Z823 Family history of stroke: Secondary | ICD-10-CM | POA: Diagnosis not present

## 2023-11-19 DIAGNOSIS — Z8249 Family history of ischemic heart disease and other diseases of the circulatory system: Secondary | ICD-10-CM | POA: Diagnosis not present

## 2023-11-19 DIAGNOSIS — Z7901 Long term (current) use of anticoagulants: Secondary | ICD-10-CM | POA: Diagnosis not present

## 2023-11-19 HISTORY — DX: Fracture of unspecified part of neck of unspecified femur, initial encounter for closed fracture: S72.009A

## 2023-11-19 LAB — SURGICAL PCR SCREEN
MRSA, PCR: NEGATIVE
Staphylococcus aureus: NEGATIVE

## 2023-11-19 LAB — BASIC METABOLIC PANEL WITH GFR
Anion gap: 7 (ref 5–15)
BUN: 17 mg/dL (ref 8–23)
CO2: 24 mmol/L (ref 22–32)
Calcium: 8.5 mg/dL — ABNORMAL LOW (ref 8.9–10.3)
Chloride: 103 mmol/L (ref 98–111)
Creatinine, Ser: 0.82 mg/dL (ref 0.44–1.00)
GFR, Estimated: >60 mL/min (ref >60)
Glucose, Bld: 96 mg/dL (ref 70–99)
Potassium: 3.5 mmol/L (ref 3.5–5.1)
Sodium: 134 mmol/L — ABNORMAL LOW (ref 135–145)

## 2023-11-19 LAB — MAGNESIUM: Magnesium: 1.4 mg/dL — ABNORMAL LOW (ref 1.7–2.4)

## 2023-11-19 LAB — PROTIME-INR
INR: 1.5 — ABNORMAL HIGH (ref 0.8–1.2)
INR: 1.4 — ABNORMAL HIGH (ref 0.8–1.2)
Prothrombin Time: 18.9 s — ABNORMAL HIGH (ref 11.4–15.2)
Prothrombin Time: 18.1 s — ABNORMAL HIGH (ref 11.4–15.2)

## 2023-11-19 LAB — TYPE AND SCREEN
ABO/RH(D): O POS
ABO/RH(D): O POS
Antibody Screen: NEGATIVE
Antibody Screen: NEGATIVE

## 2023-11-19 MED ORDER — OXYCODONE HCL 5 MG PO TABS
5.0000 mg | ORAL_TABLET | ORAL | Status: DC | PRN
  Administered 2023-11-19 (×3): 5 mg via ORAL
  Filled 2023-11-19 (×4): qty 1

## 2023-11-19 MED ORDER — ENSURE PRE-SURGERY PO LIQD
296.0000 mL | Freq: Once | ORAL | Status: AC
  Administered 2023-11-20: 296 mL via ORAL
  Filled 2023-11-19: qty 296

## 2023-11-19 MED ORDER — ALPRAZOLAM 0.5 MG PO TABS: 0.5000 mg | ORAL_TABLET | Freq: Three times a day (TID) | ORAL | Status: DC | PRN

## 2023-11-19 MED ORDER — ATORVASTATIN CALCIUM 20 MG PO TABS
20.0000 mg | ORAL_TABLET | Freq: Every day | ORAL | Status: DC
  Administered 2023-11-19: 20 mg via ORAL
  Filled 2023-11-19: qty 2

## 2023-11-19 MED ORDER — METOPROLOL TARTRATE 50 MG PO TABS
50.0000 mg | ORAL_TABLET | Freq: Two times a day (BID) | ORAL | Status: DC
  Administered 2023-11-19 – 2023-11-23 (×8): 50 mg via ORAL
  Filled 2023-11-19: qty 1
  Filled 2023-11-19: qty 2
  Filled 2023-11-19 (×7): qty 1

## 2023-11-19 MED ORDER — ACETAMINOPHEN 650 MG RE SUPP: 650.0000 mg | Freq: Four times a day (QID) | RECTAL | Status: DC | PRN

## 2023-11-19 MED ORDER — ONDANSETRON HCL 4 MG/2ML IJ SOLN
4.0000 mg | Freq: Four times a day (QID) | INTRAMUSCULAR | Status: DC | PRN
Start: 2023-11-19 — End: 2023-11-20

## 2023-11-19 MED ORDER — MAGNESIUM SULFATE 4 GM/100ML IV SOLN
4.0000 g | Freq: Once | INTRAVENOUS | Status: AC
  Administered 2023-11-19: 4 g via INTRAVENOUS
  Filled 2023-11-19: qty 100

## 2023-11-19 MED ORDER — VITAMIN K1 10 MG/ML IJ SOLN
INTRAMUSCULAR | Status: AC
Start: 2023-11-19 — End: 2023-11-19
  Filled 2023-11-19: qty 1

## 2023-11-19 MED ORDER — FENTANYL CITRATE (PF) 100 MCG/2ML IJ SOLN: 50.0000 ug | Freq: Once | INTRAMUSCULAR | Status: DC

## 2023-11-19 MED ORDER — ESCITALOPRAM OXALATE 20 MG PO TABS
20.0000 mg | ORAL_TABLET | Freq: Every day | ORAL | Status: DC
  Administered 2023-11-19 – 2023-11-23 (×4): 20 mg via ORAL
  Filled 2023-11-19 (×3): qty 1
  Filled 2023-11-19: qty 2
  Filled 2023-11-19: qty 1

## 2023-11-19 MED ORDER — POTASSIUM CHLORIDE 10 MEQ/100ML IV SOLN
10.0000 meq | INTRAVENOUS | Status: AC
  Administered 2023-11-19 (×4): 10 meq via INTRAVENOUS
  Filled 2023-11-19 (×4): qty 100

## 2023-11-19 MED ORDER — PANTOPRAZOLE SODIUM 40 MG PO TBEC
40.0000 mg | DELAYED_RELEASE_TABLET | Freq: Every day | ORAL | Status: DC
Start: 2023-11-19 — End: 2023-11-20
  Administered 2023-11-19: 40 mg via ORAL
  Filled 2023-11-19: qty 1

## 2023-11-19 MED ORDER — FENTANYL CITRATE PF 50 MCG/ML IJ SOSY
25.0000 ug | PREFILLED_SYRINGE | INTRAMUSCULAR | Status: DC | PRN
  Administered 2023-11-19: 25 ug via INTRAVENOUS
  Filled 2023-11-19: qty 1

## 2023-11-19 MED ORDER — SIMETHICONE 40 MG/0.6ML PO SUSP
40.0000 mg | Freq: Four times a day (QID) | ORAL | Status: DC | PRN
  Administered 2023-11-19: 40 mg via ORAL
  Filled 2023-11-19 (×2): qty 0.6

## 2023-11-19 MED ORDER — FENTANYL CITRATE (PF) 100 MCG/2ML IJ SOLN
25.0000 ug | Freq: Once | INTRAMUSCULAR | Status: AC
  Administered 2023-11-19: 25 ug via INTRAVENOUS
  Filled 2023-11-19: qty 2

## 2023-11-19 MED ORDER — ONDANSETRON HCL 4 MG PO TABS: 4.0000 mg | ORAL_TABLET | Freq: Four times a day (QID) | ORAL | Status: DC | PRN

## 2023-11-19 MED ORDER — FENTANYL CITRATE (PF) 100 MCG/2ML IJ SOLN
25.0000 ug | INTRAMUSCULAR | Status: DC | PRN
  Administered 2023-11-19 (×3): 25 ug via INTRAVENOUS
  Filled 2023-11-19 (×3): qty 2

## 2023-11-19 MED ORDER — VITAMIN K1 10 MG/ML IJ SOLN
5.0000 mg | Freq: Once | INTRAVENOUS | Status: AC
  Administered 2023-11-19: 5 mg via INTRAVENOUS
  Filled 2023-11-19: qty 0.5

## 2023-11-19 MED ORDER — ACETAMINOPHEN 325 MG PO TABS
650.0000 mg | ORAL_TABLET | Freq: Four times a day (QID) | ORAL | Status: DC | PRN
Start: 2023-11-19 — End: 2023-11-20
  Administered 2023-11-19 – 2023-11-20 (×2): 650 mg via ORAL
  Filled 2023-11-19 (×2): qty 2

## 2023-11-19 MED ORDER — POTASSIUM CHLORIDE CRYS ER 20 MEQ PO TBCR
40.0000 meq | EXTENDED_RELEASE_TABLET | Freq: Once | ORAL | Status: AC
  Administered 2023-11-19: 40 meq via ORAL
  Filled 2023-11-19: qty 2

## 2023-11-19 MED ORDER — LAMOTRIGINE 25 MG PO TABS
150.0000 mg | ORAL_TABLET | Freq: Every day | ORAL | Status: DC
  Administered 2023-11-19 – 2023-11-23 (×4): 150 mg via ORAL
  Filled 2023-11-19 (×5): qty 2

## 2023-11-19 NOTE — ED Notes (Signed)
 Family arrived and were told pt is NPO and that means nothing to drink, not even water . They understood and agreed.

## 2023-11-19 NOTE — H&P (View-Only) (Signed)
 Reason for Consult:Left Hip Pain Referring Physician: [name], [name] is an 78 y.o. female.  HPI: Alexandra [name] is a 78 y.o. female with history of afib on warfarin reports she tripped over a rug in her kitchen earlier tonight falling and injuring her L hip. She thinks she hit her head but is unsure; no LOC. She is complaining of severe pain in L hip, worse with movement. EMS was called to the house and was noted to be borderline hypotensive enroute, so no pain medications given. She had IVF with EMS and BP normal on arrival. Xrays demonstrated a left inter trochanteric fracture.  She was transferred to [facility] for surgical management.  She is a long standing patient of [facility] and Dr. Dalldorf was consulted for surgery.  Past Medical History:  Diagnosis Date   Depression    Gout    High cholesterol    Hypertension     Past Surgical History:  Procedure Laterality Date   CARPAL TUNNEL RELEASE     CHOLECYSTECTOMY     REPLACEMENT TOTAL KNEE      Family History  Problem Relation Age of Onset   Heart attack Mother    Pulmonary embolism Father    Cancer Sister    Coronary artery disease Brother    Stroke Maternal Grandmother    Cancer Paternal Grandfather     Social History:  reports that she has never smoked. She has never used smokeless tobacco. She reports that she does not currently use alcohol. She reports that she does not use drugs.  Allergies:  Allergies  Allergen Reactions   Codeine Nausea And Vomiting and Nausea Only   Naproxen Itching    Medications: I have reviewed the patient's current medications.  Results for orders placed or performed during the hospital encounter of 11/18/23 (from the past 48 hours)  Basic metabolic panel     Status: Abnormal   Collection Time: 11/18/23 11:25 PM  Result Value Ref Range   Sodium 135 135 - 145 mmol/L   Potassium 2.9 (L) 3.5 - 5.1 mmol/L   Chloride 99 98 - 111 mmol/L   CO2 24 22 - 32  mmol/L   Glucose, Bld 123 (H) 70 - 99 mg/dL    Comment: Glucose reference range applies only to samples taken after fasting for at least 8 hours.   BUN 20 8 - 23 mg/dL   Creatinine, Ser 8.84 (H) 0.44 - 1.00 mg/dL   Calcium  9.2 8.9 - 10.3 mg/dL   GFR, Estimated 49 (L) >60 mL/min    Comment: (NOTE) Calculated using the CKD-EPI Creatinine Equation (2021)    Anion gap 12 5 - 15    Comment: Performed at [facility], [address] [place], [place] [address]  CBC with Differential     Status: Abnormal   Collection Time: 11/18/23 11:25 PM  Result Value Ref Range   WBC 9.7 4.0 - 10.5 K/uL   RBC 3.64 (L) 3.87 - 5.11 MIL/uL   Hemoglobin 11.5 (L) 12.0 - 15.0 g/dL   HCT 66.9 (L) 63.9 - 53.9 %   MCV 90.7 80.0 - 100.0 fL   MCH 31.6 26.0 - 34.0 pg   MCHC 34.8 30.0 - 36.0 g/dL   RDW 86.3 88.4 - 84.4 %   Platelets 245 150 - 400 K/uL   nRBC 0.0 0.0 - 0.2 %   Neutrophils Relative % 73 %   Neutro Abs 7.2 1.7 - 7.7 K/uL  Lymphocytes Relative 16 %   Lymphs Abs 1.5 0.7 - 4.0 K/uL   Monocytes Relative 7 %   Monocytes Absolute 0.7 0.1 - 1.0 K/uL   Eosinophils Relative 2 %   Eosinophils Absolute 0.2 0.0 - 0.5 K/uL   Basophils Relative 1 %   Basophils Absolute 0.1 0.0 - 0.1 K/uL   Immature Granulocytes 1 %   Abs Immature Granulocytes 0.05 0.00 - 0.07 K/uL    Comment: Performed at [facility], [address], [place], [place] [address]  Protime-INR     Status: Abnormal   Collection Time: 11/18/23 11:25 PM  Result Value Ref Range   Prothrombin Time 23.1 (H) 11.4 - 15.2 seconds   INR 1.9 (H) 0.8 - 1.2    Comment: (NOTE) INR goal varies based on device and disease states. Performed at [facility], [address] [place], [place] [address]   Type and screen [facility]     Status: None   Collection Time: 11/18/23 11:25 PM  Result Value Ref Range   ABO/RH(D) O POS    Antibody Screen NEG    Sample Expiration      11/21/2023,2359 Performed at [facility], [address]  [place], [place] [address]   Protime-INR     Status: Abnormal   Collection Time: 11/19/23 12:16 PM  Result Value Ref Range   Prothrombin Time 18.9 (H) 11.4 - 15.2 seconds   INR 1.5 (H) 0.8 - 1.2    Comment: (NOTE) INR goal varies based on device and disease states. Performed at [facility], [address], [place], [place] [address]   Basic metabolic panel with GFR     Status: Abnormal   Collection Time: 11/19/23 12:16 PM  Result Value Ref Range   Sodium 134 (L) 135 - 145 mmol/L   Potassium 3.5 3.5 - 5.1 mmol/L   Chloride 103 98 - 111 mmol/L   CO2 24 22 - 32 mmol/L   Glucose, Bld 96 70 - 99 mg/dL    Comment: Glucose reference range applies only to samples taken after fasting for at least 8 hours.   BUN 17 8 - 23 mg/dL   Creatinine, Ser 9.17 0.44 - 1.00 mg/dL   Calcium  8.5 (L) 8.9 - 10.3 mg/dL   GFR, Estimated >39 >39 mL/min    Comment: (NOTE) Calculated using the CKD-EPI Creatinine Equation (2021)    Anion gap 7 5 - 15    Comment: Performed at [facility], [address] [place], [place] [address]  Magnesium      Status: Abnormal   Collection Time: 11/19/23 12:16 PM  Result Value Ref Range   Magnesium  1.4 (L) 1.7 - 2.4 mg/dL    Comment: Performed at [facility], [address], [place], [place] [address]    DG Chest Port 1 View Result Date: 11/18/2023 CLINICAL DATA:  Preoperative EXAM: PORTABLE CHEST 1 VIEW COMPARISON:  Chest x-ray 05/25/2023 FINDINGS: The heart size and mediastinal contours are within normal limits. Both lungs are clear. The visualized skeletal structures are unremarkable. IMPRESSION: No active disease. Electronically Signed   By: Greig Pique M.D.   On: 11/18/2023 23:21   DG Hip Unilat With Pelvis 2-3 Views Left Result Date: 11/18/2023 CLINICAL DATA:  Fall, left hip pain EXAM: DG HIP (WITH OR WITHOUT PELVIS) 2-3V LEFT COMPARISON:  None Available. FINDINGS: There is a left femoral intertrochanteric  fracture with varus angulation and mild displacement. No subluxation or dislocation. IMPRESSION: Left femoral  intertrochanteric fracture with varus angulation. Electronically Signed   By: Franky Crease M.D.   On: 11/18/2023 23:02   CT Head Wo Contrast Result Date: 11/18/2023 CLINICAL DATA:  Head trauma, minor (Age >= 65y); Neck trauma (Age >= 65y). Fall, head injury EXAM: CT HEAD WITHOUT CONTRAST CT CERVICAL SPINE WITHOUT CONTRAST TECHNIQUE: Multidetector CT imaging of the head and cervical spine was performed following the standard protocol without intravenous contrast. Multiplanar CT image reconstructions of the cervical spine were also generated. RADIATION DOSE REDUCTION: This exam was performed according to the departmental dose-optimization program which includes automated exposure control, adjustment of the mA and/or kV according to patient size and/or use of iterative reconstruction technique. COMPARISON:  None Available. FINDINGS: CT HEAD FINDINGS Brain: Normal anatomic configuration. Parenchymal volume loss is commensurate with the patient's age. Mild periventricular white matter changes are present likely reflecting the sequela of small vessel ischemia. No abnormal intra or extra-axial mass lesion or fluid collection. No abnormal mass effect or midline shift. No evidence of acute intracranial hemorrhage or infarct. Ventricular size is normal. Cerebellum unremarkable. Vascular: No asymmetric hyperdense vasculature at the skull base. Skull: Intact Sinuses/Orbits: Paranasal sinuses are clear. Orbits are unremarkable. Other: Mastoid air cells and middle ear cavities are clear. CT CERVICAL SPINE FINDINGS Alignment: Normal. Skull base and vertebrae: No acute fracture. No primary bone lesion or focal pathologic process. Soft tissues and spinal canal: No prevertebral fluid or swelling. No visible canal hematoma. Disc levels: Intervertebral disc space narrowing and endplate remodeling is seen throughout the  cervical spine in keeping with changes of diffuse comp moderate degenerative disc disease, most severe at C4-5. Prevertebral soft tissues are not thickened on sagittal reformats. Multilevel uncovertebral and facet arthrosis results in moderate to severe bilateral neuroforaminal narrowing at C3-4 and C4-5 and more moderate neuroforaminal narrowing on the left at C5-6. No high-grade canal stenosis. Upper chest: Negative. Other: 2.6 cm hypodense right thyroid  nodule noted, not well characterized on this examination. IMPRESSION: 1. No acute intracranial abnormality. No skull fracture. 2. No acute fracture or listhesis of the cervical spine. 3. 2.6 cm right thyroid  nodule, not well characterized on this examination. Recommend thyroid  US  (ref: J Am Coll Radiol. 2015 Feb;12(2): 143-50). 4. 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 Electronically Signed   By: Dorethia Molt M.D.   On: 11/18/2023 22:56   CT Cervical Spine Wo Contrast Result Date: 11/18/2023 CLINICAL DATA:  Head trauma, minor (Age >= 65y); Neck trauma (Age >= 65y). Fall, head injury EXAM: CT HEAD WITHOUT CONTRAST CT CERVICAL SPINE WITHOUT CONTRAST TECHNIQUE: Multidetector CT imaging of the head and cervical spine was performed following the standard protocol without intravenous contrast. Multiplanar CT image reconstructions of the cervical spine were also generated. RADIATION DOSE REDUCTION: This exam was performed according to the departmental dose-optimization program which includes automated exposure control, adjustment of the mA and/or kV according to patient size and/or use of iterative reconstruction technique. COMPARISON:  None Available. FINDINGS: CT HEAD FINDINGS Brain: Normal anatomic configuration. Parenchymal volume loss is commensurate with the patient's age. Mild  periventricular white matter changes are present likely reflecting the sequela of small vessel ischemia. No abnormal intra or extra-axial mass lesion or fluid collection. No abnormal mass effect or midline shift. No evidence of acute intracranial hemorrhage or infarct. Ventricular size is normal. Cerebellum unremarkable. Vascular: No asymmetric hyperdense vasculature  at the skull base. Skull: Intact Sinuses/Orbits: Paranasal sinuses are clear. Orbits are unremarkable. Other: Mastoid air cells and middle ear cavities are clear. CT CERVICAL SPINE FINDINGS Alignment: Normal. Skull base and vertebrae: No acute fracture. No primary bone lesion or focal pathologic process. Soft tissues and spinal canal: No prevertebral fluid or swelling. No visible canal hematoma. Disc levels: Intervertebral disc space narrowing and endplate remodeling is seen throughout the cervical spine in keeping with changes of diffuse comp moderate degenerative disc disease, most severe at C4-5. Prevertebral soft tissues are not thickened on sagittal reformats. Multilevel uncovertebral and facet arthrosis results in moderate to severe bilateral neuroforaminal narrowing at C3-4 and C4-5 and more moderate neuroforaminal narrowing on the left at C5-6. No high-grade canal stenosis. Upper chest: Negative. Other: 2.6 cm hypodense right thyroid  nodule noted, not well characterized on this examination. IMPRESSION: 1. No acute intracranial abnormality. No skull fracture. 2. No acute fracture or listhesis of the cervical spine. 3. 2.6 cm right thyroid  nodule, not well characterized on this examination. Recommend thyroid  US  (ref: J Am Coll Radiol. 2015 Feb;12(2): 143-50). 4. 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 Electronically Signed   By: Dorethia Molt M.D.   On: 11/18/2023 22:56     Review of Systems  Constitutional: Negative.   HENT: Negative.    Eyes: Negative.   Respiratory: Negative.         Sleep apnea  Cardiovascular:        Afib on coumadin   Gastrointestinal: Negative.   Endocrine: Negative.   Genitourinary: Negative.   Musculoskeletal:  Positive for arthralgias and myalgias.  Allergic/Immunologic: Negative.   Neurological: Negative.   Hematological:  Bruises/bleeds easily.  Psychiatric/Behavioral: Negative.     Blood pressure (!) 149/85, pulse 83, temperature 98.5 F (36.9 C), temperature source Oral, resp. rate 16, height 5' 1 (1.549 m), weight 78 kg, SpO2 98%. Physical Exam Constitutional:      Appearance: Normal appearance. She is obese.  HENT:     Head: Normocephalic and atraumatic.     Nose: Nose normal.  Eyes:     Pupils: Pupils are equal, round, and reactive to light.  Cardiovascular:     Pulses: Normal pulses.  Pulmonary:     Effort: Pulmonary effort is normal.  Musculoskeletal:        General: Tenderness and deformity present.     Cervical back: Normal range of motion.     Comments: Pt has obvious pain with palpation of left hip/thigh.  Sensation and pulses intact distally  Skin:    General: Skin is warm and dry.  Neurological:     General: No focal deficit present.     Mental Status: She is alert and oriented to person, place, and time. Mental status is at baseline.  Psychiatric:        Mood and Affect: Mood normal.        Behavior: Behavior normal.        Thought Content: Thought content normal.        Judgment: Judgment normal.     Assessment/Plan: Left Hip Intertrochanteric fracture  This patient was discussed with Dr. Dalldorf and she will need ORIF with nail/rod placement.  We plan to proceed with sx when medically safe and clearance from hospitalist service.  Most likely tomorrow.  Looks like pt has pain meds on board.  NPO.  Bed rest.  Camellia Ellen 11/19/2023, 2:08 PM

## 2023-11-19 NOTE — H&P (Signed)
 History and Physical    Alexandra [name] FMW:[id number] DOB: 04/07/1946 DOA: 11/18/2023  PCP: Bertell Satterfield, MD   Chief Complaint: Mechanical fall  HPI: Alexandra [name] is a 78 y.o. female with medical history significant of A-fib on warfarin, depression, gout, hypertension, hyperlipidemia who presented after tripping on a rug at home landing on her left side.  She developed severe pain.  She went to the emergency department where she was found to be febrile hemodynamically stable.  Labs were obtained which demonstrated sodium 2.9, creatinine 1.15.  INR 1.9.  Patient underwent chest x-ray which showed no acute findings.  X-ray of hip showed left femoral neck fracture.  CT spine showed no fracture.  CT head showed no acute abnormalities.  There was a thyroid  nodule recommended outpatient follow-up.  Orthopedics was still recommend transfer to [facility].  Patient was admitted for further workup.  She was given IV vitamin K  to reverse her therapeutic INR.  Review of Systems: Review of Systems  Constitutional:  Negative for chills and fever.  HENT: Negative.    Eyes: Negative.   Respiratory: Negative.  Negative for cough.   Cardiovascular: Negative.   Gastrointestinal: Negative.   Genitourinary: Negative.   Musculoskeletal: Negative.   Skin: Negative.   Neurological: Negative.   Endo/Heme/Allergies: Negative.   Psychiatric/Behavioral: Negative.    All other systems reviewed and are negative.    As per HPI otherwise 10 point review of systems negative.   Allergies  Allergen Reactions   Codeine Nausea And Vomiting and Nausea Only   Naproxen Itching    Past Medical History:  Diagnosis Date   Depression    Gout    High cholesterol    Hypertension     Past Surgical History:  Procedure Laterality Date   CARPAL TUNNEL RELEASE     CHOLECYSTECTOMY     REPLACEMENT TOTAL KNEE       reports that she has never smoked. She has never used smokeless tobacco. She reports that she does  not currently use alcohol. She reports that she does not use drugs.  Family History  Problem Relation Age of Onset   Heart attack Mother    Pulmonary embolism Father    Cancer Sister    Coronary artery disease Brother    Stroke Maternal Grandmother    Cancer Paternal Grandfather     Prior to Admission medications   Medication Sig Start Date End Date Taking? Authorizing Provider  albuterol  (VENTOLIN  HFA) 108 (90 Base) MCG/ACT inhaler Inhale 1-2 puffs into the lungs every 6 (six) hours as needed for wheezing or shortness of breath. 04/05/21   Christopher Savannah, PA-C  ALPRAZolam  (XANAX ) 0.5 MG tablet Take 0.5 mg by mouth 3 (three) times daily as needed. 02/09/22   [provider]  atorvastatin  (LIPITOR) 20 MG tablet Take 20 mg by mouth daily.    [provider]  diltiazem  (CARDIZEM ) 30 MG tablet Take 1 tablet (30 mg total) by mouth 4 (four) times daily as needed. 10/04/22   Mallipeddi, Vishnu P, MD  escitalopram  (LEXAPRO ) 20 MG tablet Take 20 mg by mouth daily. 12/22/21   [provider]  famotidine  (PEPCID ) 20 MG tablet One after supper 05/25/23   Wert, Michael B, MD  Ferrous Sulfate (FEROSUL PO) Take by mouth daily.     [provider]  folic acid  (FOLVITE ) 1 MG tablet Take by mouth daily.    [provider]  lamoTRIgine  (LAMICTAL ) 150 MG tablet Take 150 mg by mouth  daily.    [provider]  losartan  (COZAAR ) 25 MG tablet Take 1 tablet (25 mg total) by mouth as directed. hold losartan  for 2 weeks, if your top blood pressure number is less than 140, stop losartan . If your top blood pressure number is greater than 140 mg, restart losartan  at 25 mg daily 03/30/22   Mallipeddi, Vishnu P, MD  methotrexate (50 MG/ML) 1 g injection Inject 20 mg into the vein once a week. SQ    [provider]  metoprolol  tartrate (LOPRESSOR ) 50 MG tablet TAKE 1 TABLET TWICE DAILY 09/28/23   Mallipeddi, Vishnu P, MD  nitroGLYCERIN  (NITROSTAT ) 0.4 MG SL tablet Place  1 tablet (0.4 mg total) under the tongue every 5 (five) minutes x 3 doses as needed for chest pain (If no relief after 3rd dose, call 911 or go to ED.). 03/24/22 10/04/22  Mallipeddi, Vishnu P, MD  pantoprazole  (PROTONIX ) 40 MG tablet Take 1 tablet (40 mg total) by mouth daily. Take 30-60 min before first meal of the day 05/25/23   Darlean Ozell NOVAK, MD  warfarin (COUMADIN ) 6 MG tablet TAKE 1 TABLET TO 1 AND 1/2 TABLETS EVERY DAY OR AS DIRECTED BY COUMADIN  CLINIC 09/12/23   Mallipeddi, Diannah SQUIBB, MD    Physical Exam: Vitals:   11/19/23 0000 11/19/23 0100 11/19/23 0130 11/19/23 0200  BP: 139/68 113/64 (!) 125/59 132/67  Pulse: 76 70 69 69  Resp: 16 14 17  (!) 21  Temp:      SpO2: 100% 99% 99% 98%  Weight:      Height:       Physical Exam Constitutional:      Appearance: She is normal weight.  HENT:     Head: Normocephalic.     Nose: Nose normal.     Mouth/Throat:     Mouth: Mucous membranes are moist.     Pharynx: Oropharynx is clear.  Eyes:     Conjunctiva/sclera: Conjunctivae normal.     Pupils: Pupils are equal, round, and reactive to light.  Cardiovascular:     Rate and Rhythm: Normal rate and regular rhythm.  Pulmonary:     Effort: Pulmonary effort is normal.     Breath sounds: Normal breath sounds.  Abdominal:     General: Abdomen is flat. Bowel sounds are normal.  Musculoskeletal:        General: Normal range of motion.     Cervical back: Normal range of motion.  Skin:    General: Skin is warm.     Capillary Refill: Capillary refill takes less than 2 seconds.  Neurological:     General: No focal deficit present.     Mental Status: She is alert. Mental status is at baseline.  Psychiatric:        Mood and Affect: Mood normal.       Labs on Admission: I have personally reviewed the patients's labs and imaging studies.  Assessment/Plan Principal Problem:   Hip fracture (HCC)   # Left hip fracture - Patient mechanical fall on rug - Angulated fracture with need for  surgical intervention  Plan: N.p.o. Reverse warfarin Transferred to [facility] for surgical management  # Paroxysmal A-fib-patient currently not in A-fib.  Will hold diltiazem  and hold warfarin.  Continue beta-blocker  # htn- hold losartan   # Hyperlipidemia-continue atorvastatin   # Depression-continue Lexapro   # Mood disorder-continue Lamictal     Admission status: Inpatient Med-Surg  Certification: The appropriate patient status for this patient is INPATIENT. Inpatient status is judged  to be reasonable and necessary in order to provide the required intensity of service to ensure the patient's safety. The patient's presenting symptoms, physical exam findings, and initial radiographic and laboratory data in the context of their chronic comorbidities is felt to place them at high risk for further clinical deterioration. Furthermore, it is not anticipated that the patient will be medically stable for discharge from the hospital within 2 midnights of admission.   * I certify that at the point of admission it is my clinical judgment that the patient will require inpatient hospital care spanning beyond 2 midnights from the point of admission due to high intensity of service, high risk for further deterioration and high frequency of surveillance required.DEWAINE Lamar Dess MD [facility] If 7PM-7AM, please contact night-coverage [email or web address]  11/19/2023, 2:39 AM

## 2023-11-19 NOTE — Consult Note (Signed)
 Reason for Consult:Left Hip Pain Referring Physician: [name], [name] is an 78 y.o. female.  HPI: Alexandra [name] is a 78 y.o. female with history of afib on warfarin reports she tripped over a rug in her kitchen earlier tonight falling and injuring her L hip. She thinks she hit her head but is unsure; no LOC. She is complaining of severe pain in L hip, worse with movement. EMS was called to the house and was noted to be borderline hypotensive enroute, so no pain medications given. She had IVF with EMS and BP normal on arrival. Xrays demonstrated a left inter trochanteric fracture.  She was transferred to [facility] for surgical management.  She is a long standing patient of Alexandra [name] and Alexandra [name] was consulted for surgery.  Past Medical History:  Diagnosis Date   Depression    Gout    High cholesterol    Hypertension     Past Surgical History:  Procedure Laterality Date   CARPAL TUNNEL RELEASE     CHOLECYSTECTOMY     REPLACEMENT TOTAL KNEE      Family History  Problem Relation Age of Onset   Heart attack Mother    Pulmonary embolism Father    Cancer Sister    Coronary artery disease Brother    Stroke Maternal Grandmother    Cancer Paternal Grandfather     Social History:  reports that she has never smoked. She has never used smokeless tobacco. She reports that she does not currently use alcohol. She reports that she does not use drugs.  Allergies:  Allergies  Allergen Reactions   Codeine Nausea And Vomiting and Nausea Only   Naproxen Itching    Medications: I have reviewed the patient's current medications.  Results for orders placed or performed during the hospital encounter of 11/18/23 (from the past 48 hours)  Basic metabolic panel     Status: Abnormal   Collection Time: 11/18/23 11:25 PM  Result Value Ref Range   Sodium 135 135 - 145 mmol/L   Potassium 2.9 (L) 3.5 - 5.1 mmol/L   Chloride 99 98 - 111 mmol/L   CO2 24 22 - 32  mmol/L   Glucose, Bld 123 (H) 70 - 99 mg/dL    Comment: Glucose reference range applies only to samples taken after fasting for at least 8 hours.   BUN 20 8 - 23 mg/dL   Creatinine, Ser 8.84 (H) 0.44 - 1.00 mg/dL   Calcium  9.2 8.9 - 10.3 mg/dL   GFR, Estimated 49 (L) >60 mL/min    Comment: (NOTE) Calculated using the CKD-EPI Creatinine Equation (2021)    Anion gap 12 5 - 15    Comment: Performed at [facility], [address] [place], [place] [address]  CBC with Differential     Status: Abnormal   Collection Time: 11/18/23 11:25 PM  Result Value Ref Range   WBC 9.7 4.0 - 10.5 K/uL   RBC 3.64 (L) 3.87 - 5.11 MIL/uL   Hemoglobin 11.5 (L) 12.0 - 15.0 g/dL   HCT 66.9 (L) 63.9 - 53.9 %   MCV 90.7 80.0 - 100.0 fL   MCH 31.6 26.0 - 34.0 pg   MCHC 34.8 30.0 - 36.0 g/dL   RDW 86.3 88.4 - 84.4 %   Platelets 245 150 - 400 K/uL   nRBC 0.0 0.0 - 0.2 %   Neutrophils Relative % 73 %   Neutro Abs 7.2 1.7 - 7.7 K/uL  Lymphocytes Relative 16 %   Lymphs Abs 1.5 0.7 - 4.0 K/uL   Monocytes Relative 7 %   Monocytes Absolute 0.7 0.1 - 1.0 K/uL   Eosinophils Relative 2 %   Eosinophils Absolute 0.2 0.0 - 0.5 K/uL   Basophils Relative 1 %   Basophils Absolute 0.1 0.0 - 0.1 K/uL   Immature Granulocytes 1 %   Abs Immature Granulocytes 0.05 0.00 - 0.07 K/uL    Comment: Performed at [facility], [address], [place], [place] [address]  Protime-INR     Status: Abnormal   Collection Time: 11/18/23 11:25 PM  Result Value Ref Range   Prothrombin Time 23.1 (H) 11.4 - 15.2 seconds   INR 1.9 (H) 0.8 - 1.2    Comment: (NOTE) INR goal varies based on device and disease states. Performed at [facility], [address] [place], [place] [address]   Type and screen [facility]     Status: None   Collection Time: 11/18/23 11:25 PM  Result Value Ref Range   ABO/RH(D) O POS    Antibody Screen NEG    Sample Expiration      11/21/2023,2359 Performed at [facility], [address]  [place], [place] [address]   Protime-INR     Status: Abnormal   Collection Time: 11/19/23 12:16 PM  Result Value Ref Range   Prothrombin Time 18.9 (H) 11.4 - 15.2 seconds   INR 1.5 (H) 0.8 - 1.2    Comment: (NOTE) INR goal varies based on device and disease states. Performed at [facility], [address], [place], [place] [address]   Basic metabolic panel with GFR     Status: Abnormal   Collection Time: 11/19/23 12:16 PM  Result Value Ref Range   Sodium 134 (L) 135 - 145 mmol/L   Potassium 3.5 3.5 - 5.1 mmol/L   Chloride 103 98 - 111 mmol/L   CO2 24 22 - 32 mmol/L   Glucose, Bld 96 70 - 99 mg/dL    Comment: Glucose reference range applies only to samples taken after fasting for at least 8 hours.   BUN 17 8 - 23 mg/dL   Creatinine, Ser 9.17 0.44 - 1.00 mg/dL   Calcium  8.5 (L) 8.9 - 10.3 mg/dL   GFR, Estimated >39 >39 mL/min    Comment: (NOTE) Calculated using the CKD-EPI Creatinine Equation (2021)    Anion gap 7 5 - 15    Comment: Performed at [facility], [address] [place], [place] [address]  Magnesium      Status: Abnormal   Collection Time: 11/19/23 12:16 PM  Result Value Ref Range   Magnesium  1.4 (L) 1.7 - 2.4 mg/dL    Comment: Performed at [facility], [address], [place], [place] [address]    DG Chest Port 1 View Result Date: 11/18/2023 CLINICAL DATA:  Preoperative EXAM: PORTABLE CHEST 1 VIEW COMPARISON:  Chest x-ray 05/25/2023 FINDINGS: The heart size and mediastinal contours are within normal limits. Both lungs are clear. The visualized skeletal structures are unremarkable. IMPRESSION: No active disease. Electronically Signed   By: Greig Pique M.D.   On: 11/18/2023 23:21   DG Hip Unilat With Pelvis 2-3 Views Left Result Date: 11/18/2023 CLINICAL DATA:  Fall, left hip pain EXAM: DG HIP (WITH OR WITHOUT PELVIS) 2-3V LEFT COMPARISON:  None Available. FINDINGS: There is a left femoral intertrochanteric  fracture with varus angulation and mild displacement. No subluxation or dislocation. IMPRESSION: Left femoral  intertrochanteric fracture with varus angulation. Electronically Signed   By: Franky Crease M.D.   On: 11/18/2023 23:02   CT Head Wo Contrast Result Date: 11/18/2023 CLINICAL DATA:  Head trauma, minor (Age >= 65y); Neck trauma (Age >= 65y). Fall, head injury EXAM: CT HEAD WITHOUT CONTRAST CT CERVICAL SPINE WITHOUT CONTRAST TECHNIQUE: Multidetector CT imaging of the head and cervical spine was performed following the standard protocol without intravenous contrast. Multiplanar CT image reconstructions of the cervical spine were also generated. RADIATION DOSE REDUCTION: This exam was performed according to the departmental dose-optimization program which includes automated exposure control, adjustment of the mA and/or kV according to patient size and/or use of iterative reconstruction technique. COMPARISON:  None Available. FINDINGS: CT HEAD FINDINGS Brain: Normal anatomic configuration. Parenchymal volume loss is commensurate with the patient's age. Mild periventricular white matter changes are present likely reflecting the sequela of small vessel ischemia. No abnormal intra or extra-axial mass lesion or fluid collection. No abnormal mass effect or midline shift. No evidence of acute intracranial hemorrhage or infarct. Ventricular size is normal. Cerebellum unremarkable. Vascular: No asymmetric hyperdense vasculature at the skull base. Skull: Intact Sinuses/Orbits: Paranasal sinuses are clear. Orbits are unremarkable. Other: Mastoid air cells and middle ear cavities are clear. CT CERVICAL SPINE FINDINGS Alignment: Normal. Skull base and vertebrae: No acute fracture. No primary bone lesion or focal pathologic process. Soft tissues and spinal canal: No prevertebral fluid or swelling. No visible canal hematoma. Disc levels: Intervertebral disc space narrowing and endplate remodeling is seen throughout the  cervical spine in keeping with changes of diffuse comp moderate degenerative disc disease, most severe at C4-5. Prevertebral soft tissues are not thickened on sagittal reformats. Multilevel uncovertebral and facet arthrosis results in moderate to severe bilateral neuroforaminal narrowing at C3-4 and C4-5 and more moderate neuroforaminal narrowing on the left at C5-6. No high-grade canal stenosis. Upper chest: Negative. Other: 2.6 cm hypodense right thyroid  nodule noted, not well characterized on this examination. IMPRESSION: 1. No acute intracranial abnormality. No skull fracture. 2. No acute fracture or listhesis of the cervical spine. 3. 2.6 cm right thyroid  nodule, not well characterized on this examination. Recommend thyroid  US  (ref: J Am Coll Radiol. 2015 Feb;12(2): 143-50). 4. 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 Electronically Signed   By: Dorethia Molt M.D.   On: 11/18/2023 22:56   CT Cervical Spine Wo Contrast Result Date: 11/18/2023 CLINICAL DATA:  Head trauma, minor (Age >= 65y); Neck trauma (Age >= 65y). Fall, head injury EXAM: CT HEAD WITHOUT CONTRAST CT CERVICAL SPINE WITHOUT CONTRAST TECHNIQUE: Multidetector CT imaging of the head and cervical spine was performed following the standard protocol without intravenous contrast. Multiplanar CT image reconstructions of the cervical spine were also generated. RADIATION DOSE REDUCTION: This exam was performed according to the departmental dose-optimization program which includes automated exposure control, adjustment of the mA and/or kV according to patient size and/or use of iterative reconstruction technique. COMPARISON:  None Available. FINDINGS: CT HEAD FINDINGS Brain: Normal anatomic configuration. Parenchymal volume loss is commensurate with the patient's age. Mild  periventricular white matter changes are present likely reflecting the sequela of small vessel ischemia. No abnormal intra or extra-axial mass lesion or fluid collection. No abnormal mass effect or midline shift. No evidence of acute intracranial hemorrhage or infarct. Ventricular size is normal. Cerebellum unremarkable. Vascular: No asymmetric hyperdense vasculature  at the skull base. Skull: Intact Sinuses/Orbits: Paranasal sinuses are clear. Orbits are unremarkable. Other: Mastoid air cells and middle ear cavities are clear. CT CERVICAL SPINE FINDINGS Alignment: Normal. Skull base and vertebrae: No acute fracture. No primary bone lesion or focal pathologic process. Soft tissues and spinal canal: No prevertebral fluid or swelling. No visible canal hematoma. Disc levels: Intervertebral disc space narrowing and endplate remodeling is seen throughout the cervical spine in keeping with changes of diffuse comp moderate degenerative disc disease, most severe at C4-5. Prevertebral soft tissues are not thickened on sagittal reformats. Multilevel uncovertebral and facet arthrosis results in moderate to severe bilateral neuroforaminal narrowing at C3-4 and C4-5 and more moderate neuroforaminal narrowing on the left at C5-6. No high-grade canal stenosis. Upper chest: Negative. Other: 2.6 cm hypodense right thyroid  nodule noted, not well characterized on this examination. IMPRESSION: 1. No acute intracranial abnormality. No skull fracture. 2. No acute fracture or listhesis of the cervical spine. 3. 2.6 cm right thyroid  nodule, not well characterized on this examination. Recommend thyroid  US  (ref: J Am Coll Radiol. 2015 Feb;12(2): 143-50). 4. 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 Electronically Signed   By: Dorethia Molt M.D.   On: 11/18/2023 22:56     Review of Systems  Constitutional: Negative.   HENT: Negative.    Eyes: Negative.   Respiratory: Negative.         Sleep apnea  Cardiovascular:        Afib on coumadin   Gastrointestinal: Negative.   Endocrine: Negative.   Genitourinary: Negative.   Musculoskeletal:  Positive for arthralgias and myalgias.  Allergic/Immunologic: Negative.   Neurological: Negative.   Hematological:  Bruises/bleeds easily.  Psychiatric/Behavioral: Negative.     Blood pressure (!) 149/85, pulse 83, temperature 98.5 F (36.9 C), temperature source Oral, resp. rate 16, height 5' 1 (1.549 m), weight 78 kg, SpO2 98%. Physical Exam Constitutional:      Appearance: Normal appearance. She is obese.  HENT:     Head: Normocephalic and atraumatic.     Nose: Nose normal.  Eyes:     Pupils: Pupils are equal, round, and reactive to light.  Cardiovascular:     Pulses: Normal pulses.  Pulmonary:     Effort: Pulmonary effort is normal.  Musculoskeletal:        General: Tenderness and deformity present.     Cervical back: Normal range of motion.     Comments: Pt has obvious pain with palpation of left hip/thigh.  Sensation and pulses intact distally  Skin:    General: Skin is warm and dry.  Neurological:     General: No focal deficit present.     Mental Status: She is alert and oriented to person, place, and time. Mental status is at baseline.  Psychiatric:        Mood and Affect: Mood normal.        Behavior: Behavior normal.        Thought Content: Thought content normal.        Judgment: Judgment normal.     Assessment/Plan: Left Hip Intertrochanteric fracture  This patient was discussed with Alexandra [name] and she will need ORIF with nail/rod placement.  We plan to proceed with sx when medically safe and clearance from hospitalist service.  Most likely tomorrow.  Looks like pt has pain meds on board.  NPO.  Bed rest.  Camellia Ellen 11/19/2023, 2:08 PM

## 2023-11-19 NOTE — Progress Notes (Signed)
 PROGRESS NOTE    Patient: Alexandra [name]                            PCP: Bertell Satterfield, MD                    DOB: Jul 06, 1945            DOA: 11/18/2023 FMW:[id number]             DOS: 11/19/2023, 7:50 AM   LOS: 0 days   Date of Service: The patient was seen and examined on 11/19/2023  Subjective:   Chart was reviewed in detail, hemodynamically stable.  Apparently there was discussion orthopedic for ED to ED transfer For quick evaluation by recommendation  Brief Narrative:   RANAY KETTER is a 78 y.o. female with medical history significant of A-fib on warfarin, depression, gout, hypertension, hyperlipidemia who presented after tripping on a rug at home landing on her left side.  She developed severe pain.  She went to the emergency department where she was found to be febrile hemodynamically stable.  Labs were obtained which demonstrated sodium 2.9, creatinine 1.15.  INR 1.9.  Patient underwent chest x-ray which showed no acute findings.  X-ray of hip showed left femoral neck fracture.   CT spine showed no fracture.  CT head showed no acute abnormalities.  There was a thyroid  nodule recommended outpatient follow-up.   Orthopedics was still recommend transfer to [facility].  Patient was admitted for further workup.  She was given IV vitamin K  to reverse her therapeutic INR.   Assessment/Plan Principal Problem:   Hip fracture (HCC)     Left hip femoral neck fracture - Patient mechanical fall on rug - Angulated fracture with need for surgical intervention   N.p.o. Reverse warfarin-vitamin K  INR 1.9>> Per on-call orthopedic team request transferred to [facility] for surgical management   Paroxysmal A-fib-patient currently not in A-fib. On Diltiazem  and Warfarin-- on Hold  Continue beta-blocker   Hypertension- hold losartan    Hyperlipidemia-continue atorvastatin    Depression-continue Lexapro    Mood disorder-continue  Lamictal   ----------------------------------------------------------------------------------------------------------------------------------------------- Nutritional status:  The patient's BMI is: Body mass index is 32.49 kg/m. I agree with the assessment and plan as outlined  ------------------------------------------------------------------------------------------------------------------------------------------------  DVT prophylaxis:  SCDs Start: 11/19/23 0239   Code Status:   Code Status: Full Code  Family Communication: No family member present at bedside-  -Advance care planning has been discussed.   Admission status:   Status is: Inpatient Remains inpatient appropriate because: Needing hip surgery   Disposition: From  - home             Planning for discharge in 1-2 days   Procedures:   No admission procedures for hospital encounter.   Antimicrobials:  Anti-infectives (From admission, onward)    None        Medication:   atorvastatin   20 mg Oral Daily   escitalopram   20 mg Oral Daily   lamoTRIgine   150 mg Oral Daily   metoprolol  tartrate  50 mg Oral BID   pantoprazole   40 mg Oral Daily    acetaminophen  **OR** acetaminophen , fentaNYL  (SUBLIMAZE ) injection, ondansetron  **OR** ondansetron  (ZOFRAN ) IV, oxyCODONE    Objective:   Vitals:   11/19/23 0500 11/19/23 0530 11/19/23 0600 11/19/23 0630  BP: 132/63 117/60 125/67 122/64  Pulse: 75 72 78 70  Resp: 14 20 13 18   Temp:      TempSrc:  SpO2: 99% 92% 99% 100%  Weight:      Height:        Intake/Output Summary (Last 24 hours) at 11/19/2023 0750 Last data filed at 11/19/2023 0351 Gross per 24 hour  Intake 45.9 ml  Output --  Net 45.9 ml   Filed Weights   11/18/23 2223  Weight: 78 kg       ------------------------------------------------------------------------------------------------------------------------------------------    LABs:     Latest Ref Rng & Units 11/18/2023   11:25 PM  02/20/2022    5:27 PM 07/13/2007    5:15 AM  CBC  WBC 4.0 - 10.5 K/uL 9.7  8.4  10.3   Hemoglobin 12.0 - 15.0 g/dL 88.4  87.9  8.8   Hematocrit 36.0 - 46.0 % 33.0  34.5  26.5   Platelets 150 - 400 K/uL 245  317  378       Latest Ref Rng & Units 11/18/2023   11:25 PM 02/20/2022    5:27 PM 07/04/2008   10:55 AM  CMP  Glucose 70 - 99 mg/dL 876  895    BUN 8 - 23 mg/dL 20  30    Creatinine 9.55 - 1.00 mg/dL 8.84  8.80  8.99   Sodium 135 - 145 mmol/L 135  139    Potassium 3.5 - 5.1 mmol/L 2.9  3.7    Chloride 98 - 111 mmol/L 99  103    CO2 22 - 32 mmol/L 24  25    Calcium  8.9 - 10.3 mg/dL 9.2  9.7    Total Protein 6.5 - 8.1 g/dL  7.6    Total Bilirubin 0.3 - 1.2 mg/dL  0.8    Alkaline Phos 38 - 126 U/L  66    AST 15 - 41 U/L  36    ALT 0 - 44 U/L  39         Micro Results No results found for this or any previous visit (from the past 240 hours).  Radiology Reports DG Chest Port 1 View Result Date: 11/18/2023 CLINICAL DATA:  Preoperative EXAM: PORTABLE CHEST 1 VIEW COMPARISON:  Chest x-ray 05/25/2023 FINDINGS: The heart size and mediastinal contours are within normal limits. Both lungs are clear. The visualized skeletal structures are unremarkable. IMPRESSION: No active disease. Electronically Signed   By: Greig Pique M.D.   On: 11/18/2023 23:21   DG Hip Unilat With Pelvis 2-3 Views Left Result Date: 11/18/2023 CLINICAL DATA:  Fall, left hip pain EXAM: DG HIP (WITH OR WITHOUT PELVIS) 2-3V LEFT COMPARISON:  None Available. FINDINGS: There is a left femoral intertrochanteric fracture with varus angulation and mild displacement. No subluxation or dislocation. IMPRESSION: Left femoral intertrochanteric fracture with varus angulation. Electronically Signed   By: Franky Crease M.D.   On: 11/18/2023 23:02   CT Head Wo Contrast Result Date: 11/18/2023 CLINICAL DATA:  Head trauma, minor (Age >= 65y); Neck trauma (Age >= 65y). Fall, head injury EXAM: CT HEAD WITHOUT CONTRAST CT CERVICAL  SPINE WITHOUT CONTRAST TECHNIQUE: Multidetector CT imaging of the head and cervical spine was performed following the standard protocol without intravenous contrast. Multiplanar CT image reconstructions of the cervical spine were also generated. RADIATION DOSE REDUCTION: This exam was performed according to the departmental dose-optimization program which includes automated exposure control, adjustment of the mA and/or kV according to patient size and/or use of iterative reconstruction technique. COMPARISON:  None Available. FINDINGS: CT HEAD FINDINGS Brain: Normal anatomic configuration. Parenchymal volume loss is commensurate with the patient's age.  Mild periventricular white matter changes are present likely reflecting the sequela of small vessel ischemia. No abnormal intra or extra-axial mass lesion or fluid collection. No abnormal mass effect or midline shift. No evidence of acute intracranial hemorrhage or infarct. Ventricular size is normal. Cerebellum unremarkable. Vascular: No asymmetric hyperdense vasculature at the skull base. Skull: Intact Sinuses/Orbits: Paranasal sinuses are clear. Orbits are unremarkable. Other: Mastoid air cells and middle ear cavities are clear. CT CERVICAL SPINE FINDINGS Alignment: Normal. Skull base and vertebrae: No acute fracture. No primary bone lesion or focal pathologic process. Soft tissues and spinal canal: No prevertebral fluid or swelling. No visible canal hematoma. Disc levels: Intervertebral disc space narrowing and endplate remodeling is seen throughout the cervical spine in keeping with changes of diffuse comp moderate degenerative disc disease, most severe at C4-5. Prevertebral soft tissues are not thickened on sagittal reformats. Multilevel uncovertebral and facet arthrosis results in moderate to severe bilateral neuroforaminal narrowing at C3-4 and C4-5 and more moderate neuroforaminal narrowing on the left at C5-6. No high-grade canal stenosis. Upper chest:  Negative. Other: 2.6 cm hypodense right thyroid  nodule noted, not well characterized on this examination. IMPRESSION: 1. No acute intracranial abnormality. No skull fracture. 2. No acute fracture or listhesis of the cervical spine. 3. 2.6 cm right thyroid  nodule, not well characterized on this examination. Recommend thyroid  US  (ref: J Am Coll Radiol. 2015 Feb;12(2): 143-50). 4. 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 Electronically Signed   By: Dorethia Molt M.D.   On: 11/18/2023 22:56   CT Cervical Spine Wo Contrast Result Date: 11/18/2023 CLINICAL DATA:  Head trauma, minor (Age >= 65y); Neck trauma (Age >= 65y). Fall, head injury EXAM: CT HEAD WITHOUT CONTRAST CT CERVICAL SPINE WITHOUT CONTRAST TECHNIQUE: Multidetector CT imaging of the head and cervical spine was performed following the standard protocol without intravenous contrast. Multiplanar CT image reconstructions of the cervical spine were also generated. RADIATION DOSE REDUCTION: This exam was performed according to the departmental dose-optimization program which includes automated exposure control, adjustment of the mA and/or kV according to patient size and/or use of iterative reconstruction technique. COMPARISON:  None Available. FINDINGS: CT HEAD FINDINGS Brain: Normal anatomic configuration. Parenchymal volume loss is commensurate with the patient's age. Mild periventricular white matter changes are present likely reflecting the sequela of small vessel ischemia. No abnormal intra or extra-axial mass lesion or fluid collection. No abnormal mass effect or midline shift. No evidence of acute intracranial hemorrhage or infarct. Ventricular size is normal. Cerebellum unremarkable. Vascular: No asymmetric hyperdense vasculature at the skull base. Skull: Intact  Sinuses/Orbits: Paranasal sinuses are clear. Orbits are unremarkable. Other: Mastoid air cells and middle ear cavities are clear. CT CERVICAL SPINE FINDINGS Alignment: Normal. Skull base and vertebrae: No acute fracture. No primary bone lesion or focal pathologic process. Soft tissues and spinal canal: No prevertebral fluid or swelling. No visible canal hematoma. Disc levels: Intervertebral disc space narrowing and endplate remodeling is seen throughout the cervical spine in keeping with changes of diffuse comp moderate degenerative disc disease, most severe at C4-5. Prevertebral soft tissues are not thickened on sagittal reformats. Multilevel uncovertebral and facet arthrosis results in moderate to severe bilateral neuroforaminal narrowing at C3-4 and C4-5 and more moderate neuroforaminal narrowing on the left at C5-6. No high-grade canal stenosis. Upper chest: Negative. Other: 2.6 cm hypodense right thyroid  nodule noted,  not well characterized on this examination. IMPRESSION: 1. No acute intracranial abnormality. No skull fracture. 2. No acute fracture or listhesis of the cervical spine. 3. 2.6 cm right thyroid  nodule, not well characterized on this examination. Recommend thyroid  US  (ref: J Am Coll Radiol. 2015 Feb;12(2): 143-50). 4. 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 Electronically Signed   By: Dorethia Molt M.D.   On: 11/18/2023 22:56    SIGNED: Adriana DELENA Grams, MD, FHM. FAAFP. Jolynn Pack - [facility] Time spent - 55 min.  In seeing, evaluating and examining the patient. Reviewing medical records, labs, drawn plan of care. [facility],  Pager (please use [email or web address] to page/ text) Please use Epic Secure Chat for non-urgent communication (7AM-7PM)  If 7PM-7AM, please contact  night-coverage [email or web address], 11/19/2023, 7:50 AM

## 2023-11-19 NOTE — ED Notes (Signed)
 Patient arrived to facility via EMS from [facility] for L hip fracture. She is A&O, c/o pain to L hip but got Fentanyl  @0804  c/o fullness and nausea but has been sipping water  per RN giving report. She is on surgery board last set of vitals HR 70, 100% 3L via [place], 135/63, and R 18. No current distress.

## 2023-11-19 NOTE — Hospital Course (Signed)
 Alexandra [name] is a 78 y.o. female with medical history significant of A-fib on warfarin, depression, gout, hypertension, hyperlipidemia who presented after tripping on a rug at home landing on her left side.  She developed severe pain.  She went to the emergency department where she was found to be febrile hemodynamically stable.  Labs were obtained which demonstrated sodium 2.9, creatinine 1.15.  INR 1.9.  Patient underwent chest x-ray which showed no acute findings.  X-ray of hip showed left femoral neck fracture.   CT spine showed no fracture.  CT head showed no acute abnormalities.  There was a thyroid  nodule recommended outpatient follow-up.   Orthopedics was still recommend transfer to [facility].  Patient was admitted for further workup.  She was given IV vitamin K  to reverse her therapeutic INR.   Assessment/Plan Principal Problem:   Hip fracture (HCC)     Left hip femoral neck fracture - Patient mechanical fall on rug - Angulated fracture with need for surgical intervention   N.p.o. Reverse warfarin-vitamin K  INR 1.9>> Per on-call orthopedic team request transferred to Alexandra [name] for surgical management   Paroxysmal A-fib-patient currently not in A-fib. On Diltiazem  and Warfarin-- on Hold  Continue beta-blocker   Hypertension- hold losartan    Hyperlipidemia-continue atorvastatin    Depression-continue Lexapro    Mood disorder-continue Lamictal 

## 2023-11-19 NOTE — Plan of Care (Signed)

## 2023-11-20 ENCOUNTER — Inpatient Hospital Stay (HOSPITAL_COMMUNITY): Admitting: Anesthesiology

## 2023-11-20 ENCOUNTER — Encounter (HOSPITAL_COMMUNITY)
Admission: EM | Disposition: A | Discharge status: Skilled Nursing Facility | Payer: Self-pay | Source: Home / Self Care | Attending: Internal Medicine

## 2023-11-20 ENCOUNTER — Inpatient Hospital Stay (HOSPITAL_COMMUNITY)

## 2023-11-20 ENCOUNTER — Other Ambulatory Visit: Payer: Self-pay

## 2023-11-20 DIAGNOSIS — I10 Essential (primary) hypertension: Secondary | ICD-10-CM | POA: Diagnosis not present

## 2023-11-20 DIAGNOSIS — I4891 Unspecified atrial fibrillation: Secondary | ICD-10-CM | POA: Diagnosis not present

## 2023-11-20 DIAGNOSIS — G4733 Obstructive sleep apnea (adult) (pediatric): Secondary | ICD-10-CM | POA: Diagnosis not present

## 2023-11-20 DIAGNOSIS — W19XXXA Unspecified fall, initial encounter: Secondary | ICD-10-CM | POA: Diagnosis not present

## 2023-11-20 DIAGNOSIS — S72142A Displaced intertrochanteric fracture of left femur, initial encounter for closed fracture: Secondary | ICD-10-CM

## 2023-11-20 DIAGNOSIS — I480 Paroxysmal atrial fibrillation: Secondary | ICD-10-CM | POA: Diagnosis not present

## 2023-11-20 DIAGNOSIS — S72002A Fracture of unspecified part of neck of left femur, initial encounter for closed fracture: Secondary | ICD-10-CM | POA: Diagnosis not present

## 2023-11-20 HISTORY — PX: FEMUR IM NAIL: SHX1597

## 2023-11-20 LAB — CBC
HCT: 33.3 % — ABNORMAL LOW (ref 36.0–46.0)
HCT: 32.6 % — ABNORMAL LOW (ref 36.0–46.0)
Hemoglobin: 10.7 g/dL — ABNORMAL LOW (ref 12.0–15.0)
Hemoglobin: 10.6 g/dL — ABNORMAL LOW (ref 12.0–15.0)
MCH: 30.1 pg (ref 26.0–34.0)
MCH: 30.6 pg (ref 26.0–34.0)
MCHC: 32.1 g/dL (ref 30.0–36.0)
MCHC: 32.5 g/dL (ref 30.0–36.0)
MCV: 93.5 fL (ref 80.0–100.0)
MCV: 94.2 fL (ref 80.0–100.0)
Platelets: 223 K/uL (ref 150–400)
Platelets: 210 K/uL (ref 150–400)
RBC: 3.56 MIL/uL — ABNORMAL LOW (ref 3.87–5.11)
RBC: 3.46 MIL/uL — ABNORMAL LOW (ref 3.87–5.11)
RDW: 13.9 % (ref 11.5–15.5)
RDW: 14 % (ref 11.5–15.5)
WBC: 8.6 K/uL (ref 4.0–10.5)
WBC: 10.3 K/uL (ref 4.0–10.5)
nRBC: 0 % (ref 0.0–0.2)
nRBC: 0 % (ref 0.0–0.2)

## 2023-11-20 LAB — BASIC METABOLIC PANEL WITH GFR
Anion gap: 9 (ref 5–15)
BUN: 14 mg/dL (ref 8–23)
CO2: 24 mmol/L (ref 22–32)
Calcium: 8.8 mg/dL — ABNORMAL LOW (ref 8.9–10.3)
Chloride: 103 mmol/L (ref 98–111)
Creatinine, Ser: 0.75 mg/dL (ref 0.44–1.00)
GFR, Estimated: >60 mL/min (ref >60)
Glucose, Bld: 96 mg/dL (ref 70–99)
Potassium: 4.8 mmol/L (ref 3.5–5.1)
Sodium: 136 mmol/L (ref 135–145)

## 2023-11-20 LAB — CREATININE, SERUM
Creatinine, Ser: 0.87 mg/dL (ref 0.44–1.00)
GFR, Estimated: >60 mL/min (ref >60)

## 2023-11-20 LAB — PROTIME-INR
INR: 1.2 (ref 0.8–1.2)
Prothrombin Time: 16.1 s — ABNORMAL HIGH (ref 11.4–15.2)

## 2023-11-20 LAB — MAGNESIUM: Magnesium: 2 mg/dL (ref 1.7–2.4)

## 2023-11-20 SURGERY — INSERTION, INTRAMEDULLARY ROD, FEMUR
Anesthesia: General | Site: Hip | Laterality: Left

## 2023-11-20 MED ORDER — FENTANYL CITRATE PF 50 MCG/ML IJ SOSY
25.0000 ug | PREFILLED_SYRINGE | INTRAMUSCULAR | Status: DC | PRN
  Administered 2023-11-20: 50 ug via INTRAVENOUS

## 2023-11-20 MED ORDER — TRANEXAMIC ACID-NACL 1000-0.7 MG/100ML-% IV SOLN
INTRAVENOUS | Status: AC
  Filled 2023-11-20: qty 100

## 2023-11-20 MED ORDER — POVIDONE-IODINE 10 % EX SWAB: 2.0000 | Freq: Once | CUTANEOUS | Status: DC

## 2023-11-20 MED ORDER — ONDANSETRON HCL 4 MG/2ML IJ SOLN: 4.0000 mg | Freq: Once | INTRAMUSCULAR | Status: DC | PRN

## 2023-11-20 MED ORDER — BISACODYL 5 MG PO TBEC
5.0000 mg | DELAYED_RELEASE_TABLET | Freq: Every day | ORAL | Status: DC | PRN
  Administered 2023-11-22: 5 mg via ORAL
  Filled 2023-11-20: qty 1

## 2023-11-20 MED ORDER — CHLORHEXIDINE GLUCONATE 4 % EX SOLN: 60.0000 mL | Freq: Once | CUTANEOUS | Status: DC

## 2023-11-20 MED ORDER — OXYCODONE HCL 5 MG PO TABS: 10.0000 mg | ORAL_TABLET | ORAL | Status: DC | PRN

## 2023-11-20 MED ORDER — CHLORHEXIDINE GLUCONATE CLOTH 2 % EX PADS
6.0000 | MEDICATED_PAD | Freq: Once | CUTANEOUS | Status: AC
  Administered 2023-11-20: 6 via TOPICAL

## 2023-11-20 MED ORDER — TRAMADOL HCL 50 MG PO TABS
50.0000 mg | ORAL_TABLET | Freq: Four times a day (QID) | ORAL | Status: DC
  Administered 2023-11-20 – 2023-11-23 (×12): 50 mg via ORAL
  Filled 2023-11-20 (×12): qty 1

## 2023-11-20 MED ORDER — WARFARIN SODIUM 5 MG PO TABS
10.0000 mg | ORAL_TABLET | Freq: Once | ORAL | Status: AC
  Administered 2023-11-20: 10 mg via ORAL
  Filled 2023-11-20: qty 2

## 2023-11-20 MED ORDER — TRANEXAMIC ACID-NACL 1000-0.7 MG/100ML-% IV SOLN
1000.0000 mg | Freq: Once | INTRAVENOUS | Status: AC
  Administered 2023-11-20: 1000 mg via INTRAVENOUS
  Filled 2023-11-20: qty 100

## 2023-11-20 MED ORDER — OXYCODONE HCL 5 MG PO TABS: 5.0000 mg | ORAL_TABLET | Freq: Once | ORAL | Status: DC | PRN

## 2023-11-20 MED ORDER — FENTANYL CITRATE PF 50 MCG/ML IJ SOSY
PREFILLED_SYRINGE | INTRAMUSCULAR | Status: AC
Start: 2023-11-20 — End: 2023-11-20
  Filled 2023-11-20: qty 1

## 2023-11-20 MED ORDER — SUGAMMADEX SODIUM 200 MG/2ML IV SOLN
INTRAVENOUS | Status: DC | PRN
Start: 2023-11-20 — End: 2023-11-20
  Administered 2023-11-20: 200 mg via INTRAVENOUS

## 2023-11-20 MED ORDER — METHOCARBAMOL 1000 MG/10ML IJ SOLN
500.0000 mg | Freq: Four times a day (QID) | INTRAMUSCULAR | Status: DC | PRN
  Administered 2023-11-20: 500 mg via INTRAVENOUS

## 2023-11-20 MED ORDER — FLEET ENEMA RE ENEM: 1.0000 | ENEMA | Freq: Once | RECTAL | Status: DC | PRN

## 2023-11-20 MED ORDER — LACTATED RINGERS IV SOLN: INTRAVENOUS | Status: DC | PRN

## 2023-11-20 MED ORDER — PHENOL 1.4 % MT LIQD: 1.0000 | OROMUCOSAL | Status: DC | PRN

## 2023-11-20 MED ORDER — OXYCODONE HCL 5 MG/5ML PO SOLN: 5.0000 mg | Freq: Once | ORAL | Status: DC | PRN

## 2023-11-20 MED ORDER — PROPOFOL 10 MG/ML IV BOLUS
INTRAVENOUS | Status: DC | PRN
  Administered 2023-11-20: 70 mg via INTRAVENOUS

## 2023-11-20 MED ORDER — ENOXAPARIN SODIUM 30 MG/0.3ML IJ SOSY: 30.0000 mg | PREFILLED_SYRINGE | INTRAMUSCULAR | Status: DC

## 2023-11-20 MED ORDER — MENTHOL 3 MG MT LOZG: 1.0000 | LOZENGE | OROMUCOSAL | Status: DC | PRN

## 2023-11-20 MED ORDER — TIZANIDINE HCL 2 MG PO TABS: 2.0000 mg | ORAL_TABLET | Freq: Four times a day (QID) | ORAL | 0 refills | Status: DC | PRN

## 2023-11-20 MED ORDER — CEFAZOLIN SODIUM-DEXTROSE 2-3 GM-%(50ML) IV SOLR
INTRAVENOUS | Status: DC | PRN
  Administered 2023-11-20: 2 g via INTRAVENOUS

## 2023-11-20 MED ORDER — 0.9 % SODIUM CHLORIDE (POUR BTL) OPTIME
TOPICAL | Status: DC | PRN
  Administered 2023-11-20: 1000 mL

## 2023-11-20 MED ORDER — ACETAMINOPHEN 10 MG/ML IV SOLN: 1000.0000 mg | Freq: Once | INTRAVENOUS | Status: DC | PRN

## 2023-11-20 MED ORDER — ONDANSETRON HCL 4 MG/2ML IJ SOLN
INTRAMUSCULAR | Status: DC | PRN
  Administered 2023-11-20: 4 mg via INTRAVENOUS

## 2023-11-20 MED ORDER — DOCUSATE SODIUM 100 MG PO CAPS
100.0000 mg | ORAL_CAPSULE | Freq: Two times a day (BID) | ORAL | Status: DC
  Administered 2023-11-20 – 2023-11-23 (×6): 100 mg via ORAL
  Filled 2023-11-20 (×6): qty 1

## 2023-11-20 MED ORDER — WARFARIN - PHARMACIST DOSING INPATIENT: Freq: Every day | Status: DC

## 2023-11-20 MED ORDER — ONDANSETRON HCL 4 MG PO TABS: 4.0000 mg | ORAL_TABLET | Freq: Four times a day (QID) | ORAL | Status: DC | PRN

## 2023-11-20 MED ORDER — PHENYLEPHRINE 80 MCG/ML (10ML) SYRINGE FOR IV PUSH (FOR BLOOD PRESSURE SUPPORT)
PREFILLED_SYRINGE | INTRAVENOUS | Status: DC | PRN
  Administered 2023-11-20: 80 ug via INTRAVENOUS
  Administered 2023-11-20: 240 ug via INTRAVENOUS
  Administered 2023-11-20: 160 ug via INTRAVENOUS
  Administered 2023-11-20 (×3): 240 ug via INTRAVENOUS

## 2023-11-20 MED ORDER — DEXTROSE-SODIUM CHLORIDE 5-0.45 % IV SOLN: INTRAVENOUS | Status: DC

## 2023-11-20 MED ORDER — DEXAMETHASONE SODIUM PHOSPHATE 10 MG/ML IJ SOLN
INTRAMUSCULAR | Status: DC | PRN
  Administered 2023-11-20: 10 mg via INTRAVENOUS

## 2023-11-20 MED ORDER — FENTANYL CITRATE (PF) 250 MCG/5ML IJ SOLN
INTRAMUSCULAR | Status: DC | PRN
  Administered 2023-11-20: 50 ug via INTRAVENOUS
  Administered 2023-11-20 (×2): 25 ug via INTRAVENOUS

## 2023-11-20 MED ORDER — METOCLOPRAMIDE HCL 5 MG PO TABS: 5.0000 mg | ORAL_TABLET | Freq: Three times a day (TID) | ORAL | Status: DC | PRN

## 2023-11-20 MED ORDER — TRANEXAMIC ACID-NACL 1000-0.7 MG/100ML-% IV SOLN
INTRAVENOUS | Status: DC | PRN
Start: 2023-11-20 — End: 2023-11-20
  Administered 2023-11-20: 1000 mg via INTRAVENOUS

## 2023-11-20 MED ORDER — LIDOCAINE HCL (PF) 2 % IJ SOLN
INTRAMUSCULAR | Status: DC | PRN
  Administered 2023-11-20: 80 mg via INTRADERMAL

## 2023-11-20 MED ORDER — SUCCINYLCHOLINE CHLORIDE 200 MG/10ML IV SOSY
PREFILLED_SYRINGE | INTRAVENOUS | Status: DC | PRN
  Administered 2023-11-20: 100 mg via INTRAVENOUS

## 2023-11-20 MED ORDER — PANTOPRAZOLE SODIUM 40 MG PO TBEC
40.0000 mg | DELAYED_RELEASE_TABLET | Freq: Every day | ORAL | Status: DC
  Administered 2023-11-21 – 2023-11-23 (×3): 40 mg via ORAL
  Filled 2023-11-20 (×3): qty 1

## 2023-11-20 MED ORDER — TRANEXAMIC ACID-NACL 1000-0.7 MG/100ML-% IV SOLN: 1000.0000 mg | INTRAVENOUS | Status: DC

## 2023-11-20 MED ORDER — OXYCODONE-ACETAMINOPHEN 5-325 MG PO TABS: 1.0000 | ORAL_TABLET | ORAL | 0 refills | Status: DC | PRN

## 2023-11-20 MED ORDER — ACETAMINOPHEN 325 MG PO TABS: 325.0000 mg | ORAL_TABLET | Freq: Four times a day (QID) | ORAL | Status: DC | PRN

## 2023-11-20 MED ORDER — SENNOSIDES-DOCUSATE SODIUM 8.6-50 MG PO TABS: 1.0000 | ORAL_TABLET | Freq: Every evening | ORAL | Status: DC | PRN

## 2023-11-20 MED ORDER — CEFAZOLIN SODIUM-DEXTROSE 2-4 GM/100ML-% IV SOLN: 2.0000 g | INTRAVENOUS | Status: DC

## 2023-11-20 MED ORDER — HYDROCHLOROTHIAZIDE 25 MG PO TABS
25.0000 mg | ORAL_TABLET | Freq: Every day | ORAL | Status: DC
  Administered 2023-11-20 – 2023-11-23 (×4): 25 mg via ORAL
  Filled 2023-11-20 (×4): qty 1

## 2023-11-20 MED ORDER — KCL IN DEXTROSE-NACL 20-5-0.45 MEQ/L-%-% IV SOLN
INTRAVENOUS | Status: DC
  Filled 2023-11-20 (×3): qty 1000

## 2023-11-20 MED ORDER — FENTANYL CITRATE (PF) 100 MCG/2ML IJ SOLN
INTRAMUSCULAR | Status: AC
  Filled 2023-11-20: qty 2

## 2023-11-20 MED ORDER — OXYCODONE HCL 5 MG PO TABS
5.0000 mg | ORAL_TABLET | ORAL | Status: DC | PRN
  Administered 2023-11-21: 10 mg via ORAL
  Filled 2023-11-20 (×2): qty 2

## 2023-11-20 MED ORDER — STERILE WATER FOR IRRIGATION IR SOLN
Status: DC | PRN
  Administered 2023-11-20: 2000 mL

## 2023-11-20 MED ORDER — HYDROMORPHONE HCL 1 MG/ML IJ SOLN: 0.5000 mg | INTRAMUSCULAR | Status: DC | PRN

## 2023-11-20 MED ORDER — ACETAMINOPHEN 500 MG PO TABS
1000.0000 mg | ORAL_TABLET | Freq: Four times a day (QID) | ORAL | Status: AC
  Administered 2023-11-20 – 2023-11-21 (×4): 1000 mg via ORAL
  Filled 2023-11-20 (×4): qty 2

## 2023-11-20 MED ORDER — ROCURONIUM BROMIDE 10 MG/ML (PF) SYRINGE
PREFILLED_SYRINGE | INTRAVENOUS | Status: DC | PRN
  Administered 2023-11-20: 30 mg via INTRAVENOUS

## 2023-11-20 MED ORDER — METHOCARBAMOL 1000 MG/10ML IJ SOLN
INTRAMUSCULAR | Status: AC
  Filled 2023-11-20: qty 10

## 2023-11-20 MED ORDER — ONDANSETRON HCL 4 MG/2ML IJ SOLN: 4.0000 mg | Freq: Four times a day (QID) | INTRAMUSCULAR | Status: DC | PRN

## 2023-11-20 MED ORDER — CEFAZOLIN SODIUM-DEXTROSE 2-4 GM/100ML-% IV SOLN
INTRAVENOUS | Status: AC
  Filled 2023-11-20: qty 100

## 2023-11-20 MED ORDER — METOCLOPRAMIDE HCL 5 MG/ML IJ SOLN: 5.0000 mg | Freq: Three times a day (TID) | INTRAMUSCULAR | Status: DC | PRN

## 2023-11-20 MED ORDER — PHENYLEPHRINE HCL-NACL 20-0.9 MG/250ML-% IV SOLN
INTRAVENOUS | Status: DC | PRN
Start: 2023-11-20 — End: 2023-11-20
  Administered 2023-11-20: 40 ug/min via INTRAVENOUS

## 2023-11-20 MED ORDER — METHOCARBAMOL 500 MG PO TABS
500.0000 mg | ORAL_TABLET | Freq: Four times a day (QID) | ORAL | Status: DC | PRN
  Administered 2023-11-21 – 2023-11-23 (×4): 500 mg via ORAL
  Filled 2023-11-20 (×5): qty 1

## 2023-11-20 SURGICAL SUPPLY — 33 items
BAG ZIPLOCK 12X15 (MISCELLANEOUS) IMPLANT
BIT DRILL 4.3MMS DISTAL GRDTED (BIT) IMPLANT
BLADE SURG 15 STRL LF DISP TIS (BLADE) ×2 IMPLANT
BNDG COHESIVE 6X5 TAN ST LF (GAUZE/BANDAGES/DRESSINGS) ×2 IMPLANT
BNDG GAUZE DERMACEA FLUFF 4 (GAUZE/BANDAGES/DRESSINGS) ×2 IMPLANT
COVER SURGICAL LIGHT HANDLE (MISCELLANEOUS) ×2 IMPLANT
DRAPE C-ARMOR (DRAPES) ×2 IMPLANT
DRAPE STERI IOBAN 125X83 (DRAPES) ×2 IMPLANT
DRSG AQUACEL AG ADV 3.5X 4 (GAUZE/BANDAGES/DRESSINGS) ×4 IMPLANT
DURAPREP 26ML APPLICATOR (WOUND CARE) ×4 IMPLANT
ELECT REM PT RETURN 15FT ADLT (MISCELLANEOUS) ×2 IMPLANT
GLOVE BIO SURGEON STRL SZ8 (GLOVE) ×4 IMPLANT
GLOVE BIO SURGEON STRL SZ8.5 (GLOVE) IMPLANT
GLOVE BIOGEL PI IND STRL 7.0 (GLOVE) ×2 IMPLANT
GLOVE BIOGEL PI IND STRL 8 (GLOVE) ×4 IMPLANT
GLOVE BIOGEL PI IND STRL 9 (GLOVE) IMPLANT
GOWN SRG XL LVL 4 BRTHBL STRL (GOWNS) ×2 IMPLANT
GOWN STRL REUS W/ TWL XL LVL3 (GOWN DISPOSABLE) ×4 IMPLANT
GOWN STRL REUS W/TWL XL LVL3 (GOWN DISPOSABLE) IMPLANT
GUIDEPIN VERSANAIL DSP 3.2X444 (ORTHOPEDIC DISPOSABLE SUPPLIES) IMPLANT
GUIDEWIRE BALL NOSE 100CM (WIRE) IMPLANT
KIT BASIN OR (CUSTOM PROCEDURE TRAY) ×2 IMPLANT
KIT TURNOVER KIT A (KITS) ×2 IMPLANT
MANIFOLD NEPTUNE II (INSTRUMENTS) ×2 IMPLANT
NAIL HIP FRA AFFIX 130X9X300 L (Nail) IMPLANT
PACK GENERAL/GYN (CUSTOM PROCEDURE TRAY) ×2 IMPLANT
PAD ARMBOARD POSITIONER FOAM (MISCELLANEOUS) ×2 IMPLANT
PROTECTOR NERVE ULNAR (MISCELLANEOUS) ×2 IMPLANT
SCREW BONE CORTICAL 5.0X38 (Screw) IMPLANT
SCREW CANN THRD AFF 10.5X100 (Screw) IMPLANT
STAPLER SKIN PROX 35W (STAPLE) ×2 IMPLANT
SUT VIC AB 0 CT1 36 (SUTURE) ×2 IMPLANT
SUT VIC AB 2-0 CT1 TAPERPNT 27 (SUTURE) ×2 IMPLANT

## 2023-11-20 NOTE — Anesthesia Postprocedure Evaluation (Signed)
 Anesthesia Post Note  Patient: Alexandra [name]  Procedure(s) Performed: INSERTION, INTRAMEDULLARY ROD, FEMUR (Left: Hip)     Patient location during evaluation: PACU Anesthesia Type: General Level of consciousness: awake and alert Pain management: pain level controlled Vital Signs Assessment: post-procedure vital signs reviewed and stable Respiratory status: spontaneous breathing, nonlabored ventilation, respiratory function stable and patient connected to nasal cannula oxygen Cardiovascular status: blood pressure returned to baseline and stable Postop Assessment: no apparent nausea or vomiting Anesthetic complications: no   No notable events documented.  Last Vitals:  Vitals:   11/20/23 1000 11/20/23 1127  BP: (!) 148/69 (!) 145/62  Pulse:  67  Resp:  18  Temp:  (!) 36.3 C  SpO2:  97%    Last Pain:  Vitals:   11/20/23 1127  TempSrc: Oral  PainSc:                  Rome Ade

## 2023-11-20 NOTE — Progress Notes (Addendum)
  Progress Note   Patient: Alexandra [name] FMW:[id number] DOB: 1945-08-27 DOA: 11/18/2023     1 DOS: the patient was seen and examined on 11/20/2023   Brief hospital course: 78 y.o. female with medical history significant of A-fib on warfarin, depression, gout, hypertension, hyperlipidemia who presented after tripping on a rug at home landing on her left side.  She developed severe pain.  She went to the emergency department where she was found to be febrile hemodynamically stable.  Labs were obtained which demonstrated sodium 2.9, creatinine 1.15.  INR 1.9.  Patient underwent chest x-ray which showed no acute findings.  X-ray of hip showed left femoral neck fracture.   CT spine showed no fracture.  CT head showed no acute abnormalities.  There was a thyroid  nodule recommended outpatient follow-up.   Orthopedics was still recommend transfer to [facility].  Patient was admitted for further workup.  She was given IV vitamin K  to reverse her therapeutic INR.  Assessment and Plan: Left hip femoral neck fracture - Patient mechanical fall on rug - Angulated fracture with need for surgical intervention -preoperatively, warfarin was reversed with vitamin K  -Orthopedic surgery following and pt is now s/p surgery 7/27.  -f/u with therapy recs   Paroxysmal A-fib-patient currently not in A-fib. On Diltiazem   Continue beta-blocker -Coumadin  was reversed prior to surgery, would resume anticoag when OK with surgery -Discussed with patient. Pt chose to be on coumadin  purely because of cost reasons, but is interested in DOAC if affordable now. -Will plan to d/w pharmacy regarding updated drug costs   Hypertension- held losartan  -cont cardizem  and BB   Hyperlipidemia-continue atorvastatin    Depression-continue Lexapro    Mood disorder-continue Lamictal    Chronic anticoagulation      Subjective: Pt seen post-op. Without complaints  Physical Exam: Vitals:   11/20/23 0946 11/20/23 1000 11/20/23  1127 11/20/23 1423  BP: 139/73 (!) 148/69 (!) 145/62 126/63  Pulse:   67 73  Resp:   18 18  Temp: 98.7 F (37.1 C)  (!) 97.4 F (36.3 C) 98.8 F (37.1 C)  TempSrc:   Oral Oral  SpO2:   97% 97%  Weight:      Height:       General exam: Awake, laying in bed, in nad Respiratory system: Normal respiratory effort, no wheezing Cardiovascular system: regular rate, s1, s2 Gastrointestinal system: Soft, nondistended, positive BS Central nervous system: CN2-12 grossly intact, strength intact Extremities: Perfused, no clubbing Skin: Normal skin turgor, no notable skin lesions seen Psychiatry: Mood normal // no visual hallucinations   Data Reviewed:  Labs reviewed: Na 136, K 4.6, Cr 0.75, WBC 10.3, Hgb 10.6, Plts 210  Family Communication: Pt in room, family at bedside  Disposition: Status is: Inpatient Remains inpatient appropriate because: severity of illness  Planned Discharge Destination: Pending therapy eval     Author: Garnette Pelt, MD 11/20/2023 4:29 PM  For on call review [email or web address].

## 2023-11-20 NOTE — Progress Notes (Signed)
 PHARMACY - ANTICOAGULATION CONSULT NOTE  Pharmacy Consult for Warfarin Indication: atrial fibrillation  Allergies  Allergen Reactions   Codeine Nausea And Vomiting and Nausea Only   Naproxen Itching    Patient Measurements: Height: 5' 1 (154.9 cm) Weight: 78 kg (171 lb 15.3 oz) IBW/kg (Calculated) : 47.8 HEPARIN DW (KG): 65.2  Vital Signs: Temp: 98.7 F (37.1 C) (07/27 0946) Temp Source: Oral (07/27 0542) BP: 148/69 (07/27 1000) Pulse Rate: 69 (07/27 0710)  Labs: Recent Labs    11/18/23 2325 11/19/23 1216 11/19/23 1420 11/20/23 0333  HGB 11.5*  --   --  10.7*  HCT 33.0*  --   --  33.3*  PLT 245  --   --  223  LABPROT 23.1* 18.9* 18.1* 16.1*  INR 1.9* 1.5* 1.4* 1.2  CREATININE 1.15* 0.82  --  0.75    Estimated Creatinine Clearance: 54.8 mL/min (by C-G formula based on SCr of 0.75 mg/dL).   Medical History: Past Medical History:  Diagnosis Date   Depression    Gout    High cholesterol    Hypertension     Assessment: Active Problem(s): L hip fx s/p fall. INR 1.9  AC/Heme: Warfarin PTA. HOLD for hip fx. - PTA dose: 6mg  MTThF, and 9mg  Wed/Sat  - 7/26: Admit INR 1.9. Vit K 5mg  IV x 1 INR down to 1.2 - 7/27: INR 1.2. Hgb 10.7, Lov 30mg /d, Resume warfarin  Goal of Therapy:  INR 2-3 Monitor platelets by anticoagulation protocol: Yes   Plan:  - 7/27: hip surgery Resume warfarin 10mg  po x 1 tonight. Con't Daily INR   Nena Hampe Karoline Marina, PharmD, BCPS Clinical Staff Pharmacist Marina Salines Stillinger 11/20/2023,11:04 AM

## 2023-11-20 NOTE — Brief Op Note (Signed)
 Alexandra [name] [id number] 11/20/2023   PRE-OP DIAGNOSIS: left IT hip fracture  POST-OP DIAGNOSIS: same  PROCEDURE: left troch nail  ANESTHESIA: general  Maude KANDICE Herald   Dictation #:  [id number]

## 2023-11-20 NOTE — Plan of Care (Signed)

## 2023-11-20 NOTE — Anesthesia Preprocedure Evaluation (Addendum)
 Anesthesia Evaluation  Patient identified by MRN, date of birth, ID band Patient awake    Reviewed: Allergy & Precautions, NPO status , Patient's Chart, lab work & pertinent test results  History of Anesthesia Complications Negative for: history of anesthetic complications  Airway Mallampati: III  TM Distance: >3 FB Neck ROM: Full    Dental  (+) Edentulous Upper, Edentulous Lower   Pulmonary sleep apnea , neg COPD, Patient abstained from smoking.Not current smoker   Pulmonary exam normal breath sounds clear to auscultation       Cardiovascular Exercise Tolerance: Good METShypertension, Pt. on medications (-) CAD and (-) Past MI + dysrhythmias Atrial Fibrillation  Rhythm:Regular Rate:Normal - Systolic murmurs Normally independent and active without chest pain or SOB.  TTE 2024 1. Left ventricular ejection fraction, by estimation, is 60 to 65%. The  left ventricle has normal function. The left ventricle has no regional  wall motion abnormalities. There is mild left ventricular hypertrophy.  Left ventricular diastolic parameters  are consistent with Grade I diastolic dysfunction (impaired relaxation).  The average left ventricular global longitudinal strain is -23.9 %. The  global longitudinal strain is normal.   2. Right ventricular systolic function is normal. The right ventricular  size is normal. Tricuspid regurgitation signal is inadequate for assessing  PA pressure.   3. Left atrial size was moderately dilated.   4. The mitral valve is normal in structure. Trivial mitral valve  regurgitation. No evidence of mitral stenosis.   5. The aortic valve is tricuspid. There is mild calcification of the  aortic valve. There is mild thickening of the aortic valve. Aortic valve  regurgitation is not visualized. No aortic stenosis is present.   6. The inferior vena cava is normal in size with greater than 50%  respiratory variability,  suggesting right atrial pressure of 3 mmHg.     Neuro/Psych  PSYCHIATRIC DISORDERS  Depression    negative neurological ROS     GI/Hepatic ,GERD  Poorly Controlled,,(+)     (-) substance abuse  Active GERD symptoms today, belching and coughing.   Endo/Other  neg diabetes    Renal/GU negative Renal ROS     Musculoskeletal   Abdominal   Peds  Hematology Last warfarin dose two days ago.   Anesthesia Other Findings Past Medical History: No date: Depression No date: Gout No date: High cholesterol No date: Hypertension  Reproductive/Obstetrics                              Anesthesia Physical Anesthesia Plan  ASA: 3  Anesthesia Plan: General   Post-op Pain Management: Tylenol  PO (pre-op)*   Induction: Intravenous and Rapid sequence  PONV Risk Score and Plan: 4 or greater and Ondansetron , Dexamethasone  and Treatment may vary due to age or medical condition  Airway Management Planned: Oral ETT  Additional Equipment: None  Intra-op Plan:   Post-operative Plan: Extubation in OR  Informed Consent: I have reviewed the patients History and Physical, chart, labs and discussed the procedure including the risks, benefits and alternatives for the proposed anesthesia with the patient or authorized representative who has indicated his/her understanding and acceptance.     Dental advisory given  Plan Discussed with: CRNA and Surgeon  Anesthesia Plan Comments: (Plan for RSI given active GERD. Not spinal candidate due to recent warfarin use. Discussed risks of anesthesia with patient, including PONV, sore throat, lip/dental/eye damage. Rare risks discussed as well, such as cardiorespiratory  and neurological sequelae, and allergic reactions. Discussed the role of CRNA in patient's perioperative care. Patient understands.)        Anesthesia Quick Evaluation

## 2023-11-20 NOTE — Op Note (Signed)
 NAME: Alexandra [name], Alexandra C. MEDICAL RECORD NO: [id number] ACCOUNT NO: [id number] DATE OF BIRTH: 19-Sep-1945 FACILITY: [facility] LOCATION: [id number] PHYSICIAN: Maude MATSU. Sheril, MD  Operative Report   DATE OF PROCEDURE: 11/20/2023  PREOPERATIVE DIAGNOSIS: Left hip intertrochanteric fracture.  POSTOPERATIVE DIAGNOSIS: Left hip intertrochanteric fracture.  PROCEDURE PERFORMED: Left hip trochanteric nail.  ANESTHESIA:  General.  ATTENDING SURGEON: Maude MATSU. Sahra Converse, MD  ASSISTANT: Camellia Ellen, PA  INDICATIONS FOR THE PROCEDURE:  The patient is a 78 year old woman who fell about a day ago and sustained a displaced left hip intertrochanteric fracture. She was admitted to the Medicine Service and stabilized for surgical intervention as she is a  Tourist information centre manager. She has a history of rheumatoid arthritis and other joint replacements. She is also on Coumadin  and her protime had to be adjusted by the medical team prior to the operation. Informed operative consent was obtained.  SUMMARY OF FINDINGS AND PROCEDURE: Under general anesthesia, a left hip fracture was reduced on the fracture table. We then stabilized with the Biomet Affixus system using a 9 x 300 130-degree nail with a 100 mm locking screw in the femoral neck followed  by a 38 mm distal locking screw placed in static position. She had fairly poor bone quality. I used fluoroscopy throughout the case to make appropriate intraoperative decisions and read these views myself. Camellia Ellen assisted throughout and was  invaluable to the completion of the case and that he helped maintain reduction and also performed portions of the case intraoperatively. He also closed to help minimize OR time.  DESCRIPTION OF PROCEDURE: The patient was taken to the operating suite where general anesthetic was applied without difficulty. She was positioned on the fracture table. With some traction and internal rotation, we were able to reduce the  intertrochanteric  fracture. I then made an incision above the greater trochanter with percutaneous dissection down to the greater trochanter. We placed a guidewire intramedullary. It seemed to be in that position by fluoroscopy in two planes. We then  overreamed proximally and then distally. We placed the aforementioned Affixus nail in appropriate position. I made a separate stab wound to place the locking screw in the femur. She actually had fairly good bone in the femoral head. I then placed the  distal locking screw in the static position through a separate incision. The wounds were then thoroughly irrigated. Reapproximated deep tissues with Vicryl followed by skin closure with subcuticular stitch. Adaptic was applied followed by dry gauze and  tape. Estimated blood loss and intraoperative fluid was obtained from anesthesia records.  DISPOSITION: The patient was extubated in the operating room and taken to the recovery room in stable condition. Plans were for her to be admitted back to the Medicine Service for appropriate postop care and she will resume her Coumadin  immediately.       PAA D: 11/20/2023 9:04:49 am T: 11/20/2023 10:05:00 pm  JOB: [id number]/ [id number]

## 2023-11-20 NOTE — Interval H&P Note (Signed)
 History and Physical Interval Note:  11/20/2023 7:13 AM  Alexandra [name]  has presented today for surgery, with the diagnosis of left hip fracture.  The various methods of treatment have been discussed with the patient and family. After consideration of risks, benefits and other options for treatment, the patient has consented to  Procedure(s): INSERTION, INTRAMEDULLARY ROD, FEMUR (Left) as a surgical intervention.  The patient's history has been reviewed, patient examined, no change in status, stable for surgery.  I have reviewed the patient's chart and labs.  Questions were answered to the patient's satisfaction.     Coreena Rubalcava G Glennette Galster

## 2023-11-20 NOTE — Anesthesia Procedure Notes (Signed)
 Procedure Name: Intubation Date/Time: 11/20/2023 7:49 AM  Performed by: Claudene Hoy CROME, CRNAPre-anesthesia Checklist: Patient identified, Emergency Drugs available, Suction available and Patient being monitored Patient Re-evaluated:Patient Re-evaluated prior to induction Oxygen Delivery Method: Circle System Utilized Preoxygenation: Pre-oxygenation with 100% oxygen Induction Type: IV induction, Rapid sequence and Cricoid Pressure applied Laryngoscope Size: 3 and Mac Grade View: Grade I Tube type: Oral Tube size: 6.5 mm Number of attempts: 1 Airway Equipment and Method: Stylet Placement Confirmation: ETT inserted through vocal cords under direct vision, positive ETCO2 and breath sounds checked- equal and bilateral Secured at: 20 cm Tube secured with: Tape Dental Injury: Teeth and Oropharynx as per pre-operative assessment

## 2023-11-20 NOTE — Transfer of Care (Signed)
 Immediate Anesthesia Transfer of Care Note  Patient: Alexandra [name]  Procedure(s) Performed: INSERTION, INTRAMEDULLARY ROD, FEMUR (Left: Hip)  Patient Location: PACU  Anesthesia Type:General  Level of Consciousness: drowsy and patient cooperative  Airway & Oxygen Therapy: Patient Spontanous Breathing  Post-op Assessment: Report given to RN and Post -op Vital signs reviewed and stable  Post vital signs: Reviewed and stable  Last Vitals:  Vitals Value Taken Time  BP 139/73   Temp    Pulse 90   Resp 17   SpO2 98     Last Pain:  Vitals:   11/20/23 0710  TempSrc:   PainSc: 3       Patients Stated Pain Goal: 0 (11/20/23 0412)  Complications: No notable events documented.

## 2023-11-21 ENCOUNTER — Other Ambulatory Visit (HOSPITAL_COMMUNITY): Payer: Self-pay

## 2023-11-21 ENCOUNTER — Encounter (HOSPITAL_COMMUNITY): Payer: Self-pay | Admitting: Orthopaedic Surgery

## 2023-11-21 ENCOUNTER — Telehealth (HOSPITAL_COMMUNITY): Payer: Self-pay | Admitting: Pharmacy Technician

## 2023-11-21 LAB — COMPREHENSIVE METABOLIC PANEL WITH GFR
ALT: 16 U/L (ref 0–44)
AST: 27 U/L (ref 15–41)
Albumin: 2.6 g/dL — ABNORMAL LOW (ref 3.5–5.0)
Alkaline Phosphatase: 60 U/L (ref 38–126)
Anion gap: 8 (ref 5–15)
BUN: 13 mg/dL (ref 8–23)
CO2: 24 mmol/L (ref 22–32)
Calcium: 8.2 mg/dL — ABNORMAL LOW (ref 8.9–10.3)
Chloride: 103 mmol/L (ref 98–111)
Creatinine, Ser: 0.84 mg/dL (ref 0.44–1.00)
GFR, Estimated: >60 mL/min (ref >60)
Glucose, Bld: 139 mg/dL — ABNORMAL HIGH (ref 70–99)
Potassium: 4.1 mmol/L (ref 3.5–5.1)
Sodium: 135 mmol/L (ref 135–145)
Total Bilirubin: 0.8 mg/dL (ref 0.0–1.2)
Total Protein: 6 g/dL — ABNORMAL LOW (ref 6.5–8.1)

## 2023-11-21 LAB — CBC
HCT: 27.1 % — ABNORMAL LOW (ref 36.0–46.0)
Hemoglobin: 9.1 g/dL — ABNORMAL LOW (ref 12.0–15.0)
MCH: 31.5 pg (ref 26.0–34.0)
MCHC: 33.6 g/dL (ref 30.0–36.0)
MCV: 93.8 fL (ref 80.0–100.0)
Platelets: 196 K/uL (ref 150–400)
RBC: 2.89 MIL/uL — ABNORMAL LOW (ref 3.87–5.11)
RDW: 13.7 % (ref 11.5–15.5)
WBC: 10.8 K/uL — ABNORMAL HIGH (ref 4.0–10.5)
nRBC: 0 % (ref 0.0–0.2)

## 2023-11-21 LAB — PROTIME-INR
INR: 1.3 — ABNORMAL HIGH (ref 0.8–1.2)
Prothrombin Time: 16.5 s — ABNORMAL HIGH (ref 11.4–15.2)

## 2023-11-21 MED ORDER — ENOXAPARIN SODIUM 40 MG/0.4ML IJ SOSY
40.0000 mg | PREFILLED_SYRINGE | INTRAMUSCULAR | Status: DC
  Administered 2023-11-21: 40 mg via SUBCUTANEOUS
  Filled 2023-11-21: qty 0.4

## 2023-11-21 MED ORDER — APIXABAN 5 MG PO TABS
5.0000 mg | ORAL_TABLET | Freq: Two times a day (BID) | ORAL | Status: DC
  Administered 2023-11-21 – 2023-11-23 (×4): 5 mg via ORAL
  Filled 2023-11-21 (×4): qty 1

## 2023-11-21 MED ORDER — OXYCODONE-ACETAMINOPHEN 5-325 MG PO TABS: 1.0000 | ORAL_TABLET | ORAL | 0 refills | Status: DC | PRN

## 2023-11-21 MED ORDER — TIZANIDINE HCL 2 MG PO TABS
2.0000 mg | ORAL_TABLET | Freq: Four times a day (QID) | ORAL | 0 refills | Status: DC | PRN
Start: 2023-11-21 — End: 2024-02-23

## 2023-11-21 NOTE — Telephone Encounter (Signed)
 Patient Product/process development scientist completed.    The patient is insured through [employer]. Patient has Medicare and is not eligible for a copay card, but may be able to apply for patient assistance or Medicare RX Payment Plan (Patient Must reach out to their plan, if eligible for payment plan), if available.    Ran test claim for Eliquis 5 mg and the current 30 day co-pay is $0.00.  Ran test claim for Xarelto 20 mg and the current 30 day co-pay is $0.00.  This test claim was processed through [facility]- copay amounts may vary at other pharmacies due to pharmacy/plan contracts, or as the patient moves through the different stages of their insurance plan.     Roland Earl, CPHT Pharmacy Technician III Certified Patient Advocate [facility] Patient Advocate Team Direct Number: [phone]  Fax: [phone]

## 2023-11-21 NOTE — Plan of Care (Signed)
  Problem: Education: Goal: Knowledge of General Education information will improve Description: Including pain rating scale, medication(s)/side effects and non-pharmacologic comfort measures Outcome: Progressing   Problem: Health Behavior/Discharge Planning: Goal: Ability to manage health-related needs will improve Outcome: Progressing   Problem: Clinical Measurements: Goal: Ability to maintain clinical measurements within normal limits will improve Outcome: Progressing Goal: Will remain free from infection Outcome: Progressing Goal: Diagnostic test results will improve Outcome: Progressing Goal: Respiratory complications will improve Outcome: Progressing Goal: Cardiovascular complication will be avoided Outcome: Progressing   Problem: Activity: Goal: Risk for activity intolerance will decrease Outcome: Progressing   Problem: Nutrition: Goal: Adequate nutrition will be maintained Outcome: Progressing   Problem: Coping: Goal: Level of anxiety will decrease Outcome: Progressing   Problem: Elimination: Goal: Will not experience complications related to bowel motility Outcome: Progressing Goal: Will not experience complications related to urinary retention Outcome: Completed/Met   Problem: Pain Managment: Goal: General experience of comfort will improve and/or be controlled Outcome: Progressing   Problem: Safety: Goal: Ability to remain free from injury will improve Outcome: Progressing   Problem: Skin Integrity: Goal: Risk for impaired skin integrity will decrease Outcome: Progressing   Problem: Education: Goal: Verbalization of understanding the information provided (i.e., activity precautions, restrictions, etc) will improve Outcome: Progressing Goal: Individualized Educational Video(s) Outcome: Progressing   Problem: Activity: Goal: Ability to ambulate and perform ADLs will improve Outcome: Progressing   Problem: Clinical Measurements: Goal:  Postoperative complications will be avoided or minimized Outcome: Progressing   Problem: Self-Concept: Goal: Ability to maintain and perform role responsibilities to the fullest extent possible will improve Outcome: Progressing   Problem: Pain Management: Goal: Pain level will decrease Outcome: Progressing

## 2023-11-21 NOTE — TOC Initial Note (Addendum)
 Transition of Care [facility]) - Initial/Assessment Note    Patient Details  Name: Alexandra [name] MRN: [id number] Date of Birth: 10/16/1945  Transition [facility]) CM/SW Contact:    Alfonse JONELLE Rex, RN Phone Number: 11/21/2023, 9:46 AM  Clinical Narrative: Met with patient and patient's son at bedside to introduce role of TOC/NCM and review for dc planning, pt confirmed she resides alone, independent with self care and functional mobility prior to admission, no home DME, has good family support. PT eval pending, await recommendation.   12:45pm PT eval completed, recommendation for short term rehab/SNF, met with pt at bedside, pt agreeable, prefers [facility] in [place], [place] updated, Level 2 PASRR pending, faxed out for bed offers.                   Expected Discharge Plan: Skilled Nursing Facility Barriers to Discharge: Continued Medical Work up   Patient Goals and CMS Choice Patient states their goals for this hospitalization and ongoing recovery are:: return home          Expected Discharge Plan and Services       Living arrangements for the past 2 months: Single Family Home                                      Prior Living Arrangements/Services Living arrangements for the past 2 months: Single Family Home Lives with:: Self Patient language and need for interpreter reviewed:: Yes Do you feel safe going back to the place where you live?: Yes      Need for Family Participation in Patient Care: Yes (Comment) Care giver support system in place?: Yes (comment)   Criminal Activity/Legal Involvement Pertinent to Current Situation/Hospitalization: No - Comment as needed  Activities of Daily Living   ADL Screening (condition at time of admission) Independently performs ADLs?: Yes (appropriate for developmental age) Is the patient deaf or have difficulty hearing?: No Does the patient have difficulty seeing, even when wearing glasses/contacts?: Yes Does the  patient have difficulty concentrating, remembering, or making decisions?: No  Permission Sought/Granted                  Emotional Assessment Appearance:: Appears stated age Attitude/Demeanor/Rapport: Engaged Affect (typically observed): Accepting Orientation: : Oriented to Self, Oriented to Place, Oriented to  Time, Oriented to Situation Alcohol / Substance Use: Not Applicable Psych Involvement: No (comment)  Admission diagnosis:  Hip fracture (HCC) [S72.009A] Fall, initial encounter [W19.XXXA] Closed left hip fracture, initial encounter [facility]) [S72.002A] Patient Active Problem List   Diagnosis Date Noted   Hip fracture (HCC) 11/19/2023   Upper airway cough syndrome 05/25/2023   HLD (hyperlipidemia) 10/04/2022   OSA (obstructive sleep apnea) 10/04/2022   Encounter for therapeutic drug monitoring 03/29/2022   PAF (paroxysmal atrial fibrillation) (HCC) 03/16/2022   Unspecified atrial flutter (HCC) 03/16/2022   HTN (hypertension) 03/16/2022   Chest pain of uncertain etiology 03/16/2022   Elevated LFTs 10/10/2018   PCP:  Bertell Satterfield, MD Pharmacy:   [facility] - [place], [place] - [address] [place] [address] Phone: [phone] Fax: [phone]  [facility] - [place], [place] - [address] [place] [address] Phone: [phone] Fax: [phone]     Social Drivers of Health (SDOH) Social History: SDOH Screenings   Food Insecurity: Unknown (11/19/2023)  Housing: Low Risk  (  11/19/2023)  Transportation Needs: No Transportation Needs (11/19/2023)  Utilities: Not At Risk (11/19/2023)  Social Connections: Moderately Integrated (11/19/2023)  Tobacco Use: Low Risk  (11/18/2023)   SDOH Interventions:     Readmission Risk Interventions    11/21/2023    9:45 AM  Readmission Risk Prevention Plan  Transportation Screening Complete  PCP or Specialist Appt within 5-7 Days Complete  Home Care  Screening Complete  Medication Review (RN CM) Complete

## 2023-11-21 NOTE — Progress Notes (Signed)
 Subjective: 1 Day Post-Op Procedure(s) (LRB): INSERTION, INTRAMEDULLARY ROD, FEMUR (Left)  Patient doing well this morning. She normally lives independently and she would iek to get back to that. Family is aroudn but understands that SNF may be necessary.   Activity level:  partial WB left leg Diet tolerance:  ok Voiding:  ok Patient reports pain as mild.    Objective: Vital signs in last 24 hours: Temp:  [97.4 F (36.3 C)-98.9 F (37.2 C)] 98.5 F (36.9 C) (07/28 0529) Pulse Rate:  [67-81] 73 (07/28 0529) Resp:  [15-18] 15 (07/28 0529) BP: (108-148)/(59-86) 136/86 (07/28 0529) SpO2:  [96 %-100 %] 100 % (07/28 0529)  Labs: Recent Labs    11/18/23 2325 11/20/23 0333 11/20/23 1114 11/21/23 0345  HGB 11.5* 10.7* 10.6* 9.1*   Recent Labs    11/20/23 1114 11/21/23 0345  WBC 10.3 10.8*  RBC 3.46* 2.89*  HCT 32.6* 27.1*  PLT 210 196   Recent Labs    11/20/23 0333 11/20/23 1114 11/21/23 0345  NA 136  --  135  K 4.8  --  4.1  CL 103  --  103  CO2 24  --  24  BUN 14  --  13  CREATININE 0.75 0.87 0.84  GLUCOSE 96  --  139*  CALCIUM  8.8*  --  8.2*   Recent Labs    11/20/23 0333 11/21/23 0345  INR 1.2 1.3*    Physical Exam:  Neurologically intact ABD soft Neurovascular intact Sensation intact distally Intact pulses distally Dorsiflexion/Plantar flexion intact Incision: dressing C/D/I and no drainage No cellulitis present Compartment soft  Assessment/Plan:  1 Day Post-Op Procedure(s) (LRB): INSERTION, INTRAMEDULLARY ROD, FEMUR (Left) Advance diet Up with therapy Either D/C home or to SNF based on PT and medical team management.  Either continue on home coumadin  or switch to eliquis  if medical team and family agree.  Continue partial WB on left leg.  Follow up in office 2 weeks post op.    Prentice Mt Rashidi Loh 11/21/2023, 8:29 AM

## 2023-11-21 NOTE — TOC PASRR Note (Signed)
 30 Day PASRR Note   Patient Details  Name: Alexandra [name] Date of Birth: 03-Nov-1945   Transition of Care [facility]) CM/SW Contact:    Alfonse JONELLE Rex, RN Phone Number: 11/21/2023, 12:04 PM  To Whom It May Concern:  Please be advised that this patient will require a short-term nursing home stay - anticipated 30 days or less for rehabilitation and strengthening.   The plan is for return home.

## 2023-11-21 NOTE — Evaluation (Signed)
 Physical Therapy Evaluation Patient Details Name: Alexandra [name] MRN: [id number] DOB: 01-11-1946 Today's Date: 11/21/2023  History of Present Illness  78 y.o. female who presented after tripping on a rug at home landing on her left side and sustained Left hip intertrochanteric fracture.  Pt s/p Left hip trochanteric nail on 11/20/23. Past medical history significant of A-fib on warfarin, depression, gout, hypertension, hyperlipidemia  Clinical Impression  Patient is s/p above surgery resulting in functional limitations due to the deficits listed below (see PT Problem List).  Patient will benefit from acute skilled PT to increase their independence and safety with mobility to facilitate discharge.  Pt very pleasant and motivated.  Pt reports living alone and very independent prior to admission.  Pt's son, Selinda, present and reports family available however intermittently so pt would be alone at times if discharging home.   Pt hoping to rehab as much as possible to return to previous independence as soon as possible and agreeable to SNF.  Pt has not used DME before, now has new PWB status and currently requires physical assist for mobility.  Patient will benefit from continued inpatient follow up therapy, <3 hours/day.          If plan is discharge home, recommend the following: A lot of help with walking and/or transfers;A lot of help with bathing/dressing/bathroom;Assistance with cooking/housework;Assist for transportation;Help with stairs or ramp for entrance   Can travel by private vehicle        Equipment Recommendations Rolling walker (2 wheels);BSC/3in1  Recommendations for Other Services       Functional Status Assessment Patient has had a recent decline in their functional status and demonstrates the ability to make significant improvements in function in a reasonable and predictable amount of time.     Precautions / Restrictions Precautions Precautions: Fall Restrictions Weight  Bearing Restrictions Per Provider Order: Yes LLE Weight Bearing Per Provider Order: Partial weight bearing LLE Partial Weight Bearing Percentage or Pounds: 50%      Mobility  Bed Mobility Overal bed mobility: Needs Assistance Bed Mobility: Supine to Sit     Supine to sit: Mod assist, HOB elevated, Used rails     General bed mobility comments: multimodal cues for self assist; assist for L LE over EOB and trunk upright    Transfers Overall transfer level: Needs assistance Equipment used: Rolling walker (2 wheels) Transfers: Sit to/from Stand Sit to Stand: Min assist           General transfer comment: verbal cues for UE and LE positioning for pain control and maintaining PWB status, assist to rise and stabilize    Ambulation/Gait Ambulation/Gait assistance: Min assist Gait Distance (Feet): 6 Feet Assistive device: Rolling walker (2 wheels) Gait Pattern/deviations: Step-to pattern, Antalgic, Decreased stance time - left Gait velocity: decr     General Gait Details: verbal cues for sequence, RW positioning, step length, posture; cues for use of UEs through RW for PWB status; pt reports arms fatigued quickly therefore short distance; recliner provided for safety  Stairs            Wheelchair Mobility     Tilt Bed    Modified Rankin (Stroke Patients Only)       Balance Overall balance assessment: History of Falls  Pertinent Vitals/Pain Pain Assessment Pain Assessment: 0-10 Pain Score: 4  Pain Location: left hip/thigh Pain Descriptors / Indicators: Sore, Aching Pain Intervention(s): Repositioned, Monitored during session, Premedicated before session    Home Living Family/patient expects to be discharged to:: Private residence Living Arrangements: Alone Available Help at Discharge: Family;Available PRN/intermittently Type of Home: House         Home Layout: One level Home Equipment:  None      Prior Function Prior Level of Function : Independent/Modified Independent                     Extremity/Trunk Assessment   Upper Extremity Assessment Upper Extremity Assessment: Generalized weakness    Lower Extremity Assessment Lower Extremity Assessment: Generalized weakness;LLE deficits/detail LLE Deficits / Details: anticipated post op hip weakness, able to perform ankle pumps       Communication   Communication Communication: No apparent difficulties    Cognition Arousal: Alert Behavior During Therapy: WFL for tasks assessed/performed   PT - Cognitive impairments: No apparent impairments                         Following commands: Intact       Cueing       General Comments      Exercises     Assessment/Plan    PT Assessment Patient needs continued PT services  PT Problem List Decreased strength;Decreased activity tolerance;Decreased knowledge of use of DME;Pain;Decreased knowledge of precautions;Decreased mobility;Decreased balance       PT Treatment Interventions Gait training;DME instruction;Balance training;Functional mobility training;Therapeutic activities;Therapeutic exercise;Patient/family education    PT Goals (Current goals can be found in the Care Plan section)  Acute Rehab PT Goals PT Goal Formulation: With patient/family Time For Goal Achievement: 12/05/23 Potential to Achieve Goals: Good    Frequency Min 3X/week     Co-evaluation               AM-PAC PT 6 Clicks Mobility  Outcome Measure Help needed turning from your back to your side while in a flat bed without using bedrails?: A Lot Help needed moving from lying on your back to sitting on the side of a flat bed without using bedrails?: A Lot Help needed moving to and from a bed to a chair (including a wheelchair)?: A Lot Help needed standing up from a chair using your arms (e.g., wheelchair or bedside chair)?: A Lot Help needed to walk in  hospital room?: A Lot Help needed climbing 3-5 steps with a railing? : Total 6 Click Score: 11    End of Session Equipment Utilized During Treatment: Gait belt Activity Tolerance: Patient tolerated treatment well Patient left: in chair;with chair alarm set;with family/visitor present;with call bell/phone within reach Nurse Communication: Mobility status PT Visit Diagnosis: Difficulty in walking, not elsewhere classified (R26.2)    Time: 1000-1027 PT Time Calculation (min) (ACUTE ONLY): 27 min   Charges:   PT Evaluation $PT Eval Low Complexity: 1 Low PT Treatments $Gait Training: 8-22 mins PT General Charges $$ ACUTE PT VISIT: 1 Visit        Tari PT, DPT Physical Therapist Acute Rehabilitation Services Office: [phone]   Tari CROME [place] 11/21/2023, 12:40 PM

## 2023-11-21 NOTE — Progress Notes (Signed)
  Progress Note   Patient: Alexandra [name] FMW:[id number] DOB: 26-Jan-1946 DOA: 11/18/2023     2 DOS: the patient was seen and examined on 11/21/2023   Brief hospital course: 78 y.o. female with medical history significant of A-fib on warfarin, depression, gout, hypertension, hyperlipidemia who presented after tripping on a rug at home landing on her left side.  She developed severe pain.  She went to the emergency department where she was found to be febrile hemodynamically stable.  Labs were obtained which demonstrated sodium 2.9, creatinine 1.15.  INR 1.9.  Patient underwent chest x-ray which showed no acute findings.  X-ray of hip showed left femoral neck fracture.   CT spine showed no fracture.  CT head showed no acute abnormalities.  There was a thyroid  nodule recommended outpatient follow-up.   Orthopedics was still recommend transfer to [facility].  Patient was admitted for further workup.  She was given IV vitamin K  to reverse her therapeutic INR.  Assessment and Plan: Left hip femoral neck fracture - Patient mechanical fall on rug - Angulated fracture with need for surgical intervention -preoperatively, warfarin was reversed with vitamin K  -Orthopedic surgery following and pt is now s/p surgery 7/27.  -Therapy recs for SNF. TOC following   Paroxysmal A-fib-patient currently not in A-fib. On Diltiazem   Continue beta-blocker -Coumadin  was reversed prior to surgery, would resume anticoag when OK with surgery -Discussed with patient. Pt chose to be on coumadin  purely because of cost reasons, discussed with pharmacy who confirmed $0.00 copay with eliquis , will transition to eliquis  -Ok to resume anticoag per Ortho   Hypertension- held losartan  -cont cardizem  and BB   Hyperlipidemia-continue atorvastatin    Depression-continue Lexapro    Mood disorder-continue Lamictal    Chronic anticoagulation -transition to eliquis  today      Subjective: No complaints  Physical  Exam: Vitals:   11/20/23 1857 11/20/23 2143 11/21/23 0140 11/21/23 0529  BP: 108/66 122/66 (!) 120/59 136/86  Pulse: 70 78 81 73  Resp: 18 15 16 15   Temp: 98.9 F (37.2 C) 98.1 F (36.7 C) 97.6 F (36.4 C) 98.5 F (36.9 C)  TempSrc: Oral Oral  Oral  SpO2: 96% 99% 97% 100%  Weight:      Height:       General exam: Conversant, in no acute distress Respiratory system: normal chest rise, clear, no audible wheezing Cardiovascular system: regular rhythm, s1-s2 Gastrointestinal system: Nondistended, nontender, pos BS Central nervous system: No seizures, no tremors Extremities: No cyanosis, no joint deformities Skin: No rashes, no pallor Psychiatry: Affect normal // no auditory hallucinations   Data Reviewed:  Labs reviewed: Na 135, K 4.1, Cr 0.84, Hgb 9.1  Family Communication: Pt in room, family at bedside  Disposition: Status is: Inpatient Remains inpatient appropriate because: severity of illness  Planned Discharge Destination: Skilled nursing facility     Author: Garnette Pelt, MD 11/21/2023 1:58 PM  For on call review [email or web address].

## 2023-11-21 NOTE — Progress Notes (Signed)
 Physical Therapy Treatment Patient Details Name: Alexandra [name] MRN: [id number] DOB: 02/08/1946 Today's Date: 11/21/2023   History of Present Illness 78 y.o. female who presented after tripping on a rug at home landing on her left side and sustained Left hip intertrochanteric fracture.  Pt s/p Left hip trochanteric nail on 11/20/23. Past medical history significant of A-fib on warfarin, depression, gout, hypertension, hyperlipidemia    PT Comments  Pt requesting assist to [facility].  Pt able to ambulate a few feet to [facility] and then over to bed.  Pt does report pain however states UE fatigue and soreness more limiting for mobility at this time.  Pt assisted safely back to supine and ice packs provided.   Current d/c plan remains appropriate.  Patient will benefit from continued inpatient follow up therapy, <3 hours/day.    If plan is discharge home, recommend the following: A lot of help with walking and/or transfers;A lot of help with bathing/dressing/bathroom;Assistance with cooking/housework;Assist for transportation;Help with stairs or ramp for entrance   Can travel by private vehicle        Equipment Recommendations       Recommendations for Other Services       Precautions / Restrictions Precautions Precautions: Fall Restrictions Weight Bearing Restrictions Per Provider Order: Yes LLE Weight Bearing Per Provider Order: Partial weight bearing LLE Partial Weight Bearing Percentage or Pounds: 50%     Mobility  Bed Mobility Overal bed mobility: Needs Assistance Bed Mobility: Sit to Supine     Supine to sit: Mod assist, HOB elevated, Used rails Sit to supine: Min assist, Used rails   General bed mobility comments: multimodal cues for self assist; assist for L LE    Transfers Overall transfer level: Needs assistance Equipment used: Rolling walker (2 wheels) Transfers: Sit to/from Stand Sit to Stand: Min assist           General transfer comment: verbal cues for UE and LE  positioning for pain control and maintaining PWB status, assist to rise and stabilize    Ambulation/Gait Ambulation/Gait assistance: Min assist, Mod assist Gait Distance (Feet): 6 Feet Assistive device: Rolling walker (2 wheels) Gait Pattern/deviations: Step-to pattern, Antalgic, Decreased stance time - left Gait velocity: decr     General Gait Details: verbal cues for sequence, RW positioning, step length, posture; cues for use of UEs through RW for PWB status; difficulty advancing L LE so occasional physical assist provided; performed a few feet to [facility] and then over to bed   Stairs             Wheelchair Mobility     Tilt Bed    Modified Rankin (Stroke Patients Only)       Balance Overall balance assessment: History of Falls                                          Communication Communication Communication: No apparent difficulties  Cognition Arousal: Alert Behavior During Therapy: WFL for tasks assessed/performed   PT - Cognitive impairments: No apparent impairments                         Following commands: Intact      Cueing    Exercises      General Comments        Pertinent Vitals/Pain Pain Assessment Pain Assessment: 0-10 Pain Score: 6  Pain Location: left hip/thigh Pain Descriptors / Indicators: Sore, Aching Pain Intervention(s): Monitored during session, Repositioned, Ice applied    Home Living Family/patient expects to be discharged to:: Private residence Living Arrangements: Alone Available Help at Discharge: Family;Available PRN/intermittently Type of Home: House         Home Layout: One level Home Equipment: None      Prior Function            PT Goals (current goals can now be found in the care plan section) Progress towards PT goals: Progressing toward goals    Frequency    Min 3X/week      PT Plan      Co-evaluation              AM-PAC PT 6 Clicks Mobility   Outcome  Measure  Help needed turning from your back to your side while in a flat bed without using bedrails?: A Lot Help needed moving from lying on your back to sitting on the side of a flat bed without using bedrails?: A Lot Help needed moving to and from a bed to a chair (including a wheelchair)?: A Lot Help needed standing up from a chair using your arms (e.g., wheelchair or bedside chair)?: A Lot Help needed to walk in hospital room?: A Lot Help needed climbing 3-5 steps with a railing? : Total 6 Click Score: 11    End of Session Equipment Utilized During Treatment: Gait belt Activity Tolerance: Patient tolerated treatment well Patient left: with call bell/phone within reach;with bed alarm set;with SCD's reapplied;in bed Nurse Communication: Mobility status PT Visit Diagnosis: Difficulty in walking, not elsewhere classified (R26.2)     Time: 8476-8447 PT Time Calculation (min) (ACUTE ONLY): 29 min  Charges:    $Therapeutic Activity: 23-37 mins PT General Charges $$ ACUTE PT VISIT: 1 Visit                    Tari KLEIN, DPT Physical Therapist Acute Rehabilitation Services Office: [phone]   Tari CROME Payson 11/21/2023, 4:10 PM

## 2023-11-21 NOTE — NC FL2 (Addendum)
 [place]  MEDICAID FL2 LEVEL OF CARE FORM     IDENTIFICATION  Patient Name: Alexandra [name] Birthdate: [birth year] Sex: female Admission Date (Current Location): 11/18/2023  [facility] and [place] Number:  Producer, television/film/video and Address:  [facility],  [address] [place] [address], [place] [address]      Provider Number: [id number]  Attending Physician Name and Address:  Cindy Garnette POUR, MD  Relative Name and Phone Number:  ward,page (Granddaughter)  [phone] [facility])    Current Level of Care: Hospital Recommended Level of Care: Skilled Nursing Facility Prior Approval Number:    Date Approved/Denied:   PASRR Number: [id number]  Discharge Plan:      Current Diagnoses: Patient Active Problem List   Diagnosis Date Noted   Hip fracture (HCC) 11/19/2023   Upper airway cough syndrome 05/25/2023   HLD (hyperlipidemia) 10/04/2022   OSA (obstructive sleep apnea) 10/04/2022   Encounter for therapeutic drug monitoring 03/29/2022   PAF (paroxysmal atrial fibrillation) (HCC) 03/16/2022   Unspecified atrial flutter (HCC) 03/16/2022   HTN (hypertension) 03/16/2022   Chest pain of uncertain etiology 03/16/2022   Elevated LFTs 10/10/2018    Orientation RESPIRATION BLADDER Height & Weight     Self, Time, Situation, Place  Normal Continent, External catheter Weight: 78 kg Height:  5' 1 (154.9 cm)  BEHAVIORAL SYMPTOMS/MOOD NEUROLOGICAL BOWEL NUTRITION STATUS      Continent Diet (carb modified)  AMBULATORY STATUS COMMUNICATION OF NEEDS Skin   Limited Assist Verbally Surgical wounds (Left Total Hip Arthroplasty)                       Personal Care Assistance Level of Assistance  Bathing, Feeding, Dressing Bathing Assistance: Limited assistance Feeding assistance: Limited assistance Dressing Assistance: Limited assistance     Functional Limitations Info  Sight, Hearing, Speech Sight Info: Impaired (eyeglasses) Hearing Info: Adequate Speech Info: Adequate     SPECIAL CARE FACTORS FREQUENCY  PT (By licensed PT), OT (By licensed OT)     PT Frequency: 5x/wk OT Frequency: 5x/wk            Contractures Contractures Info: Not present    Additional Factors Info  Code Status, Allergies, Psychotropic Code Status Info: Full Code Allergies Info: Codeine, Naproxen Psychotropic Info: Lexapro  20mg  po daily         Current Medications (11/21/2023):  This is the current hospital active medication list Current Facility-Administered Medications  Medication Dose Route Frequency Provider Last Rate Last Admin   acetaminophen  (TYLENOL ) tablet 325-650 mg  325-650 mg Oral Q6H PRN Phillips, Eric K, PA-C       apixaban  (ELIQUIS ) tablet 5 mg  5 mg Oral BID Carolee Browning T, RPH       bisacodyl  (DULCOLAX) EC tablet 5 mg  5 mg Oral Daily PRN Phillips, Eric K, PA-C       dextrose  5 % and 0.45 % NaCl with KCl 20 mEq/L infusion   Intravenous Continuous [name], PA-C 10 mL/hr at 11/21/23 0548 Rate Change at 11/21/23 0548   docusate sodium  (COLACE) capsule 100 mg  100 mg Oral BID Phillips, Eric K, PA-C   100 mg at 11/21/23 9078   escitalopram  (LEXAPRO ) tablet 20 mg  20 mg Oral Daily Phillips, Eric K, PA-C   20 mg at 11/21/23 9078   hydrochlorothiazide  (HYDRODIURIL ) tablet 25 mg  25 mg Oral Daily Phillips, Eric K, PA-C   25 mg at 11/21/23 9078   HYDROmorphone  (DILAUDID ) injection 0.5-1 mg  0.5-1 mg Intravenous Q4H PRN Phillips, Eric K, PA-C       lamoTRIgine  (LAMICTAL ) tablet 150 mg  150 mg Oral Daily Phillips, Eric K, PA-C   150 mg at 11/21/23 9079   menthol -cetylpyridinium (CEPACOL) lozenge 3 mg  1 lozenge Oral PRN Phillips, Eric K, PA-C       Or   phenol (CHLORASEPTIC) mouth spray 1 spray  1 spray Mouth/Throat PRN [name], PA-C       methocarbamol  (ROBAXIN ) tablet 500 mg  500 mg Oral Q6H PRN Phillips, Eric K, PA-C   500 mg at 11/21/23 9453   Or   methocarbamol  (ROBAXIN ) injection 500 mg  500 mg Intravenous Q6H PRN Phillips, Eric K, PA-C    500 mg at 11/20/23 1010   metoCLOPramide  (REGLAN ) tablet 5-10 mg  5-10 mg Oral Q8H PRN Phillips, Eric K, PA-C       Or   metoCLOPramide  (REGLAN ) injection 5-10 mg  5-10 mg Intravenous Q8H PRN Phillips, Eric K, PA-C       metoprolol  tartrate (LOPRESSOR ) tablet 50 mg  50 mg Oral BID Phillips, Eric K, PA-C   50 mg at 11/21/23 9078   ondansetron  (ZOFRAN ) tablet 4 mg  4 mg Oral Q6H PRN Phillips, Eric K, PA-C       Or   ondansetron  (ZOFRAN ) injection 4 mg  4 mg Intravenous Q6H PRN Phillips, Eric K, PA-C       oxyCODONE  (Oxy IR/ROXICODONE ) immediate release tablet 10-15 mg  10-15 mg Oral Q4H PRN Phillips, Eric K, PA-C       oxyCODONE  (Oxy IR/ROXICODONE ) immediate release tablet 5-10 mg  5-10 mg Oral Q4H PRN Phillips, Eric K, PA-C   10 mg at 11/21/23 0930   pantoprazole  (PROTONIX ) EC tablet 40 mg  40 mg Oral Daily Phillips, Eric K, PA-C   40 mg at 11/21/23 9078   senna-docusate (Senokot-S) tablet 1 tablet  1 tablet Oral QHS PRN Phillips, Eric K, PA-C       simethicone  (MYLICON) 40 MG/0.6ML suspension 40 mg  40 mg Oral QID PRN Phillips, Eric K, PA-C   40 mg at 11/19/23 1747   sodium phosphate  (FLEET) enema 1 enema  1 enema Rectal Once PRN Phillips, Eric K, PA-C       traMADol  (ULTRAM ) tablet 50 mg  50 mg Oral Q6H Phillips, Eric K, PA-C   50 mg at 11/21/23 1216     Discharge Medications: Please see discharge summary for a list of discharge medications.  Relevant Imaging Results:  Relevant Lab Results:   Additional Information SSN: [id number]  Alexandra JONELLE Rex, RN

## 2023-11-21 NOTE — Discharge Instructions (Signed)

## 2023-11-22 DIAGNOSIS — W19XXXA Unspecified fall, initial encounter: Secondary | ICD-10-CM | POA: Diagnosis not present

## 2023-11-22 DIAGNOSIS — S72002A Fracture of unspecified part of neck of left femur, initial encounter for closed fracture: Secondary | ICD-10-CM | POA: Diagnosis not present

## 2023-11-22 NOTE — Plan of Care (Signed)
  Problem: Education: Goal: Knowledge of General Education information will improve Description: Including pain rating scale, medication(s)/side effects and non-pharmacologic comfort measures Outcome: Progressing   Problem: Health Behavior/Discharge Planning: Goal: Ability to manage health-related needs will improve Outcome: Progressing   Problem: Clinical Measurements: Goal: Ability to maintain clinical measurements within normal limits will improve Outcome: Progressing Goal: Will remain free from infection Outcome: Progressing Goal: Diagnostic test results will improve Outcome: Progressing Goal: Respiratory complications will improve Outcome: Adequate for Discharge Goal: Cardiovascular complication will be avoided Outcome: Adequate for Discharge   Problem: Activity: Goal: Risk for activity intolerance will decrease Outcome: Adequate for Discharge   Problem: Nutrition: Goal: Adequate nutrition will be maintained Outcome: Completed/Met   Problem: Coping: Goal: Level of anxiety will decrease Outcome: Progressing   Problem: Elimination: Goal: Will not experience complications related to bowel motility Outcome: Progressing   Problem: Pain Managment: Goal: General experience of comfort will improve and/or be controlled Outcome: Adequate for Discharge   Problem: Safety: Goal: Ability to remain free from injury will improve Outcome: Adequate for Discharge   Problem: Skin Integrity: Goal: Risk for impaired skin integrity will decrease Outcome: Completed/Met   Problem: Education: Goal: Verbalization of understanding the information provided (i.e., activity precautions, restrictions, etc) will improve Outcome: Progressing Goal: Individualized Educational Video(s) Outcome: Completed/Met   Problem: Activity: Goal: Ability to ambulate and perform ADLs will improve Outcome: Adequate for Discharge   Problem: Clinical Measurements: Goal: Postoperative complications will  be avoided or minimized Outcome: Progressing   Problem: Self-Concept: Goal: Ability to maintain and perform role responsibilities to the fullest extent possible will improve Outcome: Progressing   Problem: Pain Management: Goal: Pain level will decrease Outcome: Adequate for Discharge

## 2023-11-22 NOTE — TOC Progression Note (Addendum)
 Transition of Care [facility]) - Progression Note    Patient Details  Name: DNASIA GAUNA MRN: [id number] Date of Birth: [birth year]  [facility] CM/SW Contact  Alfonse JONELLE Rex, RN Phone Number: 11/22/2023, 11:28 AM  Clinical Narrative:   Met with patient at bedside to review short term rehab/SNF bed offers [facility], [address], [address], [place], [place], [place], [place], [place]), list left at bedside for patient to review with family. NCM called to [facility], rep-Kerri, admit coordinator, vm left with NCM name and phone number requesting review for bed offer, await call back.   3;10pm Met with patient at bedside, reviewed additional SNF bed offer ( [facility]. Updated patient that no bed offer at this time from [facility]. Patient request NCM send request  for bed offer to [facility], request sent in SNF HUB, text sent to Kristin, admit coord w/[facility] with request for review.     Expected Discharge Plan: Skilled Nursing Facility Barriers to Discharge: Continued Medical Work up               Expected Discharge Plan and Services       Living arrangements for the past 2 months: Single Family Home                                       Social Drivers of Health (SDOH) Interventions SDOH Screenings   Food Insecurity: Unknown (11/19/2023)  Housing: Low Risk  (11/19/2023)  Transportation Needs: No Transportation Needs (11/19/2023)  Utilities: Not At Risk (11/19/2023)  Social Connections: Moderately Integrated (11/19/2023)  Tobacco Use: Low Risk  (11/18/2023)    Readmission Risk Interventions    11/21/2023    9:45 AM  Readmission Risk Prevention Plan  Transportation Screening Complete  PCP or Specialist Appt within 5-7 Days Complete  Home Care Screening Complete  Medication Review (RN CM) Complete
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
## 2023-11-22 NOTE — Progress Notes (Signed)
  Progress Note   Patient: Alexandra [name] FMW:[id number] DOB: 1946-03-05 DOA: 11/18/2023     3 DOS: the patient was seen and examined on 11/22/2023   Brief hospital course: 78 y.o. female with medical history significant of A-fib on warfarin, depression, gout, hypertension, hyperlipidemia who presented after tripping on a rug at home landing on her left side.  She developed severe pain.  She went to the emergency department where she was found to be febrile hemodynamically stable.  Labs were obtained which demonstrated sodium 2.9, creatinine 1.15.  INR 1.9.  Patient underwent chest x-ray which showed no acute findings.  X-ray of hip showed left femoral neck fracture.   CT spine showed no fracture.  CT head showed no acute abnormalities.  There was a thyroid  nodule recommended outpatient follow-up.   Orthopedics was still recommend transfer to [facility].  Patient was admitted for further workup.  She was given IV vitamin K  to reverse her therapeutic INR.  Assessment and Plan: Left hip femoral neck fracture - Patient mechanical fall on rug - Angulated fracture with need for surgical intervention -preoperatively, warfarin was reversed with vitamin K  -Orthopedic surgery following and pt is now s/p surgery 7/27.  -Therapy recs for SNF. TOC is following    Paroxysmal A-fib-patient currently not in A-fib. Continue beta-blocker -Coumadin  was reversed prior to surgery, would resume anticoag when OK with surgery -Pt had previously been on coumadin  purely because of cost reasons, discussed with pharmacy who confirmed $0.00 copay with eliquis  -Now on eliquis    Hypertension- held losartan  -cont BB   Hyperlipidemia-continue atorvastatin    Depression-continue Lexapro    Mood disorder-continue Lamictal    Chronic anticoagulation -on eliquis       Subjective: Without complaints today  Physical Exam: Vitals:   11/21/23 1402 11/21/23 2137 11/22/23 0638 11/22/23 1357  BP: (!) 105/58 124/64  116/71 121/78  Pulse: 62 73 76 81  Resp: 18 16 15 16   Temp: 97.9 F (36.6 C) 98.3 F (36.8 C) 98.5 F (36.9 C) 98.7 F (37.1 C)  TempSrc: Oral   Oral  SpO2: 91% 99% 97% 97%  Weight:      Height:       General exam: Awake, laying in bed, in nad Respiratory system: Normal respiratory effort, no wheezing Cardiovascular system: regular rate, s1, s2 Gastrointestinal system: Soft, nondistended, positive BS Central nervous system: CN2-12 grossly intact, strength intact Extremities: Perfused, no clubbing Skin: Normal skin turgor, no notable skin lesions seen Psychiatry: Mood normal // no visual hallucinations   Data Reviewed:  There are no new results to review at this time.  Family Communication: Pt in room, family not at bedside  Disposition: Status is: Inpatient Remains inpatient appropriate because: severity of illness  Planned Discharge Destination: Skilled nursing facility     Author: Garnette Pelt, MD 11/22/2023 5:16 PM  For on call review [email or web address].

## 2023-11-22 NOTE — Plan of Care (Signed)
  Problem: Pain Management: Goal: Pain level will decrease Outcome: Progressing   Problem: Pain Management: Goal: Pain level will decrease Outcome: Progressing

## 2023-11-22 NOTE — Progress Notes (Signed)
 Subjective: 2 Days Post-Op Procedure(s) (LRB): INSERTION, INTRAMEDULLARY ROD, FEMUR (Left)  Patient feeling a little better this morning. She is in less pain. She is also glad to be switching to eliquis  and off of her coumadin .  Activity level:  partial WB left leg Diet tolerance:  ok Voiding:  ok Patient reports pain as mild.    Objective: Vital signs in last 24 hours: Temp:  [97.9 F (36.6 C)-98.5 F (36.9 C)] 98.5 F (36.9 C) (07/29 9361) Pulse Rate:  [62-76] 76 (07/29 0638) Resp:  [15-18] 15 (07/29 9361) BP: (105-124)/(58-71) 116/71 (07/29 9361) SpO2:  [91 %-99 %] 97 % (07/29 0638)  Labs: Recent Labs    11/20/23 0333 11/20/23 1114 11/21/23 0345  HGB 10.7* 10.6* 9.1*   Recent Labs    11/20/23 1114 11/21/23 0345  WBC 10.3 10.8*  RBC 3.46* 2.89*  HCT 32.6* 27.1*  PLT 210 196   Recent Labs    11/20/23 0333 11/20/23 1114 11/21/23 0345  NA 136  --  135  K 4.8  --  4.1  CL 103  --  103  CO2 24  --  24  BUN 14  --  13  CREATININE 0.75 0.87 0.84  GLUCOSE 96  --  139*  CALCIUM  8.8*  --  8.2*   Recent Labs    11/20/23 0333 11/21/23 0345  INR 1.2 1.3*    Physical Exam:  Neurologically intact ABD soft Neurovascular intact Sensation intact distally Intact pulses distally Dorsiflexion/Plantar flexion intact Incision: dressing C/D/I and no drainage No cellulitis present Compartment soft  Assessment/Plan:  2 Days Post-Op Procedure(s) (LRB): INSERTION, INTRAMEDULLARY ROD, FEMUR (Left) Advance diet Up with therapy We greatly appreciate medical management.  Follow up in office 2 weeks post op. She is cleared from ortho standpoint once cleared by medicine team and PT. Continue on eliquis  for her a-fib and for post op DVT prevention.   Prentice Mt Everline Mahaffy 11/22/2023, 7:02 AM

## 2023-11-23 DIAGNOSIS — F39 Unspecified mood [affective] disorder: Secondary | ICD-10-CM | POA: Diagnosis not present

## 2023-11-23 DIAGNOSIS — R2681 Unsteadiness on feet: Secondary | ICD-10-CM | POA: Diagnosis not present

## 2023-11-23 DIAGNOSIS — F3340 Major depressive disorder, recurrent, in remission, unspecified: Secondary | ICD-10-CM | POA: Diagnosis not present

## 2023-11-23 DIAGNOSIS — M25552 Pain in left hip: Secondary | ICD-10-CM | POA: Diagnosis not present

## 2023-11-23 DIAGNOSIS — S72002A Fracture of unspecified part of neck of left femur, initial encounter for closed fracture: Secondary | ICD-10-CM | POA: Diagnosis not present

## 2023-11-23 DIAGNOSIS — E871 Hypo-osmolality and hyponatremia: Secondary | ICD-10-CM | POA: Diagnosis not present

## 2023-11-23 DIAGNOSIS — F3341 Major depressive disorder, recurrent, in partial remission: Secondary | ICD-10-CM | POA: Diagnosis not present

## 2023-11-23 DIAGNOSIS — D84821 Immunodeficiency due to drugs: Secondary | ICD-10-CM | POA: Diagnosis not present

## 2023-11-23 DIAGNOSIS — I119 Hypertensive heart disease without heart failure: Secondary | ICD-10-CM | POA: Diagnosis not present

## 2023-11-23 DIAGNOSIS — S72009A Fracture of unspecified part of neck of unspecified femur, initial encounter for closed fracture: Secondary | ICD-10-CM | POA: Diagnosis not present

## 2023-11-23 DIAGNOSIS — M6281 Muscle weakness (generalized): Secondary | ICD-10-CM | POA: Diagnosis not present

## 2023-11-23 DIAGNOSIS — G894 Chronic pain syndrome: Secondary | ICD-10-CM | POA: Diagnosis not present

## 2023-11-23 DIAGNOSIS — Z79899 Other long term (current) drug therapy: Secondary | ICD-10-CM | POA: Diagnosis not present

## 2023-11-23 DIAGNOSIS — I10 Essential (primary) hypertension: Secondary | ICD-10-CM | POA: Diagnosis not present

## 2023-11-23 DIAGNOSIS — S72002D Fracture of unspecified part of neck of left femur, subsequent encounter for closed fracture with routine healing: Secondary | ICD-10-CM | POA: Diagnosis not present

## 2023-11-23 DIAGNOSIS — I480 Paroxysmal atrial fibrillation: Secondary | ICD-10-CM | POA: Diagnosis not present

## 2023-11-23 DIAGNOSIS — M25562 Pain in left knee: Secondary | ICD-10-CM | POA: Diagnosis not present

## 2023-11-23 DIAGNOSIS — R2689 Other abnormalities of gait and mobility: Secondary | ICD-10-CM | POA: Diagnosis not present

## 2023-11-23 DIAGNOSIS — Z7901 Long term (current) use of anticoagulants: Secondary | ICD-10-CM | POA: Diagnosis not present

## 2023-11-23 DIAGNOSIS — Z9181 History of falling: Secondary | ICD-10-CM | POA: Diagnosis not present

## 2023-11-23 DIAGNOSIS — F32A Depression, unspecified: Secondary | ICD-10-CM | POA: Diagnosis not present

## 2023-11-23 DIAGNOSIS — Z7401 Bed confinement status: Secondary | ICD-10-CM | POA: Diagnosis not present

## 2023-11-23 DIAGNOSIS — I4891 Unspecified atrial fibrillation: Secondary | ICD-10-CM | POA: Diagnosis not present

## 2023-11-23 DIAGNOSIS — M545 Low back pain, unspecified: Secondary | ICD-10-CM | POA: Diagnosis not present

## 2023-11-23 DIAGNOSIS — M109 Gout, unspecified: Secondary | ICD-10-CM | POA: Diagnosis not present

## 2023-11-23 DIAGNOSIS — S72009D Fracture of unspecified part of neck of unspecified femur, subsequent encounter for closed fracture with routine healing: Secondary | ICD-10-CM | POA: Diagnosis not present

## 2023-11-23 LAB — CBC
HCT: 29.5 % — ABNORMAL LOW (ref 36.0–46.0)
Hemoglobin: 9.6 g/dL — ABNORMAL LOW (ref 12.0–15.0)
MCH: 30.2 pg (ref 26.0–34.0)
MCHC: 32.5 g/dL (ref 30.0–36.0)
MCV: 92.8 fL (ref 80.0–100.0)
Platelets: 243 K/uL (ref 150–400)
RBC: 3.18 MIL/uL — ABNORMAL LOW (ref 3.87–5.11)
RDW: 13.7 % (ref 11.5–15.5)
WBC: 8.7 K/uL (ref 4.0–10.5)
nRBC: 0 % (ref 0.0–0.2)

## 2023-11-23 LAB — COMPREHENSIVE METABOLIC PANEL WITH GFR
ALT: 14 U/L (ref 0–44)
AST: 23 U/L (ref 15–41)
Albumin: 2.6 g/dL — ABNORMAL LOW (ref 3.5–5.0)
Alkaline Phosphatase: 61 U/L (ref 38–126)
Anion gap: 8 (ref 5–15)
BUN: 15 mg/dL (ref 8–23)
CO2: 27 mmol/L (ref 22–32)
Calcium: 8.5 mg/dL — ABNORMAL LOW (ref 8.9–10.3)
Chloride: 93 mmol/L — ABNORMAL LOW (ref 98–111)
Creatinine, Ser: 0.92 mg/dL (ref 0.44–1.00)
GFR, Estimated: >60 mL/min (ref >60)
Glucose, Bld: 99 mg/dL (ref 70–99)
Potassium: 4.3 mmol/L (ref 3.5–5.1)
Sodium: 128 mmol/L — ABNORMAL LOW (ref 135–145)
Total Bilirubin: 1.2 mg/dL (ref 0.0–1.2)
Total Protein: 6.1 g/dL — ABNORMAL LOW (ref 6.5–8.1)

## 2023-11-23 LAB — SODIUM: Sodium: 130 mmol/L — ABNORMAL LOW (ref 135–145)

## 2023-11-23 MED ORDER — SODIUM CHLORIDE 0.9 % IV SOLN: INTRAVENOUS | Status: DC

## 2023-11-23 MED ORDER — APIXABAN 5 MG PO TABS: 5.0000 mg | ORAL_TABLET | Freq: Two times a day (BID) | ORAL | 0 refills | Status: DC

## 2023-11-23 NOTE — Progress Notes (Addendum)
 PROGRESS NOTE    Alexandra [name]  FMW:[id number] DOB: Aug 05, 1945 DOA: 11/18/2023 PCP: Bertell Satterfield, MD   Brief Narrative:  78 y.o. female with medical history significant of A-fib on warfarin, depression, gout, hypertension, hyperlipidemia who presented after tripping on a rug at home landing on her left side.  She developed severe pain.  She went to the emergency department where she was found to be febrile hemodynamically stable.  Labs were obtained which demonstrated sodium 2.9, creatinine 1.15.  INR 1.9.  Patient underwent chest x-ray which showed no acute findings.  X-ray of hip showed left femoral neck fracture.   CT spine showed no fracture.  CT head showed no acute abnormalities.  There was a thyroid  nodule recommended outpatient follow-up.   Orthopedics was still recommend transfer to [facility].  Patient was admitted for further workup.  She was given IV vitamin K  to reverse her therapeutic INR.  Assessment & Plan:   Principal Problem:   Hip fracture (HCC)  Left hip femoral neck fracture status post mechanical fall on the rug, POA: Status post surgical repair 11/20/2023 by orthopedics.  PT OT recommends SNF.  Pending placement at the moment.   Paroxysmal A-fib-patient currently not in A-fib. -Coumadin  was reversed prior to surgery, now on Eliquis .  Rate is controlled, continue beta-blocker.   Hypertension- held losartan  -cont BB   Hyperlipidemia-continue atorvastatin    Depression-continue Lexapro    Mood disorder-continue Lamictal    Chronic anticoagulation -on eliquis   Hyponatremia: 128 today all the way down from 135 yesterday.  Perhaps an error.  Will recheck now and then in the morning.  DVT prophylaxis: SCDs Start: 11/20/23 1044 SCDs Start: 11/19/23 0239   Code Status: Full Code  Family Communication: Son present at bedside.  Plan of care discussed with patient in length and he/she verbalized understanding and agreed with it.  Status is: Inpatient Remains  inpatient appropriate because: Medically stable, pending placement to SNF   Estimated body mass index is 32.49 kg/m as calculated from the following:   Height as of this encounter: 5' 1 (1.549 m).   Weight as of this encounter: 78 kg.    Nutritional Assessment: Body mass index is 32.49 kg/m.SABRA Seen by dietician.  I agree with the assessment and plan as outlined below: Nutrition Status:        . Skin Assessment: I have examined the patient's skin and I agree with the wound assessment as performed by the wound care RN as outlined below:    Consultants:  Orthopedics-signed off  Procedures:  As above  Antimicrobials:  Anti-infectives (From admission, onward)    Start     Dose/Rate Route Frequency Ordered Stop   11/20/23 0945  ceFAZolin  (ANCEF ) IVPB 2g/100 mL premix  Status:  Discontinued        2 g 200 mL/hr over 30 Minutes Intravenous On call to O.R. 11/20/23 0941 11/20/23 1038         Subjective: Seen and examined, she has no complaints at all.  Objective: Vitals:   11/22/23 1357 11/22/23 2041 11/23/23 0558 11/23/23 0751  BP: 121/78 115/62 (!) 111/58 128/75  Pulse: 81 80 80 75  Resp: 16 15 15    Temp: 98.7 F (37.1 C) 98.9 F (37.2 C) 98.2 F (36.8 C)   TempSrc: Oral Oral Oral   SpO2: 97% 94% 91% 96%  Weight:      Height:        Intake/Output Summary (Last 24 hours) at 11/23/2023 1110 Last data filed at 11/23/2023  0900 Gross per 24 hour  Intake 1151.27 ml  Output 2650 ml  Net -1498.73 ml   Filed Weights   11/18/23 2223  Weight: 78 kg    Examination:  General exam: Appears calm and comfortable  Respiratory system: Clear to auscultation. Respiratory effort normal. Cardiovascular system: S1 & S2 heard, RRR. No JVD, murmurs, rubs, gallops or clicks. No pedal edema. Gastrointestinal system: Abdomen is nondistended, soft and nontender. No organomegaly or masses felt. Normal bowel sounds heard. Central nervous system: Alert and oriented. No focal  neurological deficits. Extremities: Symmetric 5 x 5 power. Skin: No rashes, lesions or ulcers Psychiatry: Judgement and insight appear normal. Mood & affect appropriate.    Data Reviewed: I have personally reviewed following labs and imaging studies  CBC: Recent Labs  Lab 11/18/23 2325 11/20/23 0333 11/20/23 1114 11/21/23 0345 11/23/23 0355  WBC 9.7 8.6 10.3 10.8* 8.7  NEUTROABS 7.2  --   --   --   --   HGB 11.5* 10.7* 10.6* 9.1* 9.6*  HCT 33.0* 33.3* 32.6* 27.1* 29.5*  MCV 90.7 93.5 94.2 93.8 92.8  PLT 245 223 210 196 243   Basic Metabolic Panel: Recent Labs  Lab 11/18/23 2325 11/19/23 1216 11/20/23 0333 11/20/23 1114 11/21/23 0345 11/23/23 0355  NA 135 134* 136  --  135 128*  K 2.9* 3.5 4.8  --  4.1 4.3  CL 99 103 103  --  103 93*  CO2 24 24 24   --  24 27  GLUCOSE 123* 96 96  --  139* 99  BUN 20 17 14   --  13 15  CREATININE 1.15* 0.82 0.75 0.87 0.84 0.92  CALCIUM  9.2 8.5* 8.8*  --  8.2* 8.5*  MG  --  1.4* 2.0  --   --   --    GFR: Estimated Creatinine Clearance: 47.7 mL/min (by C-G formula based on SCr of 0.92 mg/dL). Liver Function Tests: Recent Labs  Lab 11/21/23 0345 11/23/23 0355  AST 27 23  ALT 16 14  ALKPHOS 60 61  BILITOT 0.8 1.2  PROT 6.0* 6.1*  ALBUMIN 2.6* 2.6*   No results for input(s): LIPASE, AMYLASE in the last 168 hours. No results for input(s): AMMONIA in the last 168 hours. Coagulation Profile: Recent Labs  Lab 11/18/23 2325 11/19/23 1216 11/19/23 1420 11/20/23 0333 11/21/23 0345  INR 1.9* 1.5* 1.4* 1.2 1.3*   Cardiac Enzymes: No results for input(s): CKTOTAL, CKMB, CKMBINDEX, TROPONINI in the last 168 hours. BNP (last 3 results) No results for input(s): PROBNP in the last 8760 hours. HbA1C: No results for input(s): HGBA1C in the last 72 hours. CBG: No results for input(s): GLUCAP in the last 168 hours. Lipid Profile: No results for input(s): CHOL, HDL, LDLCALC, TRIG, CHOLHDL, LDLDIRECT in  the last 72 hours. Thyroid  Function Tests: No results for input(s): TSH, T4TOTAL, FREET4, T3FREE, THYROIDAB in the last 72 hours. Anemia Panel: No results for input(s): VITAMINB12, FOLATE, FERRITIN, TIBC, IRON, RETICCTPCT in the last 72 hours. Sepsis Labs: No results for input(s): PROCALCITON, LATICACIDVEN in the last 168 hours.  Recent Results (from the past 240 hours)  Surgical PCR screen     Status: None   Collection Time: 11/19/23  8:11 PM   Specimen: Nasal Mucosa; Nasal Swab  Result Value Ref Range Status   MRSA, PCR NEGATIVE NEGATIVE Final   Staphylococcus aureus NEGATIVE NEGATIVE Final    Comment: (NOTE) The Xpert SA Assay (FDA approved for NASAL specimens in patients 60 years of age  and older), is one component of a comprehensive surveillance program. It is not intended to diagnose infection nor to guide or monitor treatment. Performed at [facility], [address], [place], [place] [address]      Radiology Studies: No results found.  Scheduled Meds:  apixaban   5 mg Oral BID   docusate sodium   100 mg Oral BID   escitalopram   20 mg Oral Daily   hydrochlorothiazide   25 mg Oral Daily   lamoTRIgine   150 mg Oral Daily   metoprolol  tartrate  50 mg Oral BID   pantoprazole   40 mg Oral Daily   traMADol   50 mg Oral Q6H   Continuous Infusions:  dextrose  5 % and 0.45 % NaCl with KCl 20 mEq/L 10 mL/hr at 11/21/23 0548     LOS: 4 days   Fredia Skeeter, MD [facility]  11/23/2023, 11:10 AM   *Please note that this is a verbal dictation therefore any spelling or grammatical errors are due to the Dragon Medical One system interpretation.  Please page via Amion and do not message via secure chat for urgent patient care matters. Secure chat can be used for non urgent patient care matters.  How to contact the [facility] Attending or Consulting provider 7A - 7P or covering provider during after hours 7P -7A, for this patient?   Check the care team in [facility] and look for a) attending/consulting [facility] provider listed and b) the [facility] team listed. Page or secure chat 7A-7P. Log into [email or web address] and use [facility]'s universal password to access. If you do not have the password, please contact the hospital operator. Locate the [facility] provider you are looking for under [facility] and page to a number that you can be directly reached. If you still have difficulty reaching the provider, please page the [facility] (Director on Call) for the Hospitalists listed on amion for assistance.

## 2023-11-23 NOTE — Progress Notes (Signed)
 Subjective: 3 Days Post-Op Procedure(s) (LRB): INSERTION, INTRAMEDULLARY ROD, FEMUR (Left)  Patient states she feels much better today. She is hoping that her SNF bed will be ready tomorrow.  Activity level:  partial wb left leg Diet tolerance:  ok Voiding:  ok Patient reports pain as mild.    Objective: Vital signs in last 24 hours: Temp:  [98.2 F (36.8 C)-98.9 F (37.2 C)] 98.2 F (36.8 C) (07/30 0558) Pulse Rate:  [75-80] 75 (07/30 0751) Resp:  [15] 15 (07/30 0558) BP: (111-128)/(58-75) 128/75 (07/30 0751) SpO2:  [91 %-96 %] 96 % (07/30 0751)  Labs: Recent Labs    11/21/23 0345 11/23/23 0355  HGB 9.1* 9.6*   Recent Labs    11/21/23 0345 11/23/23 0355  WBC 10.8* 8.7  RBC 2.89* 3.18*  HCT 27.1* 29.5*  PLT 196 243   Recent Labs    11/21/23 0345 11/23/23 0355 11/23/23 1235  NA 135 128* 130*  K 4.1 4.3  --   CL 103 93*  --   CO2 24 27  --   BUN 13 15  --   CREATININE 0.84 0.92  --   GLUCOSE 139* 99  --   CALCIUM  8.2* 8.5*  --    Recent Labs    11/21/23 0345  INR 1.3*    Physical Exam:  Neurologically intact ABD soft Neurovascular intact Sensation intact distally Intact pulses distally Dorsiflexion/Plantar flexion intact Incision: dressing C/D/I and no drainage No cellulitis present Compartment soft  Assessment/Plan:  3 Days Post-Op Procedure(s) (LRB): INSERTION, INTRAMEDULLARY ROD, FEMUR (Left) Advance diet Up with therapy Patient is doing well. She will continue on her eliquis  and current pain meds.  Follow up in office 2 weeks post op.    Prentice Mt Damonte Frieson 11/23/2023, 3:01 PM

## 2023-11-23 NOTE — TOC Progression Note (Addendum)
 Transition of Care [facility]) - Progression Note    Patient Details  Name: Alexandra [name] MRN: [id number] Date of Birth: [birth year]  Transition of Care [facility]) CM/SW Contact  Alfonse JONELLE Rex, RN Phone Number: 11/23/2023, 1:28 PM  Clinical Narrative:  Met with patient and son at bedside to update no bed offer received from [facility] or [facility], reviewed current bed offer list, patient/son agreeable to [facility], [place], admit coord w/SNF notified. SNF auth initiated via [facility] Barrows ID [id number], barrows pending.     -2:46pm Per [facility], SNF barrows Le Plan Auth ID [id number], days approved 11/23/2023-11/25/2023, team notified. Per Rosaline, admit coordinator, patient able to admit today if DC Summary in by 3:30pm, team notified.     Expected Discharge Plan: Skilled Nursing Facility Barriers to Discharge: Continued Medical Work up               Expected Discharge Plan and Services       Living arrangements for the past 2 months: Single Family Home                                       Social Drivers of Health (SDOH) Interventions SDOH Screenings   Food Insecurity: Unknown (11/19/2023)  Housing: Low Risk  (11/19/2023)  Transportation Needs: No Transportation Needs (11/19/2023)  Utilities: Not At Risk (11/19/2023)  Social Connections: Moderately Integrated (11/19/2023)  Tobacco Use: Low Risk  (11/18/2023)    Readmission Risk Interventions    11/21/2023    9:45 AM  Readmission Risk Prevention Plan  Transportation Screening Complete  PCP or Specialist Appt within 5-7 Days Complete  Home Care Screening Complete  Medication Review (RN CM) Complete

## 2023-11-23 NOTE — Discharge Summary (Signed)
 Physician Discharge Summary  Alexandra [name] [facility] FMW:[id number] DOB: 03-Jun-1945 DOA: 11/18/2023  PCP: Bertell Satterfield, MD  Admit date: 11/18/2023 Discharge date: 11/23/2023 30 Day Unplanned Readmission Risk Score    Flowsheet Row ED to Hosp-Admission (Current) from 11/18/2023 in [facility]  30 Day Unplanned Readmission Risk Score (%) 16.14 Filed at 11/23/2023 1201    This score is the patient's risk of an unplanned readmission within 30 days of being discharged (0 -100%). The score is based on dignosis, age, lab data, medications, orders, and past utilization.   Low:  0-14.9   Medium: 15-21.9   High: 22-29.9   Extreme: 30 and above          Admitted From: Home Disposition: SNF  Recommendations for Outpatient Follow-up:  Follow up with PCP in 1-2 weeks Please obtain BMP/CBC in one week Follow-up with orthopedics in 2 weeks Please follow up with your PCP on the following pending results: Unresulted Labs (From admission, onward)     Start     Ordered   11/27/23 0500  Creatinine, serum  (BRIDGE to warfarin (includes Lovenox  or Heparin -AND- warfarin pharmacy consult))  Weekly,   R     Comments: While on enoxaparin  therapy    11/20/23 1043              Home Health: None Equipment/Devices: None  Discharge Condition: Stable CODE STATUS: Full code Diet recommendation: Cardiac  Subjective: Seen and examined.  No complaints.  Brief/Interim Summary: 78 y.o. female with medical history significant of A-fib on warfarin, depression, gout, hypertension, hyperlipidemia who presented after tripping on a rug at home landing on her left side.  She developed severe pain.  She went to the emergency department where she was found to be febrile hemodynamically stable.  Labs were obtained which demonstrated sodium 2.9, creatinine 1.15.  INR 1.9.  Patient underwent chest x-ray which showed no acute findings.  X-ray of hip showed left femoral neck fracture.   CT spine showed no  fracture.  CT head showed no acute abnormalities.  There was a thyroid  nodule recommended outpatient follow-up.   Orthopedics still recommend transfer to [facility].  Patient was admitted for further workup.  She was given IV vitamin K  to reverse her therapeutic INR.   Left hip femoral neck fracture status post mechanical fall on the rug, POA: Status post surgical repair 11/20/2023 by orthopedics.  PT OT recommends SNF.  She is being discharged to SNF today.   Paroxysmal A-fib-patient currently not in A-fib. -Coumadin  was reversed prior to surgery, now on Eliquis .  Rate is controlled, continue beta-blocker.  She was transitioned from Coumadin  to Eliquis  at discharge.   Hypertension-resume home/PTA medications.   Hyperlipidemia-continue atorvastatin    Depression-continue Lexapro    Mood disorder-continue Lamictal    Chronic anticoagulation -on eliquis    Hyponatremia: 128 today all the way down from 135 yesterday.  Repeat 130.  Patient without any symptoms.  Mild and should improve with more oral intake.  Discharge plan was discussed with patient and/or family member and they verbalized understanding and agreed with it.  Discharge Diagnoses:  Principal Problem:   Hip fracture [facility])    Discharge Instructions  Discharge Instructions     Partial weight bearing   Complete by: As directed    % Body Weight: 50   Laterality: left   Extremity: Lower      Allergies as of 11/23/2023       Reactions   Codeine Nausea And Vomiting, Nausea Only  Naproxen Itching        Medication List     STOP taking these medications    ALPRAZolam  0.5 MG tablet Commonly known as: XANAX    warfarin 6 MG tablet Commonly known as: COUMADIN        TAKE these medications    albuterol  108 (90 Base) MCG/ACT inhaler Commonly known as: VENTOLIN  HFA Inhale 1-2 puffs into the lungs every 6 (six) hours as needed for wheezing or shortness of breath.   apixaban  5 MG Tabs tablet Commonly known  as: ELIQUIS  Take 1 tablet (5 mg total) by mouth 2 (two) times daily.   atorvastatin  20 MG tablet Commonly known as: LIPITOR Take 20 mg by mouth daily.   diltiazem  30 MG tablet Commonly known as: Cardizem  Take 1 tablet (30 mg total) by mouth 4 (four) times daily as needed.   Enbrel SureClick 50 MG/ML injection Generic drug: etanercept Inject 50 mg into the skin once a week. Saturday   escitalopram  20 MG tablet Commonly known as: LEXAPRO  Take 20 mg by mouth daily.   famotidine  20 MG tablet Commonly known as: Pepcid  One after supper   FEROSUL PO Take 1 tablet by mouth daily.   folic acid  1 MG tablet Commonly known as: FOLVITE  Take 1 mg by mouth daily.   hydrochlorothiazide  25 MG tablet Commonly known as: HYDRODIURIL  Take 25 mg by mouth daily.   lamoTRIgine  150 MG tablet Commonly known as: LAMICTAL  Take 150 mg by mouth daily.   losartan  25 MG tablet Commonly known as: COZAAR  Take 1 tablet (25 mg total) by mouth as directed. hold losartan  for 2 weeks, if your top blood pressure number is less than 140, stop losartan . If your top blood pressure number is greater than 140 mg, restart losartan  at 25 mg daily   methotrexate 1 g injection Commonly known as: 50 mg/ml Inject 20 mg into the vein once a week. SQ   metoprolol  tartrate 50 MG tablet Commonly known as: LOPRESSOR  TAKE 1 TABLET TWICE DAILY   nitroGLYCERIN  0.4 MG SL tablet Commonly known as: NITROSTAT  Place 1 tablet (0.4 mg total) under the tongue every 5 (five) minutes x 3 doses as needed for chest pain (If no relief after 3rd dose, call 911 or go to ED.).   oxyCODONE -acetaminophen  5-325 MG tablet Commonly known as: PERCOCET/ROXICET Take 1 tablet by mouth every 4 (four) hours as needed for severe pain (pain score 7-10) (post op pain).   pantoprazole  40 MG tablet Commonly known as: Protonix  Take 1 tablet (40 mg total) by mouth daily. Take 30-60 min before first meal of the day   tiZANidine  2 MG  tablet Commonly known as: ZANAFLEX  Take 1 tablet (2 mg total) by mouth every 6 (six) hours as needed for muscle spasms.               Discharge Care Instructions  (From admission, onward)           Start     Ordered   11/20/23 0000  Partial weight bearing       Question Answer Comment  % Body Weight 50   Laterality left   Extremity Lower      11/20/23 0945            Contact information for follow-up providers     Sheril Coy, MD Follow up in 2 week(s).   Specialty: Orthopedic Surgery Contact information: [address] [place] [address] [phone]         Bertell Satterfield,  MD Follow up in 1 week(s).   Specialty: Internal Medicine Contact information: [address] [place] [address] [phone]              Contact information for after-discharge care     Destination     [facility]   Service: Skilled Nursing Contact information: [address] [place]  [address] [phone]                    Allergies  Allergen Reactions   Codeine Nausea And Vomiting and Nausea Only   Naproxen Itching    Consultations: Orthopedics   Procedures/Studies: DG FEMUR MIN 2 VIEWS LEFT Result Date: 11/20/2023 CLINICAL DATA:  Elective surgery. EXAM: LEFT FEMUR 2 VIEWS COMPARISON:  Radiograph dated 11/18/2023. FINDINGS: Seven intraoperative fluoroscopic spot images provided. The total fluoroscopic time is 1 minute 21 seconds with a cumulative air Karma of 11.4 mGy. Status post ORIF of left femoral neck fracture. IMPRESSION: Intraoperative fluoroscopic spot images. Electronically Signed   By: Vanetta Chou M.D.   On: 11/20/2023 13:12   DG C-Arm 1-60 Min-No Report Result Date: 11/20/2023 Fluoroscopy was utilized by the requesting physician.  No radiographic interpretation.   DG C-Arm 1-60 Min-No Report Result Date: 11/20/2023 Fluoroscopy was utilized by the requesting  physician.  No radiographic interpretation.   DG Chest Port 1 View Result Date: 11/18/2023 CLINICAL DATA:  Preoperative EXAM: PORTABLE CHEST 1 VIEW COMPARISON:  Chest x-ray 05/25/2023 FINDINGS: The heart size and mediastinal contours are within normal limits. Both lungs are clear. The visualized skeletal structures are unremarkable. IMPRESSION: No active disease. Electronically Signed   By: Greig Pique M.D.   On: 11/18/2023 23:21   DG Hip Unilat With Pelvis 2-3 Views Left Result Date: 11/18/2023 CLINICAL DATA:  Fall, left hip pain EXAM: DG HIP (WITH OR WITHOUT PELVIS) 2-3V LEFT COMPARISON:  None Available. FINDINGS: There is a left femoral intertrochanteric fracture with varus angulation and mild displacement. No subluxation or dislocation. IMPRESSION: Left femoral intertrochanteric fracture with varus angulation. Electronically Signed   By: Franky Crease M.D.   On: 11/18/2023 23:02   CT Head Wo Contrast Result Date: 11/18/2023 CLINICAL DATA:  Head trauma, minor (Age >= 65y); Neck trauma (Age >= 65y). Fall, head injury EXAM: CT HEAD WITHOUT CONTRAST CT CERVICAL SPINE WITHOUT CONTRAST TECHNIQUE: Multidetector CT imaging of the head and cervical spine was performed following the standard protocol without intravenous contrast. Multiplanar CT image reconstructions of the cervical spine were also generated. RADIATION DOSE REDUCTION: This exam was performed according to the departmental dose-optimization program which includes automated exposure control, adjustment of the mA and/or kV according to patient size and/or use of iterative reconstruction technique. COMPARISON:  None Available. FINDINGS: CT HEAD FINDINGS Brain: Normal anatomic configuration. Parenchymal volume loss is commensurate with the patient's age. Mild periventricular white matter changes are present likely reflecting the sequela of small vessel ischemia. No abnormal intra or extra-axial mass lesion or fluid collection. No abnormal mass effect  or midline shift. No evidence of acute intracranial hemorrhage or infarct. Ventricular size is normal. Cerebellum unremarkable. Vascular: No asymmetric hyperdense vasculature at the skull base. Skull: Intact Sinuses/Orbits: Paranasal sinuses are clear. Orbits are unremarkable. Other: Mastoid air cells and middle ear cavities are clear. CT CERVICAL SPINE FINDINGS Alignment: Normal. Skull base and vertebrae: No acute fracture. No primary bone lesion or focal pathologic process. Soft tissues and spinal canal: No prevertebral fluid or swelling. No visible canal  hematoma. Disc levels: Intervertebral disc space narrowing and endplate remodeling is seen throughout the cervical spine in keeping with changes of diffuse comp moderate degenerative disc disease, most severe at C4-5. Prevertebral soft tissues are not thickened on sagittal reformats. Multilevel uncovertebral and facet arthrosis results in moderate to severe bilateral neuroforaminal narrowing at C3-4 and C4-5 and more moderate neuroforaminal narrowing on the left at C5-6. No high-grade canal stenosis. Upper chest: Negative. Other: 2.6 cm hypodense right thyroid  nodule noted, not well characterized on this examination. IMPRESSION: 1. No acute intracranial abnormality. No skull fracture. 2. No acute fracture or listhesis of the cervical spine. 3. 2.6 cm right thyroid  nodule, not well characterized on this examination. Recommend thyroid  US  (ref: J Am Coll Radiol. 2015 Feb;12(2): 143-50). 4. 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 Electronically Signed   By: Dorethia Molt M.D.   On: 11/18/2023 22:56   CT Cervical Spine Wo Contrast Result Date: 11/18/2023 CLINICAL DATA:  Head trauma, minor (Age >= 65y); Neck trauma (Age >= 65y). Fall, head injury EXAM: CT HEAD WITHOUT CONTRAST CT CERVICAL  SPINE WITHOUT CONTRAST TECHNIQUE: Multidetector CT imaging of the head and cervical spine was performed following the standard protocol without intravenous contrast. Multiplanar CT image reconstructions of the cervical spine were also generated. RADIATION DOSE REDUCTION: This exam was performed according to the departmental dose-optimization program which includes automated exposure control, adjustment of the mA and/or kV according to patient size and/or use of iterative reconstruction technique. COMPARISON:  None Available. FINDINGS: CT HEAD FINDINGS Brain: Normal anatomic configuration. Parenchymal volume loss is commensurate with the patient's age. Mild periventricular white matter changes are present likely reflecting the sequela of small vessel ischemia. No abnormal intra or extra-axial mass lesion or fluid collection. No abnormal mass effect or midline shift. No evidence of acute intracranial hemorrhage or infarct. Ventricular size is normal. Cerebellum unremarkable. Vascular: No asymmetric hyperdense vasculature at the skull base. Skull: Intact Sinuses/Orbits: Paranasal sinuses are clear. Orbits are unremarkable. Other: Mastoid air cells and middle ear cavities are clear. CT CERVICAL SPINE FINDINGS Alignment: Normal. Skull base and vertebrae: No acute fracture. No primary bone lesion or focal pathologic process. Soft tissues and spinal canal: No prevertebral fluid or swelling. No visible canal hematoma. Disc levels: Intervertebral disc space narrowing and endplate remodeling is seen throughout the cervical spine in keeping with changes of diffuse comp moderate degenerative disc disease, most severe at C4-5. Prevertebral soft tissues are not thickened on sagittal reformats. Multilevel uncovertebral and facet arthrosis results in moderate to severe bilateral neuroforaminal narrowing at C3-4 and C4-5 and more moderate neuroforaminal narrowing on the left at C5-6. No high-grade canal stenosis. Upper chest:  Negative. Other: 2.6 cm hypodense right thyroid  nodule noted, not well characterized on this examination. IMPRESSION: 1. No acute intracranial abnormality. No skull fracture. 2. No acute fracture or listhesis of the cervical spine. 3. 2.6 cm right thyroid  nodule, not well characterized on this examination. Recommend thyroid  US  (ref: J Am Coll Radiol. 2015 Feb;12(2): 143-50). 4. 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 Electronically Signed   By: Dorethia Molt M.D.   On: 11/18/2023 22:56     Discharge Exam: Vitals:   11/23/23 0558 11/23/23 0751  BP: ROLLEN)  111/58 128/75  Pulse: 80 75  Resp: 15   Temp: 98.2 F (36.8 C)   SpO2: 91% 96%   Vitals:   11/22/23 1357 11/22/23 2041 11/23/23 0558 11/23/23 0751  BP: 121/78 115/62 (!) 111/58 128/75  Pulse: 81 80 80 75  Resp: 16 15 15    Temp: 98.7 F (37.1 C) 98.9 F (37.2 C) 98.2 F (36.8 C)   TempSrc: Oral Oral Oral   SpO2: 97% 94% 91% 96%  Weight:      Height:        General: Pt is alert, awake, not in acute distress Cardiovascular: RRR, S1/S2 +, no rubs, no gallops Respiratory: CTA bilaterally, no wheezing, no rhonchi Abdominal: Soft, NT, ND, bowel sounds + Extremities: no edema, no cyanosis    The results of significant diagnostics from this hospitalization (including imaging, microbiology, ancillary and laboratory) are listed below for reference.     Microbiology: Recent Results (from the past 240 hours)  Surgical PCR screen     Status: None   Collection Time: 11/19/23  8:11 PM   Specimen: Nasal Mucosa; Nasal Swab  Result Value Ref Range Status   MRSA, PCR NEGATIVE NEGATIVE Final   Staphylococcus aureus NEGATIVE NEGATIVE Final    Comment: (NOTE) The Xpert SA Assay (FDA approved for NASAL specimens in patients 57 years of age and older), is one  component of a comprehensive surveillance program. It is not intended to diagnose infection nor to guide or monitor treatment. Performed at [facility], [address] [place], [place] [address]      Labs: BNP (last 3 results) No results for input(s): BNP in the last 8760 hours. Basic Metabolic Panel: Recent Labs  Lab 11/18/23 2325 11/19/23 1216 11/20/23 0333 11/20/23 1114 11/21/23 0345 11/23/23 0355 11/23/23 1235  NA 135 134* 136  --  135 128* 130*  K 2.9* 3.5 4.8  --  4.1 4.3  --   CL 99 103 103  --  103 93*  --   CO2 24 24 24   --  24 27  --   GLUCOSE 123* 96 96  --  139* 99  --   BUN 20 17 14   --  13 15  --   CREATININE 1.15* 0.82 0.75 0.87 0.84 0.92  --   CALCIUM  9.2 8.5* 8.8*  --  8.2* 8.5*  --   MG  --  1.4* 2.0  --   --   --   --    Liver Function Tests: Recent Labs  Lab 11/21/23 0345 11/23/23 0355  AST 27 23  ALT 16 14  ALKPHOS 60 61  BILITOT 0.8 1.2  PROT 6.0* 6.1*  ALBUMIN 2.6* 2.6*   No results for input(s): LIPASE, AMYLASE in the last 168 hours. No results for input(s): AMMONIA in the last 168 hours. CBC: Recent Labs  Lab 11/18/23 2325 11/20/23 0333 11/20/23 1114 11/21/23 0345 11/23/23 0355  WBC 9.7 8.6 10.3 10.8* 8.7  NEUTROABS 7.2  --   --   --   --   HGB 11.5* 10.7* 10.6* 9.1* 9.6*  HCT 33.0* 33.3* 32.6* 27.1* 29.5*  MCV 90.7 93.5 94.2 93.8 92.8  PLT 245 223 210 196 243   Cardiac Enzymes: No results for input(s): CKTOTAL, CKMB, CKMBINDEX, TROPONINI in the last 168 hours. BNP: Invalid input(s): POCBNP CBG: No results for input(s): GLUCAP in the last 168 hours. D-Dimer No results for input(s): DDIMER in the last 72 hours. Hgb A1c No results for  input(s): HGBA1C in the last 72 hours. Lipid Profile No results for input(s): CHOL, HDL, LDLCALC, TRIG, CHOLHDL, LDLDIRECT in the last 72 hours. Thyroid  function studies No results for input(s): TSH, T4TOTAL, T3FREE, THYROIDAB  in the last 72 hours.  Invalid input(s): FREET3 Anemia work up No results for input(s): VITAMINB12, FOLATE, FERRITIN, TIBC, IRON, RETICCTPCT in the last 72 hours. Urinalysis    Component Value Date/Time   COLORURINE YELLOW 05/08/2007 1353   APPEARANCEUR CLEAR 05/08/2007 1353   LABSPEC 1.016 05/08/2007 1353   PHURINE 8.0 05/08/2007 1353   GLUCOSEU NEGATIVE 05/08/2007 1353   HGBUR NEGATIVE 05/08/2007 1353   BILIRUBINUR NEGATIVE 05/08/2007 1353   KETONESUR NEGATIVE 05/08/2007 1353   PROTEINUR NEGATIVE 05/08/2007 1353   UROBILINOGEN 0.2 05/08/2007 1353   NITRITE NEGATIVE 05/08/2007 1353   LEUKOCYTESUR  05/08/2007 1353    NEGATIVE MICROSCOPIC NOT DONE ON URINES WITH NEGATIVE PROTEIN, BLOOD, LEUKOCYTES, NITRITE, OR GLUCOSE <1000 mg/dL.   Sepsis Labs Recent Labs  Lab 11/20/23 0333 11/20/23 1114 11/21/23 0345 11/23/23 0355  WBC 8.6 10.3 10.8* 8.7   Microbiology Recent Results (from the past 240 hours)  Surgical PCR screen     Status: None   Collection Time: 11/19/23  8:11 PM   Specimen: Nasal Mucosa; Nasal Swab  Result Value Ref Range Status   MRSA, PCR NEGATIVE NEGATIVE Final   Staphylococcus aureus NEGATIVE NEGATIVE Final    Comment: (NOTE) The Xpert SA Assay (FDA approved for NASAL specimens in patients 25 years of age and older), is one component of a comprehensive surveillance program. It is not intended to diagnose infection nor to guide or monitor treatment. Performed at [facility], [address] [place], [place] [address]     FURTHER DISCHARGE INSTRUCTIONS:   Get Medicines reviewed and adjusted: Please take all your medications with you for your next visit with your Primary MD   Laboratory/radiological data: Please request your Primary MD to go over all hospital tests and procedure/radiological results at the follow up, please ask your Primary MD to get all Hospital records sent to his/her office.   In some cases, they will  be blood work, cultures and biopsy results pending at the time of your discharge. Please request that your primary care M.D. goes through all the records of your hospital data and follows up on these results.   Also Note the following: If you experience worsening of your admission symptoms, develop shortness of breath, life threatening emergency, suicidal or homicidal thoughts you must seek medical attention immediately by calling 911 or calling your MD immediately  if symptoms less severe.   You must read complete instructions/literature along with all the possible adverse reactions/side effects for all the Medicines you take and that have been prescribed to you. Take any new Medicines after you have completely understood and accpet all the possible adverse reactions/side effects.    patient was instructed, not to drive, operate heavy machinery, perform activities at heights, swimming or participation in water  activities or provide baby-sitting services while on Pain, Sleep and Anxiety Medications; until their outpatient Physician has advised to do so again. Also recommended to not to take more than prescribed Pain, Sleep and Anxiety Medications.  It is not advisable to combine anxiety, sleep and pain medications without talking with your primary care provider.     Wear Seat belts while driving.   Please note: You were cared for by a hospitalist during your hospital stay. Once you are discharged, your primary care physician  will handle any further medical issues. Please note that NO REFILLS for any discharge medications will be authorized once you are discharged, as it is imperative that you return to your primary care physician (or establish a relationship with a primary care physician if you do not have one) for your post hospital discharge needs so that they can reassess your need for medications and monitor your lab values  Time coordinating discharge: Over 30 minutes  SIGNED:   Fredia Skeeter,  MD  [facility] 11/23/2023, 2:54 PM *Please note that this is a verbal dictation therefore any spelling or grammatical errors are due to the Dragon Medical One system interpretation. If 7PM-7AM, please contact night-coverage [email or web address]

## 2023-11-23 NOTE — TOC Transition Note (Signed)
 Transition of Care [facility]) - Discharge Note   Patient Details  Name: Alexandra [name] MRN: [id number] Date of Birth: 11-19-1945  [facility] CM/SW Contact:  Alfonse JONELLE Rex, RN Phone Number: 11/23/2023, 3:24 PM   Clinical Narrative:  DC to SNF [facility]), RM [phone] per Rosaline, admit coordinator at facility. Patient notified, patient to notify family. PTAR for transport. No further TOC needs identified at this time.       Final next level of care: Skilled Nursing Facility Barriers to Discharge: Barriers Resolved   Patient Goals and CMS Choice Patient states their goals for this hospitalization and ongoing recovery are:: return home CMS Medicare.gov Compare Post Acute Care list provided to:: Patient Choice offered to / list presented to : Patient [place] ownership interest in [facility].provided to:: Patient    Discharge Placement              Patient chooses bed at: [facility] Patient to be transferred to facility by: [facility] Name of family member notified: patient notified family Patient and family notified of of transfer: 11/23/23  Discharge Plan and Services Additional resources added to the After Visit Summary for                                       Social Drivers of Health (SDOH) Interventions SDOH Screenings   Food Insecurity: Unknown (11/19/2023)  Housing: Low Risk  (11/19/2023)  Transportation Needs: No Transportation Needs (11/19/2023)  Utilities: Not At Risk (11/19/2023)  Social Connections: Moderately Integrated (11/19/2023)  Tobacco Use: Low Risk  (11/18/2023)     Readmission Risk Interventions    11/21/2023    9:45 AM  Readmission Risk Prevention Plan  Transportation Screening Complete  PCP or Specialist Appt within 5-7 Days Complete  Home Care Screening Complete  Medication Review (RN CM) Complete

## 2023-11-24 DIAGNOSIS — Z79899 Other long term (current) drug therapy: Secondary | ICD-10-CM | POA: Diagnosis not present

## 2023-11-24 DIAGNOSIS — S72002A Fracture of unspecified part of neck of left femur, initial encounter for closed fracture: Secondary | ICD-10-CM | POA: Diagnosis not present

## 2023-11-24 DIAGNOSIS — Z7901 Long term (current) use of anticoagulants: Secondary | ICD-10-CM | POA: Diagnosis not present

## 2023-11-24 DIAGNOSIS — I10 Essential (primary) hypertension: Secondary | ICD-10-CM | POA: Diagnosis not present

## 2023-11-24 DIAGNOSIS — I480 Paroxysmal atrial fibrillation: Secondary | ICD-10-CM | POA: Diagnosis not present

## 2023-11-25 DIAGNOSIS — S72009D Fracture of unspecified part of neck of unspecified femur, subsequent encounter for closed fracture with routine healing: Secondary | ICD-10-CM | POA: Diagnosis not present

## 2023-11-25 DIAGNOSIS — Z9181 History of falling: Secondary | ICD-10-CM | POA: Diagnosis not present

## 2023-11-25 DIAGNOSIS — Z79899 Other long term (current) drug therapy: Secondary | ICD-10-CM | POA: Diagnosis not present

## 2023-11-25 DIAGNOSIS — R2689 Other abnormalities of gait and mobility: Secondary | ICD-10-CM | POA: Diagnosis not present

## 2023-11-25 DIAGNOSIS — Z7901 Long term (current) use of anticoagulants: Secondary | ICD-10-CM | POA: Diagnosis not present

## 2023-11-25 DIAGNOSIS — I10 Essential (primary) hypertension: Secondary | ICD-10-CM | POA: Diagnosis not present

## 2023-11-25 DIAGNOSIS — F32A Depression, unspecified: Secondary | ICD-10-CM | POA: Diagnosis not present

## 2023-11-25 DIAGNOSIS — S72002A Fracture of unspecified part of neck of left femur, initial encounter for closed fracture: Secondary | ICD-10-CM | POA: Diagnosis not present

## 2023-11-25 DIAGNOSIS — D84821 Immunodeficiency due to drugs: Secondary | ICD-10-CM | POA: Diagnosis not present

## 2023-11-25 DIAGNOSIS — I480 Paroxysmal atrial fibrillation: Secondary | ICD-10-CM | POA: Diagnosis not present

## 2023-11-25 DIAGNOSIS — M6281 Muscle weakness (generalized): Secondary | ICD-10-CM | POA: Diagnosis not present

## 2023-11-29 ENCOUNTER — Encounter

## 2023-11-29 DIAGNOSIS — F32A Depression, unspecified: Secondary | ICD-10-CM | POA: Diagnosis not present

## 2023-11-29 DIAGNOSIS — M6281 Muscle weakness (generalized): Secondary | ICD-10-CM | POA: Diagnosis not present

## 2023-11-29 DIAGNOSIS — D84821 Immunodeficiency due to drugs: Secondary | ICD-10-CM | POA: Diagnosis not present

## 2023-11-29 DIAGNOSIS — S72009D Fracture of unspecified part of neck of unspecified femur, subsequent encounter for closed fracture with routine healing: Secondary | ICD-10-CM | POA: Diagnosis not present

## 2023-11-29 DIAGNOSIS — R2689 Other abnormalities of gait and mobility: Secondary | ICD-10-CM | POA: Diagnosis not present

## 2023-11-29 DIAGNOSIS — Z9181 History of falling: Secondary | ICD-10-CM | POA: Diagnosis not present

## 2023-11-29 DIAGNOSIS — I480 Paroxysmal atrial fibrillation: Secondary | ICD-10-CM | POA: Diagnosis not present

## 2023-12-02 DIAGNOSIS — I480 Paroxysmal atrial fibrillation: Secondary | ICD-10-CM | POA: Diagnosis not present

## 2023-12-02 DIAGNOSIS — Z7901 Long term (current) use of anticoagulants: Secondary | ICD-10-CM | POA: Diagnosis not present

## 2023-12-02 DIAGNOSIS — M25552 Pain in left hip: Secondary | ICD-10-CM | POA: Diagnosis not present

## 2023-12-02 DIAGNOSIS — I10 Essential (primary) hypertension: Secondary | ICD-10-CM | POA: Diagnosis not present

## 2023-12-02 DIAGNOSIS — M25562 Pain in left knee: Secondary | ICD-10-CM | POA: Diagnosis not present

## 2023-12-02 DIAGNOSIS — Z79899 Other long term (current) drug therapy: Secondary | ICD-10-CM | POA: Diagnosis not present

## 2023-12-02 DIAGNOSIS — S72002A Fracture of unspecified part of neck of left femur, initial encounter for closed fracture: Secondary | ICD-10-CM | POA: Diagnosis not present

## 2023-12-05 DIAGNOSIS — S72002D Fracture of unspecified part of neck of left femur, subsequent encounter for closed fracture with routine healing: Secondary | ICD-10-CM | POA: Diagnosis not present

## 2023-12-05 DIAGNOSIS — Z79899 Other long term (current) drug therapy: Secondary | ICD-10-CM | POA: Diagnosis not present

## 2023-12-05 DIAGNOSIS — I10 Essential (primary) hypertension: Secondary | ICD-10-CM | POA: Diagnosis not present

## 2023-12-05 DIAGNOSIS — Z7901 Long term (current) use of anticoagulants: Secondary | ICD-10-CM | POA: Diagnosis not present

## 2023-12-05 DIAGNOSIS — I480 Paroxysmal atrial fibrillation: Secondary | ICD-10-CM | POA: Diagnosis not present

## 2023-12-05 DIAGNOSIS — S72002A Fracture of unspecified part of neck of left femur, initial encounter for closed fracture: Secondary | ICD-10-CM | POA: Diagnosis not present

## 2023-12-06 DIAGNOSIS — S72009D Fracture of unspecified part of neck of unspecified femur, subsequent encounter for closed fracture with routine healing: Secondary | ICD-10-CM | POA: Diagnosis not present

## 2023-12-06 DIAGNOSIS — D84821 Immunodeficiency due to drugs: Secondary | ICD-10-CM | POA: Diagnosis not present

## 2023-12-06 DIAGNOSIS — Z9181 History of falling: Secondary | ICD-10-CM | POA: Diagnosis not present

## 2023-12-06 DIAGNOSIS — F32A Depression, unspecified: Secondary | ICD-10-CM | POA: Diagnosis not present

## 2023-12-06 DIAGNOSIS — R2689 Other abnormalities of gait and mobility: Secondary | ICD-10-CM | POA: Diagnosis not present

## 2023-12-06 DIAGNOSIS — M6281 Muscle weakness (generalized): Secondary | ICD-10-CM | POA: Diagnosis not present

## 2023-12-06 DIAGNOSIS — I480 Paroxysmal atrial fibrillation: Secondary | ICD-10-CM | POA: Diagnosis not present

## 2023-12-07 DIAGNOSIS — I4891 Unspecified atrial fibrillation: Secondary | ICD-10-CM | POA: Diagnosis not present

## 2023-12-07 DIAGNOSIS — E871 Hypo-osmolality and hyponatremia: Secondary | ICD-10-CM | POA: Diagnosis not present

## 2023-12-07 DIAGNOSIS — M545 Low back pain, unspecified: Secondary | ICD-10-CM | POA: Diagnosis not present

## 2023-12-07 DIAGNOSIS — I10 Essential (primary) hypertension: Secondary | ICD-10-CM | POA: Diagnosis not present

## 2023-12-07 DIAGNOSIS — S72002D Fracture of unspecified part of neck of left femur, subsequent encounter for closed fracture with routine healing: Secondary | ICD-10-CM | POA: Diagnosis not present

## 2023-12-07 DIAGNOSIS — F3341 Major depressive disorder, recurrent, in partial remission: Secondary | ICD-10-CM | POA: Diagnosis not present

## 2023-12-08 DIAGNOSIS — G894 Chronic pain syndrome: Secondary | ICD-10-CM | POA: Diagnosis not present

## 2023-12-08 DIAGNOSIS — I10 Essential (primary) hypertension: Secondary | ICD-10-CM | POA: Diagnosis not present

## 2023-12-08 DIAGNOSIS — S72002A Fracture of unspecified part of neck of left femur, initial encounter for closed fracture: Secondary | ICD-10-CM | POA: Diagnosis not present

## 2023-12-08 DIAGNOSIS — I4891 Unspecified atrial fibrillation: Secondary | ICD-10-CM | POA: Diagnosis not present

## 2023-12-08 DIAGNOSIS — Z79899 Other long term (current) drug therapy: Secondary | ICD-10-CM | POA: Diagnosis not present

## 2023-12-08 DIAGNOSIS — F3340 Major depressive disorder, recurrent, in remission, unspecified: Secondary | ICD-10-CM | POA: Diagnosis not present

## 2023-12-08 DIAGNOSIS — Z7901 Long term (current) use of anticoagulants: Secondary | ICD-10-CM | POA: Diagnosis not present

## 2023-12-09 DIAGNOSIS — F32A Depression, unspecified: Secondary | ICD-10-CM | POA: Diagnosis not present

## 2023-12-09 DIAGNOSIS — M6281 Muscle weakness (generalized): Secondary | ICD-10-CM | POA: Diagnosis not present

## 2023-12-09 DIAGNOSIS — S72009D Fracture of unspecified part of neck of unspecified femur, subsequent encounter for closed fracture with routine healing: Secondary | ICD-10-CM | POA: Diagnosis not present

## 2023-12-09 DIAGNOSIS — D84821 Immunodeficiency due to drugs: Secondary | ICD-10-CM | POA: Diagnosis not present

## 2023-12-09 DIAGNOSIS — Z9181 History of falling: Secondary | ICD-10-CM | POA: Diagnosis not present

## 2023-12-09 DIAGNOSIS — R2689 Other abnormalities of gait and mobility: Secondary | ICD-10-CM | POA: Diagnosis not present

## 2023-12-09 DIAGNOSIS — I480 Paroxysmal atrial fibrillation: Secondary | ICD-10-CM | POA: Diagnosis not present

## 2023-12-13 DIAGNOSIS — Z7901 Long term (current) use of anticoagulants: Secondary | ICD-10-CM | POA: Diagnosis not present

## 2023-12-13 DIAGNOSIS — I4891 Unspecified atrial fibrillation: Secondary | ICD-10-CM | POA: Diagnosis not present

## 2023-12-13 DIAGNOSIS — G894 Chronic pain syndrome: Secondary | ICD-10-CM | POA: Diagnosis not present

## 2023-12-13 DIAGNOSIS — I10 Essential (primary) hypertension: Secondary | ICD-10-CM | POA: Diagnosis not present

## 2023-12-13 DIAGNOSIS — Z79899 Other long term (current) drug therapy: Secondary | ICD-10-CM | POA: Diagnosis not present

## 2023-12-13 DIAGNOSIS — S72002A Fracture of unspecified part of neck of left femur, initial encounter for closed fracture: Secondary | ICD-10-CM | POA: Diagnosis not present

## 2023-12-13 DIAGNOSIS — F3340 Major depressive disorder, recurrent, in remission, unspecified: Secondary | ICD-10-CM | POA: Diagnosis not present

## 2023-12-15 DIAGNOSIS — M109 Gout, unspecified: Secondary | ICD-10-CM | POA: Diagnosis not present

## 2023-12-15 DIAGNOSIS — E785 Hyperlipidemia, unspecified: Secondary | ICD-10-CM | POA: Diagnosis not present

## 2023-12-15 DIAGNOSIS — I480 Paroxysmal atrial fibrillation: Secondary | ICD-10-CM | POA: Diagnosis not present

## 2023-12-15 DIAGNOSIS — E871 Hypo-osmolality and hyponatremia: Secondary | ICD-10-CM | POA: Diagnosis not present

## 2023-12-15 DIAGNOSIS — M545 Low back pain, unspecified: Secondary | ICD-10-CM | POA: Diagnosis not present

## 2023-12-15 DIAGNOSIS — S72002D Fracture of unspecified part of neck of left femur, subsequent encounter for closed fracture with routine healing: Secondary | ICD-10-CM | POA: Diagnosis not present

## 2023-12-15 DIAGNOSIS — R32 Unspecified urinary incontinence: Secondary | ICD-10-CM | POA: Diagnosis not present

## 2023-12-15 DIAGNOSIS — F3341 Major depressive disorder, recurrent, in partial remission: Secondary | ICD-10-CM | POA: Diagnosis not present

## 2023-12-15 DIAGNOSIS — Z7901 Long term (current) use of anticoagulants: Secondary | ICD-10-CM | POA: Diagnosis not present

## 2023-12-15 DIAGNOSIS — I10 Essential (primary) hypertension: Secondary | ICD-10-CM | POA: Diagnosis not present

## 2023-12-15 DIAGNOSIS — Z7952 Long term (current) use of systemic steroids: Secondary | ICD-10-CM | POA: Diagnosis not present

## 2023-12-20 ENCOUNTER — Ambulatory Visit: Admitting: Internal Medicine

## 2023-12-20 ENCOUNTER — Ambulatory Visit: Payer: Self-pay | Admitting: *Deleted

## 2023-12-20 ENCOUNTER — Telehealth: Payer: Self-pay | Admitting: Internal Medicine

## 2023-12-20 DIAGNOSIS — M545 Low back pain, unspecified: Secondary | ICD-10-CM | POA: Diagnosis not present

## 2023-12-20 DIAGNOSIS — E785 Hyperlipidemia, unspecified: Secondary | ICD-10-CM | POA: Diagnosis not present

## 2023-12-20 DIAGNOSIS — E871 Hypo-osmolality and hyponatremia: Secondary | ICD-10-CM | POA: Diagnosis not present

## 2023-12-20 DIAGNOSIS — I480 Paroxysmal atrial fibrillation: Secondary | ICD-10-CM | POA: Diagnosis not present

## 2023-12-20 DIAGNOSIS — M109 Gout, unspecified: Secondary | ICD-10-CM | POA: Diagnosis not present

## 2023-12-20 DIAGNOSIS — S72002D Fracture of unspecified part of neck of left femur, subsequent encounter for closed fracture with routine healing: Secondary | ICD-10-CM | POA: Diagnosis not present

## 2023-12-20 DIAGNOSIS — I10 Essential (primary) hypertension: Secondary | ICD-10-CM | POA: Diagnosis not present

## 2023-12-20 DIAGNOSIS — R32 Unspecified urinary incontinence: Secondary | ICD-10-CM | POA: Diagnosis not present

## 2023-12-20 DIAGNOSIS — F3341 Major depressive disorder, recurrent, in partial remission: Secondary | ICD-10-CM | POA: Diagnosis not present

## 2023-12-20 NOTE — Telephone Encounter (Signed)
 Pt said she will not be returning for coumadin  appts anymore due to switching to eliquis .

## 2023-12-20 NOTE — Telephone Encounter (Signed)
 Removed from anticoag clinic.

## 2023-12-20 NOTE — Progress Notes (Signed)
 Erroneous encounter - please disregard.

## 2023-12-21 DIAGNOSIS — R32 Unspecified urinary incontinence: Secondary | ICD-10-CM | POA: Diagnosis not present

## 2023-12-21 DIAGNOSIS — I480 Paroxysmal atrial fibrillation: Secondary | ICD-10-CM | POA: Diagnosis not present

## 2023-12-21 DIAGNOSIS — I10 Essential (primary) hypertension: Secondary | ICD-10-CM | POA: Diagnosis not present

## 2023-12-21 DIAGNOSIS — E871 Hypo-osmolality and hyponatremia: Secondary | ICD-10-CM | POA: Diagnosis not present

## 2023-12-21 DIAGNOSIS — M109 Gout, unspecified: Secondary | ICD-10-CM | POA: Diagnosis not present

## 2023-12-21 DIAGNOSIS — M545 Low back pain, unspecified: Secondary | ICD-10-CM | POA: Diagnosis not present

## 2023-12-21 DIAGNOSIS — E785 Hyperlipidemia, unspecified: Secondary | ICD-10-CM | POA: Diagnosis not present

## 2023-12-21 DIAGNOSIS — F3341 Major depressive disorder, recurrent, in partial remission: Secondary | ICD-10-CM | POA: Diagnosis not present

## 2023-12-21 DIAGNOSIS — S72002D Fracture of unspecified part of neck of left femur, subsequent encounter for closed fracture with routine healing: Secondary | ICD-10-CM | POA: Diagnosis not present

## 2023-12-22 DIAGNOSIS — M545 Low back pain, unspecified: Secondary | ICD-10-CM | POA: Diagnosis not present

## 2023-12-22 DIAGNOSIS — I480 Paroxysmal atrial fibrillation: Secondary | ICD-10-CM | POA: Diagnosis not present

## 2023-12-22 DIAGNOSIS — M109 Gout, unspecified: Secondary | ICD-10-CM | POA: Diagnosis not present

## 2023-12-22 DIAGNOSIS — S72002D Fracture of unspecified part of neck of left femur, subsequent encounter for closed fracture with routine healing: Secondary | ICD-10-CM | POA: Diagnosis not present

## 2023-12-22 DIAGNOSIS — E871 Hypo-osmolality and hyponatremia: Secondary | ICD-10-CM | POA: Diagnosis not present

## 2023-12-22 DIAGNOSIS — R32 Unspecified urinary incontinence: Secondary | ICD-10-CM | POA: Diagnosis not present

## 2023-12-22 DIAGNOSIS — E785 Hyperlipidemia, unspecified: Secondary | ICD-10-CM | POA: Diagnosis not present

## 2023-12-22 DIAGNOSIS — F3341 Major depressive disorder, recurrent, in partial remission: Secondary | ICD-10-CM | POA: Diagnosis not present

## 2023-12-22 DIAGNOSIS — I10 Essential (primary) hypertension: Secondary | ICD-10-CM | POA: Diagnosis not present

## 2023-12-27 DIAGNOSIS — E785 Hyperlipidemia, unspecified: Secondary | ICD-10-CM | POA: Diagnosis not present

## 2023-12-27 DIAGNOSIS — E871 Hypo-osmolality and hyponatremia: Secondary | ICD-10-CM | POA: Diagnosis not present

## 2023-12-27 DIAGNOSIS — F3341 Major depressive disorder, recurrent, in partial remission: Secondary | ICD-10-CM | POA: Diagnosis not present

## 2023-12-27 DIAGNOSIS — I480 Paroxysmal atrial fibrillation: Secondary | ICD-10-CM | POA: Diagnosis not present

## 2023-12-27 DIAGNOSIS — S72002D Fracture of unspecified part of neck of left femur, subsequent encounter for closed fracture with routine healing: Secondary | ICD-10-CM | POA: Diagnosis not present

## 2023-12-27 DIAGNOSIS — I10 Essential (primary) hypertension: Secondary | ICD-10-CM | POA: Diagnosis not present

## 2023-12-27 DIAGNOSIS — R32 Unspecified urinary incontinence: Secondary | ICD-10-CM | POA: Diagnosis not present

## 2023-12-27 DIAGNOSIS — M545 Low back pain, unspecified: Secondary | ICD-10-CM | POA: Diagnosis not present

## 2023-12-27 DIAGNOSIS — M109 Gout, unspecified: Secondary | ICD-10-CM | POA: Diagnosis not present

## 2023-12-28 DIAGNOSIS — M545 Low back pain, unspecified: Secondary | ICD-10-CM | POA: Diagnosis not present

## 2023-12-28 DIAGNOSIS — E785 Hyperlipidemia, unspecified: Secondary | ICD-10-CM | POA: Diagnosis not present

## 2023-12-28 DIAGNOSIS — I480 Paroxysmal atrial fibrillation: Secondary | ICD-10-CM | POA: Diagnosis not present

## 2023-12-28 DIAGNOSIS — S72002D Fracture of unspecified part of neck of left femur, subsequent encounter for closed fracture with routine healing: Secondary | ICD-10-CM | POA: Diagnosis not present

## 2023-12-28 DIAGNOSIS — I10 Essential (primary) hypertension: Secondary | ICD-10-CM | POA: Diagnosis not present

## 2023-12-28 DIAGNOSIS — E871 Hypo-osmolality and hyponatremia: Secondary | ICD-10-CM | POA: Diagnosis not present

## 2023-12-28 DIAGNOSIS — F3341 Major depressive disorder, recurrent, in partial remission: Secondary | ICD-10-CM | POA: Diagnosis not present

## 2023-12-28 DIAGNOSIS — R32 Unspecified urinary incontinence: Secondary | ICD-10-CM | POA: Diagnosis not present

## 2023-12-28 DIAGNOSIS — M109 Gout, unspecified: Secondary | ICD-10-CM | POA: Diagnosis not present

## 2024-01-02 DIAGNOSIS — M545 Low back pain, unspecified: Secondary | ICD-10-CM | POA: Diagnosis not present

## 2024-01-02 DIAGNOSIS — S72002D Fracture of unspecified part of neck of left femur, subsequent encounter for closed fracture with routine healing: Secondary | ICD-10-CM | POA: Diagnosis not present

## 2024-01-02 DIAGNOSIS — M109 Gout, unspecified: Secondary | ICD-10-CM | POA: Diagnosis not present

## 2024-01-02 DIAGNOSIS — I10 Essential (primary) hypertension: Secondary | ICD-10-CM | POA: Diagnosis not present

## 2024-01-02 DIAGNOSIS — R32 Unspecified urinary incontinence: Secondary | ICD-10-CM | POA: Diagnosis not present

## 2024-01-02 DIAGNOSIS — I480 Paroxysmal atrial fibrillation: Secondary | ICD-10-CM | POA: Diagnosis not present

## 2024-01-02 DIAGNOSIS — E785 Hyperlipidemia, unspecified: Secondary | ICD-10-CM | POA: Diagnosis not present

## 2024-01-02 DIAGNOSIS — E871 Hypo-osmolality and hyponatremia: Secondary | ICD-10-CM | POA: Diagnosis not present

## 2024-01-02 DIAGNOSIS — F3341 Major depressive disorder, recurrent, in partial remission: Secondary | ICD-10-CM | POA: Diagnosis not present

## 2024-01-04 DIAGNOSIS — I10 Essential (primary) hypertension: Secondary | ICD-10-CM | POA: Diagnosis not present

## 2024-01-04 DIAGNOSIS — I480 Paroxysmal atrial fibrillation: Secondary | ICD-10-CM | POA: Diagnosis not present

## 2024-01-04 DIAGNOSIS — E871 Hypo-osmolality and hyponatremia: Secondary | ICD-10-CM | POA: Diagnosis not present

## 2024-01-04 DIAGNOSIS — E785 Hyperlipidemia, unspecified: Secondary | ICD-10-CM | POA: Diagnosis not present

## 2024-01-04 DIAGNOSIS — M545 Low back pain, unspecified: Secondary | ICD-10-CM | POA: Diagnosis not present

## 2024-01-04 DIAGNOSIS — F3341 Major depressive disorder, recurrent, in partial remission: Secondary | ICD-10-CM | POA: Diagnosis not present

## 2024-01-04 DIAGNOSIS — R32 Unspecified urinary incontinence: Secondary | ICD-10-CM | POA: Diagnosis not present

## 2024-01-04 DIAGNOSIS — M109 Gout, unspecified: Secondary | ICD-10-CM | POA: Diagnosis not present

## 2024-01-04 DIAGNOSIS — S72002D Fracture of unspecified part of neck of left femur, subsequent encounter for closed fracture with routine healing: Secondary | ICD-10-CM | POA: Diagnosis not present

## 2024-01-09 DIAGNOSIS — S72002D Fracture of unspecified part of neck of left femur, subsequent encounter for closed fracture with routine healing: Secondary | ICD-10-CM | POA: Diagnosis not present

## 2024-01-09 DIAGNOSIS — R32 Unspecified urinary incontinence: Secondary | ICD-10-CM | POA: Diagnosis not present

## 2024-01-09 DIAGNOSIS — F3341 Major depressive disorder, recurrent, in partial remission: Secondary | ICD-10-CM | POA: Diagnosis not present

## 2024-01-09 DIAGNOSIS — I480 Paroxysmal atrial fibrillation: Secondary | ICD-10-CM | POA: Diagnosis not present

## 2024-01-09 DIAGNOSIS — M109 Gout, unspecified: Secondary | ICD-10-CM | POA: Diagnosis not present

## 2024-01-09 DIAGNOSIS — E785 Hyperlipidemia, unspecified: Secondary | ICD-10-CM | POA: Diagnosis not present

## 2024-01-09 DIAGNOSIS — E871 Hypo-osmolality and hyponatremia: Secondary | ICD-10-CM | POA: Diagnosis not present

## 2024-01-09 DIAGNOSIS — M545 Low back pain, unspecified: Secondary | ICD-10-CM | POA: Diagnosis not present

## 2024-01-09 DIAGNOSIS — I10 Essential (primary) hypertension: Secondary | ICD-10-CM | POA: Diagnosis not present

## 2024-01-10 DIAGNOSIS — E785 Hyperlipidemia, unspecified: Secondary | ICD-10-CM | POA: Diagnosis not present

## 2024-01-10 DIAGNOSIS — R32 Unspecified urinary incontinence: Secondary | ICD-10-CM | POA: Diagnosis not present

## 2024-01-10 DIAGNOSIS — M545 Low back pain, unspecified: Secondary | ICD-10-CM | POA: Diagnosis not present

## 2024-01-10 DIAGNOSIS — F3341 Major depressive disorder, recurrent, in partial remission: Secondary | ICD-10-CM | POA: Diagnosis not present

## 2024-01-10 DIAGNOSIS — I10 Essential (primary) hypertension: Secondary | ICD-10-CM | POA: Diagnosis not present

## 2024-01-10 DIAGNOSIS — M109 Gout, unspecified: Secondary | ICD-10-CM | POA: Diagnosis not present

## 2024-01-10 DIAGNOSIS — I480 Paroxysmal atrial fibrillation: Secondary | ICD-10-CM | POA: Diagnosis not present

## 2024-01-10 DIAGNOSIS — E871 Hypo-osmolality and hyponatremia: Secondary | ICD-10-CM | POA: Diagnosis not present

## 2024-01-10 DIAGNOSIS — S72002D Fracture of unspecified part of neck of left femur, subsequent encounter for closed fracture with routine healing: Secondary | ICD-10-CM | POA: Diagnosis not present

## 2024-01-14 DIAGNOSIS — E871 Hypo-osmolality and hyponatremia: Secondary | ICD-10-CM | POA: Diagnosis not present

## 2024-01-14 DIAGNOSIS — F3341 Major depressive disorder, recurrent, in partial remission: Secondary | ICD-10-CM | POA: Diagnosis not present

## 2024-01-14 DIAGNOSIS — S72002D Fracture of unspecified part of neck of left femur, subsequent encounter for closed fracture with routine healing: Secondary | ICD-10-CM | POA: Diagnosis not present

## 2024-01-14 DIAGNOSIS — R32 Unspecified urinary incontinence: Secondary | ICD-10-CM | POA: Diagnosis not present

## 2024-01-14 DIAGNOSIS — M545 Low back pain, unspecified: Secondary | ICD-10-CM | POA: Diagnosis not present

## 2024-01-14 DIAGNOSIS — E785 Hyperlipidemia, unspecified: Secondary | ICD-10-CM | POA: Diagnosis not present

## 2024-01-14 DIAGNOSIS — I480 Paroxysmal atrial fibrillation: Secondary | ICD-10-CM | POA: Diagnosis not present

## 2024-01-14 DIAGNOSIS — M109 Gout, unspecified: Secondary | ICD-10-CM | POA: Diagnosis not present

## 2024-01-14 DIAGNOSIS — I10 Essential (primary) hypertension: Secondary | ICD-10-CM | POA: Diagnosis not present

## 2024-01-23 DIAGNOSIS — F3341 Major depressive disorder, recurrent, in partial remission: Secondary | ICD-10-CM | POA: Diagnosis not present

## 2024-01-23 DIAGNOSIS — E871 Hypo-osmolality and hyponatremia: Secondary | ICD-10-CM | POA: Diagnosis not present

## 2024-01-23 DIAGNOSIS — E785 Hyperlipidemia, unspecified: Secondary | ICD-10-CM | POA: Diagnosis not present

## 2024-01-23 DIAGNOSIS — I10 Essential (primary) hypertension: Secondary | ICD-10-CM | POA: Diagnosis not present

## 2024-01-23 DIAGNOSIS — M545 Low back pain, unspecified: Secondary | ICD-10-CM | POA: Diagnosis not present

## 2024-01-23 DIAGNOSIS — M109 Gout, unspecified: Secondary | ICD-10-CM | POA: Diagnosis not present

## 2024-01-23 DIAGNOSIS — R32 Unspecified urinary incontinence: Secondary | ICD-10-CM | POA: Diagnosis not present

## 2024-01-23 DIAGNOSIS — S72002D Fracture of unspecified part of neck of left femur, subsequent encounter for closed fracture with routine healing: Secondary | ICD-10-CM | POA: Diagnosis not present

## 2024-01-23 DIAGNOSIS — I480 Paroxysmal atrial fibrillation: Secondary | ICD-10-CM | POA: Diagnosis not present

## 2024-01-30 DIAGNOSIS — S72143A Displaced intertrochanteric fracture of unspecified femur, initial encounter for closed fracture: Secondary | ICD-10-CM | POA: Insufficient documentation

## 2024-02-07 ENCOUNTER — Telehealth: Payer: Self-pay

## 2024-02-07 NOTE — Telephone Encounter (Signed)
 Copied from CRM [id number]. Topic: Appointments - Appointment Scheduling >> Feb 07, 2024 11:30 AM Hadassah PARAS wrote: Patient/patient representative is calling to schedule an appointment. Refer to attachments for appointment information.  Feb 23 2024 10:40 AM - New Office Visit [facility] - Cleatus Debby Specking, MD    ----------------------------------------------------------------------- From previous Reason for Contact - Transfer of Care: Pt is requesting to transfer FROM: Cleatus Debby Specking, MD Pt is requesting to transfer TO: Cleatus Debby Specking, MD Reason for requested transfer: PCP has retired It is the responsibility of the team the patient would like to transfer to (Dr. ) to reach out to the patient if for any reason this transfer is not acceptable.

## 2024-02-21 DIAGNOSIS — H5789 Other specified disorders of eye and adnexa: Secondary | ICD-10-CM | POA: Insufficient documentation

## 2024-02-21 DIAGNOSIS — M1990 Unspecified osteoarthritis, unspecified site: Secondary | ICD-10-CM | POA: Insufficient documentation

## 2024-02-21 DIAGNOSIS — Z79899 Other long term (current) drug therapy: Secondary | ICD-10-CM | POA: Insufficient documentation

## 2024-02-21 DIAGNOSIS — E66811 Obesity, class 1: Secondary | ICD-10-CM | POA: Insufficient documentation

## 2024-02-21 DIAGNOSIS — L659 Nonscarring hair loss, unspecified: Secondary | ICD-10-CM | POA: Insufficient documentation

## 2024-02-21 DIAGNOSIS — M2550 Pain in unspecified joint: Secondary | ICD-10-CM | POA: Insufficient documentation

## 2024-02-21 DIAGNOSIS — K760 Fatty (change of) liver, not elsewhere classified: Secondary | ICD-10-CM | POA: Insufficient documentation

## 2024-02-21 DIAGNOSIS — E790 Hyperuricemia without signs of inflammatory arthritis and tophaceous disease: Secondary | ICD-10-CM | POA: Insufficient documentation

## 2024-02-21 DIAGNOSIS — M069 Rheumatoid arthritis, unspecified: Secondary | ICD-10-CM | POA: Insufficient documentation

## 2024-02-21 DIAGNOSIS — R5383 Other fatigue: Secondary | ICD-10-CM | POA: Insufficient documentation

## 2024-02-21 DIAGNOSIS — M1991 Primary osteoarthritis, unspecified site: Secondary | ICD-10-CM | POA: Insufficient documentation

## 2024-02-21 DIAGNOSIS — M48061 Spinal stenosis, lumbar region without neurogenic claudication: Secondary | ICD-10-CM | POA: Insufficient documentation

## 2024-02-23 ENCOUNTER — Encounter: Payer: Self-pay | Admitting: Student in an Organized Health Care Education/Training Program

## 2024-02-23 ENCOUNTER — Ambulatory Visit (INDEPENDENT_AMBULATORY_CARE_PROVIDER_SITE_OTHER): Admitting: Student in an Organized Health Care Education/Training Program

## 2024-02-23 VITALS — BP 116/64 | HR 55 | Wt 175.0 lb

## 2024-02-23 DIAGNOSIS — M069 Rheumatoid arthritis, unspecified: Secondary | ICD-10-CM | POA: Diagnosis not present

## 2024-02-23 DIAGNOSIS — M810 Age-related osteoporosis without current pathological fracture: Secondary | ICD-10-CM | POA: Insufficient documentation

## 2024-02-23 DIAGNOSIS — I480 Paroxysmal atrial fibrillation: Secondary | ICD-10-CM | POA: Diagnosis not present

## 2024-02-23 DIAGNOSIS — I10 Essential (primary) hypertension: Secondary | ICD-10-CM | POA: Diagnosis not present

## 2024-02-23 DIAGNOSIS — F39 Unspecified mood [affective] disorder: Secondary | ICD-10-CM | POA: Insufficient documentation

## 2024-02-23 DIAGNOSIS — E7849 Other hyperlipidemia: Secondary | ICD-10-CM | POA: Diagnosis not present

## 2024-02-23 DIAGNOSIS — G4733 Obstructive sleep apnea (adult) (pediatric): Secondary | ICD-10-CM

## 2024-02-23 DIAGNOSIS — F333 Major depressive disorder, recurrent, severe with psychotic symptoms: Secondary | ICD-10-CM | POA: Insufficient documentation

## 2024-02-23 MED ORDER — APIXABAN 5 MG PO TABS: 5.0000 mg | ORAL_TABLET | Freq: Two times a day (BID) | ORAL | 5 refills | Status: AC

## 2024-02-23 NOTE — Assessment & Plan Note (Signed)
 Chronic and stable. Major depressive disorder is managed with lamotrigine  and escitalopram . The current regimen is effective. Continue lamotrigine  and escitalopram  as prescribed.

## 2024-02-23 NOTE — Patient Instructions (Signed)
  VISIT SUMMARY: Today, we reviewed your medications and managed your chronic conditions, including rheumatoid arthritis, spinal stenosis, atrial fibrillation, and others. We also discussed your recent hip fracture and progress in rehabilitation.  YOUR PLAN: -LEFT HIP FRACTURE, POST-SURGICAL: You are recovering from a left hip fracture that was treated with surgery. Continue your home therapy exercises to improve mobility and consider a life alert system for added safety.  -ATRIAL FIBRILLATION: Atrial fibrillation is an irregular and often rapid heart rate. We refilled your Eliquis  prescription and you should continue taking diltiazem  as needed for heart rate control.  -RHEUMATOID ARTHRITIS: Rheumatoid arthritis is an autoimmune disease that causes joint inflammation. Continue taking methotrexate and folic acid  as prescribed. Follow up with Dr. Lavell regarding the delivery issues with Enbrel.  -SPINAL STENOSIS, LUMBAR REGION: Spinal stenosis is a narrowing of the spaces within your spine, which can cause back pain. Continue taking tizanidine  as needed for back pain.  -MAJOR DEPRESSIVE DISORDER: Major depressive disorder is a mental health condition characterized by persistent feelings of sadness and loss of interest. Continue taking lamotrigine  and escitalopram  as prescribed.  -OBSTRUCTIVE SLEEP APNEA: Obstructive sleep apnea is a condition where your breathing stops and starts during sleep. You are not using CPAP due to discomfort and mask fit issues.  -ESSENTIAL HYPERTENSION: Essential hypertension is high blood pressure with no identifiable cause. Continue taking hydrochlorothiazide  and metoprolol  as prescribed.  -HYPERLIPIDEMIA: Hyperlipidemia is having high levels of fats in your blood. Continue taking atorvastatin  as prescribed.  INSTRUCTIONS: Please follow up with Dr. Lavell regarding the Enbrel delivery issues. Continue your home therapy exercises for your hip rehabilitation and consider  a life alert system for added safety. Refill your Eliquis  prescription and continue taking your other medications as prescribed.

## 2024-02-23 NOTE — Assessment & Plan Note (Signed)
 Hyperlipidemia is managed with atorvastatin . There is no history of cardiovascular events. Continue atorvastatin  as prescribed.

## 2024-02-23 NOTE — Assessment & Plan Note (Signed)
 Chronic and stable.  Managed with rheumatology.  Currently using methotrexate with folic acid .  Also listed is using Enbrel, there is some confusion as she does not think she is using this medicine.  She is gena follow-up with Dr. Ishmael soon to clarify.  Also planning for lab work for safety monitoring during that visit, usually gets it every 3 months.  Will get records.

## 2024-02-23 NOTE — Assessment & Plan Note (Signed)
 Diagnosed with obstructive sleep apnea. She is not using CPAP due to discomfort and mask fit issues.

## 2024-02-23 NOTE — Progress Notes (Signed)
 New Patient Office Visit  Patient ID: Alexandra [name], Female   DOB: Aug 30, 1945 78 y.o. MRN: [id number]  Chief Complaint  Patient presents with   Annual Exam   Establish Care   Subjective:     ERLENE [name] presents to establish care  HPI  Discussed the use of AI scribe software for clinical note transcription with the patient, who gave verbal consent to proceed.  History of Present Illness Alexandra [name] is a 78 year old female with rheumatoid arthritis, spinal stenosis, and atrial fibrillation who presents for medication review and management of her chronic conditions.  She has rheumatoid arthritis and is currently using methotrexate injections along with folic acid . She was previously on Enbrel (etanercept) but has not received it this year.  She has spinal stenosis and uses tizanidine  as needed for back pain, though she reports infrequent use and has completed a bottle of the prescription.  She has atrial fibrillation and is on Eliquis , but is currently out of it. She also takes atorvastatin  for cholesterol, diltiazem  as needed for heart rate control, hydrochlorothiazide  for blood pressure, and metoprolol  50 mg twice a day. She no longer uses losartan .  She experienced a hip fracture at the end of July and underwent surgery with rod and pin placement. She completed four weeks of rehabilitation and now uses a cane, having progressed from a walker. No falls since the fracture, and she has resumed driving and performing daily activities independently.  She has a history of depression managed with lamotrigine  and escitalopram , which are working well together. She previously used Prozac but switched due to decreased efficacy.  She has been diagnosed with sleep apnea but no longer uses CPAP due to discomfort and mask fit issues. She has lost weight since her hip fracture and previously used Ozempic for weight management, which she stopped during rehab. She has some remaining doses  but is unsure of their viability.  Her family history includes lung cancer in her sister and testicular cancer in her brother. 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 She quit smoking in the past and has had multiple surgeries including knee replacements, gallbladder removal, and carpal tunnel surgeries.   Outpatient Encounter Medications as of 02/23/2024  Medication Sig   albuterol  (VENTOLIN  HFA) 108 (90 Base) MCG/ACT inhaler Inhale 1-2 puffs into the lungs every 6 (six) hours as needed for wheezing or shortness of breath.   apixaban  (ELIQUIS ) 5 MG TABS tablet Take 1 tablet (5 mg total) by mouth 2 (two) times daily.   atorvastatin  (LIPITOR) 20 MG tablet Take 20 mg by mouth daily.   Coenzyme Q10 10 MG capsule Take 10 mg by mouth daily.   diltiazem  (CARDIZEM ) 30 MG tablet Take 1 tablet (30 mg total) by mouth 4 (four) times daily as needed.   ENBREL SURECLICK 50 MG/ML injection Inject 50 mg into the skin once a week. Saturday   escitalopram  (LEXAPRO ) 20 MG tablet Take 20 mg by mouth daily.   Ferrous Sulfate (FEROSUL PO) Take 1 tablet by mouth daily.   folic acid  (FOLVITE ) 1 MG tablet Take 1 mg by mouth daily.   hydrochlorothiazide  (HYDRODIURIL ) 25 MG tablet Take 25 mg by mouth daily.   Insulin Syringe-Needle U-100 (B-D INS SYR MICROFINE 1CC/27G) 27G X 5/8 1 ML MISC once a week for MTX for 84 days   lamoTRIgine  (LAMICTAL ) 150 MG tablet Take 150 mg by mouth daily.  methotrexate (50 MG/ML) 1 g injection Inject 20 mg into the vein once a week. SQ   metoprolol  tartrate (LOPRESSOR ) 50 MG tablet TAKE 1 TABLET TWICE DAILY   nitroGLYCERIN  (NITROSTAT ) 0.4 MG SL tablet Place 1 tablet (0.4 mg total) under the tongue every 5 (five) minutes x 3 doses as needed for chest pain (If no relief after 3rd dose, call 911 or go to ED.).   pantoprazole  (PROTONIX ) 40 MG tablet Take 1 tablet (40 mg total) by mouth daily. Take  30-60 min before first meal of the day   [DISCONTINUED] apixaban  (ELIQUIS ) 5 MG TABS tablet Take 1 tablet (5 mg total) by mouth 2 (two) times daily.   [DISCONTINUED] famotidine  (PEPCID ) 20 MG tablet One after supper (Patient not taking: Reported on 11/19/2023)   [DISCONTINUED] losartan  (COZAAR ) 25 MG tablet Take 1 tablet (25 mg total) by mouth as directed. hold losartan  for 2 weeks, if your top blood pressure number is less than 140, stop losartan . If your top blood pressure number is greater than 140 mg, restart losartan  at 25 mg daily (Patient not taking: Reported on 11/19/2023)   [DISCONTINUED] oxyCODONE -acetaminophen  (PERCOCET/ROXICET) 5-325 MG tablet Take 1 tablet by mouth every 4 (four) hours as needed for severe pain (pain score 7-10) (post op pain).   [DISCONTINUED] tiZANidine  (ZANAFLEX ) 2 MG tablet Take 1 tablet (2 mg total) by mouth every 6 (six) hours as needed for muscle spasms.   No facility-administered encounter medications on file as of 02/23/2024.    Past Medical History:  Diagnosis Date   Depression    Gout    High cholesterol    Hip fracture (HCC) 11/19/2023   Hypertension     Past Surgical History:  Procedure Laterality Date   CARPAL TUNNEL RELEASE     CHOLECYSTECTOMY     FEMUR IM NAIL Left 11/20/2023   Procedure: INSERTION, INTRAMEDULLARY ROD, FEMUR;  Surgeon: Sheril Coy, MD;  Location: [facility];  Service: Orthopedics;  Laterality: Left;   REPLACEMENT TOTAL KNEE      Family History  Problem Relation Age of Onset   Heart attack Mother    Pulmonary embolism Father    Cancer Sister    Coronary artery disease Brother    Stroke Maternal Grandmother    Cancer Paternal Grandfather     Social History   Socioeconomic History   Marital status: Widowed    Spouse name: Not on file   Number of children: Not on file   Years of education: Not on file   Highest education level: Not on file  Occupational History   Not on file  Tobacco Use   Smoking status: Never    Smokeless tobacco: Never  Vaping Use   Vaping status: Never Used  Substance and Sexual Activity   Alcohol use: Not Currently    Comment: rarely   Drug use: Never   Sexual activity: Not on file  Other Topics Concern   Not on file  Social History Narrative   Not on file   Social Drivers of Health   Financial Resource Strain: Not on file  Food Insecurity: Unknown (11/19/2023)   Hunger Vital Sign    Worried About Running Out of Food in the Last Year: Not on file    Ran Out of Food in the Last Year: Never true  Transportation Needs: No Transportation Needs (11/19/2023)   PRAPARE - Administrator, Civil Service (Medical): No    Lack of Transportation (Non-Medical): No  Physical Activity:  Not on file  Stress: Not on file  Social Connections: Moderately Integrated (11/19/2023)   Social Connection and Isolation Panel    Frequency of Communication with Friends and Family: Three times a week    Frequency of Social Gatherings with Friends and Family: Three times a week    Attends Religious Services: More than 4 times per year    Active Member of Clubs or Organizations: Yes    Attends Banker Meetings: More than 4 times per year    Marital Status: Widowed  Intimate Partner Violence: Not At Risk (11/19/2023)   Humiliation, Afraid, Rape, and Kick questionnaire    Fear of Current or Ex-Partner: No    Emotionally Abused: No    Physically Abused: No    Sexually Abused: No    ROS    Objective:    BP 116/64   Pulse (!) 55   Wt 175 lb (79.4 kg)   SpO2 99%   BMI 33.07 kg/m   Physical Exam  Gen: Well-appearing woman, moderate frailty advanced age Eyes: Normal Ears: Normal hearing, normal tympanic membranes bilaterally Neck: Normal thyroid , no nodules or adenopathy Heart: Regular, 3 out of 6 early systolic murmur best heard at the right upper sternal border Lungs: Unlabored, clear throughout Abd: Soft, nontender, no organomegaly, no hernia Ext: Warm, no  edema Neuro: Alert, conversational, moderately delayed get up and go, one-person assistance to get onto the table, ambulates with a cane, full strength upper and lower extremities Psych: Appropriate mood and affect, not anxious or depressed appearing      Assessment & Plan:   Problem List Items Addressed This Visit       High   Paroxysmal atrial fibrillation (HCC) (Chronic)   Chronic and stable.  Managed with cardiology.  Listed as paroxysmal atrial fibrillation.  She uses rate control with metoprolol  50 mg twice daily and has as needed diltiazem  for a sensation of palpitations.  No history of cerebral infarction, was using apixaban  for primary prevention, ran out of this medicine a few weeks ago.  I have refilled it at 5 mg twice daily dosing based on her age and weight.  She has no complications of bleeding.      Relevant Medications   apixaban  (ELIQUIS ) 5 MG TABS tablet   Rheumatoid arthritis (HCC) (Chronic)   Chronic and stable.  Managed with rheumatology.  Currently using methotrexate with folic acid .  Also listed is using Enbrel, there is some confusion as she does not think she is using this medicine.  She is gena follow-up with Dr. Ishmael soon to clarify.  Also planning for lab work for safety monitoring during that visit, usually gets it every 3 months.  Will get records.      Mood disorder   Chronic and stable. Major depressive disorder is managed with lamotrigine  and escitalopram . The current regimen is effective. Continue lamotrigine  and escitalopram  as prescribed.        Medium    HTN (hypertension) - Primary (Chronic)   Chronic and stable. Essential hypertension is managed with hydrochlorothiazide  and metoprolol . Continue hydrochlorothiazide  and metoprolol  as prescribed.      Relevant Medications   apixaban  (ELIQUIS ) 5 MG TABS tablet   HLD (hyperlipidemia) (Chronic)   Hyperlipidemia is managed with atorvastatin . There is no history of cardiovascular events. Continue  atorvastatin  as prescribed.      Relevant Medications   apixaban  (ELIQUIS ) 5 MG TABS tablet   Osteoporosis (Chronic)   Still recovering from a fragility fracture  of the left hip after a fall in July.  Repaired surgically.  Need to obtain records from last PCP regarding prior therapies for osteoporosis, as well as vitamin D levels.  Would like to start some kind of osteoporosis treatment        Low   OSA (obstructive sleep apnea) (Chronic)   Diagnosed with obstructive sleep apnea. She is not using CPAP due to discomfort and mask fit issues.       Return in about 3 months (around 05/25/2024).   Cleatus Debby Specking, MD [facility]

## 2024-02-23 NOTE — Assessment & Plan Note (Signed)
 Still recovering from a fragility fracture of the left hip after a fall in July.  Repaired surgically.  Need to obtain records from last PCP regarding prior therapies for osteoporosis, as well as vitamin D levels.  Would like to start some kind of osteoporosis treatment

## 2024-02-23 NOTE — Assessment & Plan Note (Addendum)
 Chronic and stable.  Managed with cardiology.  Listed as paroxysmal atrial fibrillation.  She uses rate control with metoprolol  50 mg twice daily and has as needed diltiazem  for a sensation of palpitations.  No history of cerebral infarction, was using apixaban  for primary prevention, ran out of this medicine a few weeks ago.  I have refilled it at 5 mg twice daily dosing based on her age and weight.  She has no complications of bleeding.

## 2024-02-23 NOTE — Assessment & Plan Note (Signed)
 Chronic and stable. Essential hypertension is managed with hydrochlorothiazide  and metoprolol . Continue hydrochlorothiazide  and metoprolol  as prescribed.

## 2024-02-27 ENCOUNTER — Other Ambulatory Visit: Payer: Self-pay | Admitting: Student in an Organized Health Care Education/Training Program

## 2024-02-27 LAB — LAB REPORT - SCANNED: Microalb Creat Ratio: <30

## 2024-02-27 NOTE — Telephone Encounter (Unsigned)
 Copied from CRM #[id number]. Topic: Clinical - Medication Refill >> Feb 27, 2024 10:34 AM Nessti S wrote: Medication: metoprolol  tartrate (LOPRESSOR ) 50 MG tablet  Has the patient contacted their pharmacy? No (Agent: If no, request that the patient contact the pharmacy for the refill. If patient does not wish to contact the pharmacy document the reason why and proceed with request.) (Agent: If yes, when and what did the pharmacy advise?)  This is the patient's preferred pharmacy:  [facility] - [place], [place] - [address] [place] [address] Phone: [phone] Fax: [phone]  Is this the correct pharmacy for this prescription? Yes If no, delete pharmacy and type the correct one.   Has the prescription been filled recently? Yes  Is the patient out of the medication? Yes  Has the patient been seen for an appointment in the last year OR does the patient have an upcoming appointment? Yes  Can we respond through MyChart? Yes  Agent: Please be advised that Rx refills may take up to 3 business days. We ask that you follow-up with your pharmacy.

## 2024-02-28 MED ORDER — METOPROLOL TARTRATE 50 MG PO TABS: 50.0000 mg | ORAL_TABLET | Freq: Two times a day (BID) | ORAL | 5 refills | Status: AC

## 2024-02-28 NOTE — Telephone Encounter (Signed)
 Okay to refill under your name?

## 2024-03-06 ENCOUNTER — Other Ambulatory Visit: Payer: Self-pay

## 2024-03-06 MED ORDER — FOLIC ACID 1 MG PO TABS: 1.0000 mg | ORAL_TABLET | Freq: Every day | ORAL | 3 refills | Status: AC

## 2024-03-06 NOTE — Telephone Encounter (Signed)
 Received a refill request for folic acid  but I see that Dr. Jerrell has not sent that in to the pharmacy for her before. Please advise.

## 2024-03-12 DIAGNOSIS — M47816 Spondylosis without myelopathy or radiculopathy, lumbar region: Secondary | ICD-10-CM | POA: Insufficient documentation

## 2024-03-15 ENCOUNTER — Other Ambulatory Visit: Payer: Self-pay | Admitting: Student in an Organized Health Care Education/Training Program

## 2024-03-15 ENCOUNTER — Telehealth (HOSPITAL_BASED_OUTPATIENT_CLINIC_OR_DEPARTMENT_OTHER): Payer: Self-pay | Admitting: *Deleted

## 2024-03-15 NOTE — Telephone Encounter (Signed)
 Requested Prescriptions   Pending Prescriptions Disp Refills   ALPRAZolam  (XANAX ) 0.5 MG tablet [Pharmacy Med Name: ALPRAZOLAM  0.5 MG TAB] 90 tablet     Sig: TAKE ONE TABLET BY MOUTH THREE TIMES DAILY AS NEEDED     Date of patient request: 03/15/2024 Last office visit: 02/23/2024 Upcoming visit: 05/25/2023

## 2024-03-15 NOTE — Telephone Encounter (Signed)
   Pre-operative Risk Assessment    Patient Name: Alexandra [name]  DOB: 1945/08/24 MRN: [id number]   Date of last office visit: 10/04/22 DR. MALLIPEDDI Date of next office visit: 04/05/24 DR. MALLIPEDDI- F/U APPT    Request for Surgical Clearance    Procedure:  LUMBAR EPIDURAL INJECTION  Date of Surgery:  Clearance TBD                                Surgeon:  DR. SCOT FLATTEN Surgeon's Group or Practice Name:  [facility] Phone number:  [phone] ANNITTA DARNEL, CMA Fax number:  [phone]   Type of Clearance Requested:   - Medical  - Pharmacy:  Hold Apixaban  (Eliquis )     Type of Anesthesia:  Not Indicated   Additional requests/questions:    Bonney Niels Jest   03/15/2024, 11:15 AM

## 2024-03-15 NOTE — Telephone Encounter (Signed)
 OK to dispense under your name?

## 2024-03-16 NOTE — Telephone Encounter (Signed)
 I called and left a voicemail for the patient's.  Need to get more information about the alprazolam .  This was not discussed at our most recent visit.  It was being managed by Dr. Bertell, looks like it was recommended that this medicine be tapered/discontinued after hospitalization in July resulting in a hip fracture.  Want talk with her more about this.

## 2024-03-20 NOTE — Telephone Encounter (Signed)
   Name: Alexandra [name]  DOB: [birth year] / MRN: [id number]  Primary Cardiologist: Vishnu P Mallipeddi, MD  Chart reviewed as part of pre-operative protocol coverage. The patient has an upcoming visit scheduled with Dr. Mallipeddi on 04/05/2024 at which time clearance can be addressed in case there are any issues that would impact surgical recommendations.  Lumbar Epidural Injection is not scheduled until TBD as below. I added preop FYI to appointment note so that provider is aware to address at time of outpatient visit.  Per office protocol the cardiology provider should forward their finalized clearance decision and recommendations regarding antiplatelet therapy to the requesting party below.    This message will also be routed to pharmacy pool for input on holding  as requested below so that this information is available to the clearing provider at time of patient's appointment.   I will route this message as FYI to requesting party and remove this message from the preop box as separate preop APP input not needed at this time.   Please call with any questions.  Damien JAYSON Braver, NP  03/20/2024, 11:02 AM

## 2024-03-20 NOTE — Telephone Encounter (Signed)
 And staff follow-up with the patient about the questions I had about the Xanax  prescription?  Would like to see the patient sooner than January so we can have a long-term plan on this medication.  Wanted check in to make sure she is not having withdrawal symptoms if she is out of this medicine.

## 2024-03-20 NOTE — Telephone Encounter (Signed)
Called patient and left vm to return call.

## 2024-03-20 NOTE — Telephone Encounter (Signed)
 Patient with diagnosis of afib on Eliquis  for anticoagulation.    Procedure:  LUMBAR EPIDURAL INJECTION  Date of procedure: TBD   CHA2DS2-VASc Score = 4   This indicates a 4.8% annual risk of stroke. The patient's score is based upon: CHF History: 0 HTN History: 1 Diabetes History: 0 Stroke History: 0 Vascular Disease History: 0 Age Score: 2 Gender Score: 1      CrCl 48 ml/min Platelet count 243  Patient has not had an Afib/aflutter ablation in the last 3 months, DCCV within the last 4 weeks or a watchman implanted in the last 45 days   Per office protocol, patient can hold Eliquis  for 3 days prior to procedure.    **This guidance is not considered finalized until pre-operative APP has relayed final recommendations.**

## 2024-03-26 NOTE — Telephone Encounter (Unsigned)
 Copied from CRM #[id number]. Topic: General - Call Back - No Documentation >> Mar 26, 2024  4:43 PM Rea ORN wrote: Reason for CRM: pt missed a call from the clinic.  Please call back [phone]

## 2024-03-27 ENCOUNTER — Ambulatory Visit: Admitting: Student in an Organized Health Care Education/Training Program

## 2024-03-27 ENCOUNTER — Encounter: Payer: Self-pay | Admitting: Student in an Organized Health Care Education/Training Program

## 2024-03-27 VITALS — BP 160/95 | HR 74 | Ht 61.0 in | Wt 166.2 lb

## 2024-03-27 DIAGNOSIS — F39 Unspecified mood [affective] disorder: Secondary | ICD-10-CM

## 2024-03-27 MED ORDER — ALPRAZOLAM 0.5 MG PO TABS: 0.5000 mg | ORAL_TABLET | Freq: Every evening | ORAL | 0 refills | Status: DC | PRN

## 2024-03-27 NOTE — Progress Notes (Signed)
 Acute Office Visit  Patient ID: Alexandra [name], female    DOB: 12-Sep-1945, 78 y.o.   MRN: [id number]  PCP: Jerrell Cleatus Ned, MD  Chief Complaint  Patient presents with   Anxiety    Patient would like a refill on her xanax .     Subjective:     HPI  Discussed the use of AI scribe software for clinical note transcription with the patient, who gave verbal consent to proceed.  History of Present Illness Alexandra [name] is a 78 year old female who presents for follow-up regarding Xanax  use.  She has been using Xanax  for approximately five years to manage tension and insomnia. She takes one and a half 0.5 mg tablets at night to aid sleep and occasionally takes additional doses during the day as needed for stress, particularly during family-related stress. She does not consistently take the medication daily, sometimes going days without it, but has recently been using it more regularly at night since returning from hip therapy about two months ago. She was previously prescribed 90 tablets per month, which she did not always use up, leading to leftover medication. She has about 20 to 30 tablets remaining at home.  She recently experienced a hip fracture, which required surgery. She attributes the fall to tripping over a rug rather than medication effects. After her hip surgery, she recalls that hospital doctors discussed stopping Xanax , but she resumed its use to manage sleep issues.  She discusses her use of Ozempic for weight loss, which she received for free through a program set up by a provider named Scott Garlin. She experienced significant nausea and vomiting after resuming the medication following a four-week hiatus. She is uncertain about the dosage she was taking but recalls being moved from a lower to a higher dose before the break.  She is a widow living alone on [address].      Objective:    BP (!) 160/95 (BP Location: Left Arm, Patient Position: Sitting, Cuff  Size: Large)   Pulse 74   Ht 5' 1 (1.549 m)   Wt 166 lb 3.2 oz (75.4 kg)   BMI 31.40 kg/m   Physical Exam  Gen: Frail appearing older woman Psych: Appropriate mood and affect, not anxious or depressed appearing Neuro: Alert, conversational, full strength upper and lower extremities, moderately unstable gait using a cane to walk     Assessment & Plan:   Problem List Items Addressed This Visit       High   Mood disorder - Primary (Chronic)   Long-term Xanax  use raises concerns about falls, memory loss, and dementia in older adults. Discussed risks and benefits of continuing a benzo.  The patient reports it only helps her a little bit.  But she is very resistant to making any changes.  She is resistant to my warnings about the risk of falls.  Her mood disorder is managed also with escitalopram  and lamotrigine .  She has been on a benzo for at least 5 years now, but seems pretty low risk for withdrawal.  Her use of the Xanax  has been more consistently on a daily basis over the last 2 months, and I encouraged her that now is the time to reduce to avoid complications.  We are going to reduce the prescription from 90 tablets to 45 tablets for a 1 month supply.  I am going to continue to work with her to try to find alternatives to help her sleep, perhaps she would  be open to trazodone in the future.  This is only my second meeting with her, so we are still trying to build a therapeutic relationship, hopefully I can gain enough trust in the future to make some changes to this medicine.      Relevant Medications   ALPRAZolam  (XANAX ) 0.5 MG tablet    Meds ordered this encounter  Medications   ALPRAZolam  (XANAX ) 0.5 MG tablet    Sig: Take 1 tablet (0.5 mg total) by mouth at bedtime as needed for anxiety.    Dispense:  45 tablet    Refill:  0    This is a #30 day supply    Return in about 6 weeks (around 05/08/2024).  Cleatus Debby Specking, MD [facility] at [facility]

## 2024-03-27 NOTE — Assessment & Plan Note (Signed)
 Long-term Xanax  use raises concerns about falls, memory loss, and dementia in older adults. Discussed risks and benefits of continuing a benzo.  The patient reports it only helps her a little bit.  But she is very resistant to making any changes.  She is resistant to my warnings about the risk of falls.  Her mood disorder is managed also with escitalopram  and lamotrigine .  She has been on a benzo for at least 5 years now, but seems pretty low risk for withdrawal.  Her use of the Xanax  has been more consistently on a daily basis over the last 2 months, and I encouraged her that now is the time to reduce to avoid complications.  We are going to reduce the prescription from 90 tablets to 45 tablets for a 1 month supply.  I am going to continue to work with her to try to find alternatives to help her sleep, perhaps she would be open to trazodone in the future.  This is only my second meeting with her, so we are still trying to build a therapeutic relationship, hopefully I can gain enough trust in the future to make some changes to this medicine.

## 2024-03-27 NOTE — Patient Instructions (Signed)
  VISIT SUMMARY: Today, we discussed your use of Xanax  for managing tension and insomnia, as well as your recent experiences with Ozempic for weight loss. We reviewed your current medications and made some adjustments to better support your health and well-being.  YOUR PLAN: -MOOD DISORDER: A mood disorder involves persistent feelings of sadness or periods of feeling overly happy, or fluctuations from extreme happiness to extreme sadness. You will continue taking your current medications, escitalopram  and lamotrigine , as they are effective for managing your symptoms.  -ANXIETY DISORDER WITH INSOMNIA: An anxiety disorder with insomnia means experiencing excessive worry and difficulty sleeping. Long-term use of Xanax  can increase the risk of falls, memory loss, and dementia in older adults. We discussed the possibility of switching to Klonopin, which may be a safer alternative. For now, you will continue with a reduced dosage of Xanax , with a prescription of 45 tablets for one month. We will reassess your medication use in six weeks.  INSTRUCTIONS: Please schedule a follow-up appointment in six weeks to reassess your medication use. Continue taking your current medications as prescribed and monitor any changes in your symptoms. If you experience any adverse effects or have concerns, contact our office.

## 2024-03-27 NOTE — Telephone Encounter (Signed)
 Called patient and she said that she is still taking xanax . She never tapered off. She is coming in today at 3:20pm to discuss.

## 2024-04-05 ENCOUNTER — Ambulatory Visit: Attending: Internal Medicine | Admitting: Internal Medicine

## 2024-04-05 VITALS — BP 110/66 | HR 63 | Ht 61.0 in | Wt 169.0 lb

## 2024-04-05 DIAGNOSIS — I480 Paroxysmal atrial fibrillation: Secondary | ICD-10-CM | POA: Diagnosis not present

## 2024-04-05 DIAGNOSIS — I4892 Unspecified atrial flutter: Secondary | ICD-10-CM | POA: Insufficient documentation

## 2024-04-05 DIAGNOSIS — Z01810 Encounter for preprocedural cardiovascular examination: Secondary | ICD-10-CM | POA: Diagnosis not present

## 2024-04-05 DIAGNOSIS — I10 Essential (primary) hypertension: Secondary | ICD-10-CM | POA: Diagnosis not present

## 2024-04-05 DIAGNOSIS — G4733 Obstructive sleep apnea (adult) (pediatric): Secondary | ICD-10-CM

## 2024-04-05 DIAGNOSIS — E7849 Other hyperlipidemia: Secondary | ICD-10-CM

## 2024-04-05 NOTE — Progress Notes (Signed)
 Cardiology Office Note  Date: 04/05/2024   ID: Alexandra [name], DOB December 10, 1945, MRN [id number]  PCP:  Alexandra Cleatus Ned, MD  Cardiologist:  Diannah SHAUNNA Maywood, MD Electrophysiologist:  None   Reason for Office Visit:Follow-up of paroxysmal atrial flutter   History of Present Illness: Alexandra [name] is a 78 y.o. female known to have HTN, paroxysmal atrial flutter, HLD is here for follow-up visit.  Patient was referred to cardiology clinic 02/2022 for evaluation of A-fib/atrial flutter. Patient had ER visit in 01/2022 for palpitations, EKG showed atrial flutter with RVR in the setting of right middle finger laceration and the rates were controlled with IV Cardizem .  She was discharged on rate controlling agents and systemic anticoagulation.  Upon discharge from the ER, she had 1 more episode of palpitations.  Eliquis  was switched to Coumadin  due to higher co-pay.  She is here for follow-up visit.  No interval ER visits or hospitalizations.  Has palpitations once a month, last for 10 minutes.  She also has substernal chest pressure which she thinks is from gas because it resolves after passing gas.  Otherwise denied any chest pressure with exertion.  No dizziness, DOE and syncope.  No leg swelling.  Compliant with medications and has no side effects.  Patient was also complaining chest pain the last clinic visit for which NM stress test was ordered but not performed probably due to insurance issues.  Past Medical History:  Diagnosis Date   Depression    Gout    High cholesterol    Hip fracture (HCC) 11/19/2023   Hypertension     Past Surgical History:  Procedure Laterality Date   CARPAL TUNNEL RELEASE     CHOLECYSTECTOMY     FEMUR IM NAIL Left 11/20/2023   Procedure: INSERTION, INTRAMEDULLARY ROD, FEMUR;  Surgeon: Sheril Coy, MD;  Location: [facility];  Service: Orthopedics;  Laterality: Left;   REPLACEMENT TOTAL KNEE      Current Outpatient Medications  Medication Sig  Dispense Refill   albuterol  (VENTOLIN  HFA) 108 (90 Base) MCG/ACT inhaler Inhale 1-2 puffs into the lungs every 6 (six) hours as needed for wheezing or shortness of breath. 18 g 0   ALPRAZolam  (XANAX ) 0.5 MG tablet Take 1 tablet (0.5 mg total) by mouth at bedtime as needed for anxiety. 45 tablet 0   apixaban  (ELIQUIS ) 5 MG TABS tablet Take 1 tablet (5 mg total) by mouth 2 (two) times daily. 60 tablet 5   atorvastatin  (LIPITOR) 20 MG tablet Take 20 mg by mouth daily.     Coenzyme Q10 10 MG capsule Take 10 mg by mouth daily.     diltiazem  (CARDIZEM ) 30 MG tablet Take 1 tablet (30 mg total) by mouth 4 (four) times daily as needed. 90 tablet 1   ENBREL SURECLICK 50 MG/ML injection Inject 50 mg into the skin once a week. Saturday     escitalopram  (LEXAPRO ) 20 MG tablet Take 20 mg by mouth daily.     Ferrous Sulfate (FEROSUL PO) Take 1 tablet by mouth daily.     folic acid  (FOLVITE ) 1 MG tablet Take 1 tablet (1 mg total) by mouth daily. 90 tablet 3   hydrochlorothiazide  (HYDRODIURIL ) 25 MG tablet Take 1 tablet (25 mg total) by mouth daily. 90 tablet 3   Insulin Syringe-Needle U-100 (B-D INS SYR MICROFINE 1CC/27G) 27G X 5/8 1 ML MISC once a week for MTX for 84 days     lamoTRIgine  (LAMICTAL ) 150 MG tablet Take 150 mg by  mouth daily.     methotrexate (50 MG/ML) 1 g injection Inject 20 mg into the vein once a week. SQ     metoprolol  tartrate (LOPRESSOR ) 50 MG tablet Take 1 tablet (50 mg total) by mouth 2 (two) times daily. 60 tablet 5   nitroGLYCERIN  (NITROSTAT ) 0.4 MG SL tablet Place 1 tablet (0.4 mg total) under the tongue every 5 (five) minutes x 3 doses as needed for chest pain (If no relief after 3rd dose, call 911 or go to ED.). 30 tablet 1   pantoprazole  (PROTONIX ) 40 MG tablet Take 1 tablet (40 mg total) by mouth daily. Take 30-60 min before first meal of the day 30 tablet 2   tiZANidine  (ZANAFLEX ) 4 MG tablet Take 4 mg by mouth every 8 (eight) hours as needed for muscle spasms.     No current  facility-administered medications for this visit.   Allergies:  Codeine and Naproxen   Social History: The patient  reports that she has never smoked. She has never used smokeless tobacco. She reports that she does not currently use alcohol. She reports that she does not use drugs.   Family History: The patient's family history includes Cancer in her paternal grandfather and sister; Coronary artery disease in her brother; Heart attack in her mother; Pulmonary embolism in her father; Stroke in her maternal grandmother.   ROS:  Please see the history of present illness. Otherwise, complete review of systems is positive for none.  All other systems are reviewed and negative.   Physical Exam: VS:  BP 110/66 (BP Location: Right Arm)   Pulse 63   Ht 5' 1 (1.549 m)   Wt 169 lb (76.7 kg)   SpO2 95%   BMI 31.93 kg/m , BMI Body mass index is 31.93 kg/m.  Wt Readings from Last 3 Encounters:  04/05/24 169 lb (76.7 kg)  03/27/24 166 lb 3.2 oz (75.4 kg)  02/23/24 175 lb (79.4 kg)    General: Patient appears comfortable at rest. HEENT: Conjunctiva and lids normal, oropharynx clear with moist mucosa. Neck: Supple, no elevated JVP or carotid bruits, no thyromegaly. Lungs: Clear to auscultation, nonlabored breathing at rest. Cardiac: Regular rate and rhythm, no S3 or significant systolic murmur, no pericardial rub. Abdomen: Soft, nontender, no hepatomegaly, bowel sounds present, no guarding or rebound. Extremities: No pitting edema, distal pulses 2+. Skin: Warm and dry. Musculoskeletal: No kyphosis. Neuropsychiatric: Alert and oriented x3, affect grossly appropriate.  Recent Labwork: 11/20/2023: Magnesium  2.0 11/23/2023: ALT 14; AST 23; BUN 15; Creatinine, Ser 0.92; Hemoglobin 9.6; Platelets 243; Potassium 4.3; Sodium 130  No results found for: CHOL, TRIG, HDL, CHOLHDL, VLDL, LDLCALC, LDLDIRECT   Assessment and Plan:  # Paroxysmal atrial flutter - EKG from 01/2022 showed  atrial flutter with RVR but subsequent EKGs on the EMR showed normal sinus rhythm including EKG from the clinic visit today.   - Seldom palpitations, last episode was in Feb 2025.  She has diltiazem  30 mg pill to take every 6 hours as needed for palpitations.  Continue metoprolol  at rate of 50 mg twice daily. - Continue Eliquis  5 mg twice daily.  Initially on Eliquis , switched to Coumadin  due to cost.  He can switched back to Eliquis  as currently it is free but she has to pay $300 once a year. - She was diagnosed with sleep apnea but stopped wearing CPAP due to weight fluctuations and mask not fitting well. - Echo normal.  # Preop cardiac risk stratification - Need to get  lumbar epidural injections.  Hold Eliquis  for 3 days prior to the procedure and resume after the procedure if safe from bleeding standpoint.  # History of OSA - Patient has history of OSA.  She stopped using CPAP due to fluctuations in her weight and mask not fitting well.  She is interested to know more about inspire's device.  Will refer her to sleep medicine.  # HTN, controlled - Continue HCTZ 25 mg once daily, metoprolol  tartrate 50 mg twice daily.  # HLD -Continue atorvastatin  20 mg nightly.  Goal LDL less than 100.  Follows with PCP.  Previously, Dr. Rogenia was her PCP, switched to Dr. Jerrell now after Dr. Thomasine retirement.  I spent 30 minutes in reviewing prior records, more than 3 labs, discussion and documentation.  Medication Adjustments/Labs and Tests Ordered: Current medicines are reviewed at length with the patient today.  Concerns regarding medicines are outlined above.   Tests Ordered: Orders Placed This Encounter  Procedures   EKG 12-Lead    Medication Changes: No orders of the defined types were placed in this encounter.   Disposition:  Follow up 2 years  Signed, Jevon Littlepage Arleta Maywood, MD, 04/05/2024 9:59 AM    [facility] at [facility] [address],  [place], [place] [address]

## 2024-04-05 NOTE — Patient Instructions (Signed)
 Medication Instructions:  Your physician recommends that you continue on your current medications as directed. Please refer to the Current Medication list given to you today.   Labwork: None  Testing/Procedures: None  Follow-Up: Your physician recommends that you schedule a follow-up appointment in: 2 years. You will receive a reminder call in about 18 months reminding you to schedule your appointment. If you don't receive this call, please contact our office.   Any Other Special Instructions Will Be Listed Below (If Applicable). Referral to Sleep Medicine   Thank you for choosing  [facility]!     If you need a refill on your cardiac medications before your next appointment, please call your pharmacy.

## 2024-04-10 ENCOUNTER — Ambulatory Visit

## 2024-04-10 VITALS — Ht 61.0 in | Wt 169.0 lb

## 2024-04-10 DIAGNOSIS — Z0000 Encounter for general adult medical examination without abnormal findings: Secondary | ICD-10-CM

## 2024-04-10 NOTE — Progress Notes (Signed)
 Chief Complaint  Patient presents with   Medicare Wellness     Subjective:   Alexandra [name] is a 78 y.o. female who presents for a [employer] Visit.  No voiced or noted concerns at this time Patient advised to keep follow-up appointment with PCP (05-09-2023)   Visit info / Clinical Intake: Medicare Wellness Visit Type:: Subsequent Annual Wellness Visit Persons participating in visit and providing information:: patient Medicare Wellness Visit Mode:: Telephone If telephone:: video declined Since this visit was completed virtually, some vitals may be partially provided or unavailable. Missing vitals are due to the limitations of the virtual format.: Unable to obtain vitals - no equipment If Telephone or Video please confirm:: I connected with patient using audio/video enable telemedicine. I verified patient identity with two identifiers, discussed telehealth limitations, and patient agreed to proceed. Patient Location:: home Provider Location:: home Interpreter Needed?: No Pre-visit prep was completed: no AWV questionnaire completed by patient prior to visit?: no Living arrangements:: (!) lives alone Patient's Overall Health Status Rating: good Typical amount of pain: none Does pain affect daily life?: no Are you currently prescribed opioids?: no  Dietary Habits and Nutritional Risks How many meals a day?: 2 Eats fruit and vegetables daily?: yes Most meals are obtained by: preparing own meals; eating out In the last 2 weeks, have you had any of the following?: none Diabetic:: no  Functional Status Activities of Daily Living (to include ambulation/medication): Independent Ambulation: Independent Medication Administration: Independent Home Management (perform basic housework or laundry): Independent Primary transportation is: driving Concerns about vision?: no *vision screening is required for WTM* Concerns about hearing?: no  Fall Screening Falls in the  past year?: 1 Number of falls in past year: 1 Was there an injury with Fall?: 1 Fall Risk Category Calculator: 3 Patient Fall Risk Level: High Fall Risk  Fall Risk Patient at Risk for Falls Due to: No Fall Risks Fall risk Follow up: Falls evaluation completed; Education provided; Falls prevention discussed; Follow up appointment  Home and Transportation Safety: All rugs have non-skid backing?: yes All stairs or steps have railings?: N/A, no stairs Grab bars in the bathtub or shower?: (!) no Good home lighting?: yes Regular seat belt use?: yes Hospital stays in the last year:: (!) yes How many hospital stays:: 1 Reason: hip replacement  Cognitive Assessment Difficulty concentrating, remembering, or making decisions? : no Will 6CIT or Mini Cog be Completed: yes What year is it?: 0 points What month is it?: 0 points Give patient an address phrase to remember (5 components): Its very sunny outside today in December About what time is it?: 0 points Count backwards from 20 to 1: 0 points Say the months of the year in reverse: 0 points Repeat the address phrase from earlier: 4 points 6 CIT Score: 4 points  Advance Directives (For Healthcare) Does Patient Have a Medical Advance Directive?: No Would patient like information on creating a medical advance directive?: No - Patient declined  Reviewed/Updated  Reviewed/Updated: Reviewed All (Medical, Surgical, Family, Medications, Allergies, Care Teams, Patient Goals); Surgical History; Family History; Medications; Allergies; Care Teams; Patient Goals; Medical History    Allergies (verified) Codeine and Naproxen   Current Medications (verified) Outpatient Encounter Medications as of 04/10/2024  Medication Sig   albuterol  (VENTOLIN  HFA) 108 (90 Base) MCG/ACT inhaler Inhale 1-2 puffs into the lungs every 6 (six) hours as needed for wheezing or shortness of breath.   ALPRAZolam  (XANAX ) 0.5 MG tablet Take 1 tablet (0.5 mg  total) by  mouth at bedtime as needed for anxiety.   apixaban  (ELIQUIS ) 5 MG TABS tablet Take 1 tablet (5 mg total) by mouth 2 (two) times daily.   atorvastatin  (LIPITOR) 20 MG tablet Take 20 mg by mouth daily.   Coenzyme Q10 10 MG capsule Take 10 mg by mouth daily.   diltiazem  (CARDIZEM ) 30 MG tablet Take 1 tablet (30 mg total) by mouth 4 (four) times daily as needed.   ENBREL SURECLICK 50 MG/ML injection Inject 50 mg into the skin once a week. Saturday   escitalopram  (LEXAPRO ) 20 MG tablet Take 20 mg by mouth daily.   Ferrous Sulfate (FEROSUL PO) Take 1 tablet by mouth daily.   folic acid  (FOLVITE ) 1 MG tablet Take 1 tablet (1 mg total) by mouth daily.   hydrochlorothiazide  (HYDRODIURIL ) 25 MG tablet Take 1 tablet (25 mg total) by mouth daily.   Insulin Syringe-Needle U-100 (B-D INS SYR MICROFINE 1CC/27G) 27G X 5/8 1 ML MISC once a week for MTX for 84 days   lamoTRIgine  (LAMICTAL ) 150 MG tablet Take 150 mg by mouth daily.   methotrexate (50 MG/ML) 1 g injection Inject 20 mg into the vein once a week. SQ   metoprolol  tartrate (LOPRESSOR ) 50 MG tablet Take 1 tablet (50 mg total) by mouth 2 (two) times daily.   nitroGLYCERIN  (NITROSTAT ) 0.4 MG SL tablet Place 1 tablet (0.4 mg total) under the tongue every 5 (five) minutes x 3 doses as needed for chest pain (If no relief after 3rd dose, call 911 or go to ED.).   pantoprazole  (PROTONIX ) 40 MG tablet Take 1 tablet (40 mg total) by mouth daily. Take 30-60 min before first meal of the day   tiZANidine  (ZANAFLEX ) 4 MG tablet Take 4 mg by mouth every 8 (eight) hours as needed for muscle spasms.   No facility-administered encounter medications on file as of 04/10/2024.    History: Past Medical History:  Diagnosis Date   Depression    Gout    High cholesterol    Hip fracture (HCC) 11/19/2023   Hypertension    Past Surgical History:  Procedure Laterality Date   CARPAL TUNNEL RELEASE     CHOLECYSTECTOMY     FEMUR IM NAIL Left 11/20/2023   Procedure:  INSERTION, INTRAMEDULLARY ROD, FEMUR;  Surgeon: Sheril Coy, MD;  Location: [facility];  Service: Orthopedics;  Laterality: Left;   REPLACEMENT TOTAL KNEE     Family History  Problem Relation Age of Onset   Heart attack Mother    Pulmonary embolism Father    Cancer Sister    Coronary artery disease Brother    Stroke Maternal Grandmother    Cancer Paternal Grandfather    Social History   Occupational History   Not on file  Tobacco Use   Smoking status: Never   Smokeless tobacco: Never  Vaping Use   Vaping status: Never Used  Substance and Sexual Activity   Alcohol use: Not Currently    Comment: rarely   Drug use: Never   Sexual activity: Not Currently   Tobacco Counseling Counseling given: Not Answered  SDOH Screenings   Food Insecurity: No Food Insecurity (04/10/2024)  Housing: Unknown (04/10/2024)  Transportation Needs: No Transportation Needs (04/10/2024)  Utilities: Not At Risk (04/10/2024)  Depression (PHQ2-9): Low Risk (04/10/2024)  Physical Activity: Inactive (04/10/2024)  Social Connections: Moderately Integrated (04/10/2024)  Stress: No Stress Concern Present (04/10/2024)  Tobacco Use: Low Risk (04/10/2024)  Health Literacy: Adequate Health Literacy (04/10/2024)   See  flowsheets for full screening details  Depression Screen PHQ 2 & 9 Depression Scale- Over the past 2 weeks, how often have you been bothered by any of the following problems? Little interest or pleasure in doing things: 0 Feeling down, depressed, or hopeless (PHQ Adolescent also includes...irritable): 0 PHQ-2 Total Score: 0 Trouble falling or staying asleep, or sleeping too much: 2 Feeling tired or having little energy: 2 Poor appetite or overeating (PHQ Adolescent also includes...weight loss): 0 Feeling bad about yourself - or that you are a failure or have let yourself or your family down: 0 Trouble concentrating on things, such as reading the newspaper or watching television (PHQ  Adolescent also includes...like school work): 0 Moving or speaking so slowly that other people could have noticed. Or the opposite - being so fidgety or restless that you have been moving around a lot more than usual: 0 Thoughts that you would be better off dead, or of hurting yourself in some way: 0 PHQ-9 Total Score: 4 If you checked off any problems, how difficult have these problems made it for you to do your work, take care of things at home, or get along with other people?: Not difficult at all     Goals Addressed             This Visit's Progress    Patient Stated       Maintain current lifestyle             Objective:    Today's Vitals   04/10/24 1353  Weight: 169 lb (76.7 kg)  Height: 5' 1 (1.549 m)   Body mass index is 31.93 kg/m.  Hearing/Vision screen Hearing Screening - Comments:: Does not wear hearing Some trouble back ground noise Vision Screening - Comments:: Not up to date [facility] Immunizations and Health Maintenance Health Maintenance  Topic Date Due   Hepatitis C Screening  Never done   DTaP/Tdap/Td (1 - Tdap) Never done   Pneumococcal Vaccine: 50+ Years (2 of 2 - PPSV23, PCV20, or PCV21) 03/29/2014   Influenza Vaccine  11/25/2023   Medicare Annual Wellness (AWV)  04/10/2025   Bone Density Scan  Completed   Zoster Vaccines- Shingrix  Completed   Meningococcal B Vaccine  Aged Out   Mammogram  Discontinued   COVID-19 Vaccine  Discontinued        Assessment/Plan:  This is a routine wellness examination for Chimere.  Patient Care Team: Jerrell Cleatus Ned, MD as PCP - General (Internal Medicine) Mallipeddi, Diannah SQUIBB, MD as PCP - Cardiology (Cardiology) Darlean Ozell NOVAK, MD as Consulting Physician (Pulmonary Disease)  I have personally reviewed and noted the following in the patients chart:   Medical and social history Use of alcohol, tobacco or illicit drugs  Current medications and supplements including  opioid prescriptions. Functional ability and status Nutritional status Physical activity Advanced directives List of other physicians Hospitalizations, surgeries, and ER visits in previous 12 months Vitals Screenings to include cognitive, depression, and falls Referrals and appointments  No orders of the defined types were placed in this encounter.  In addition, I have reviewed and discussed with patient certain preventive protocols, quality metrics, and best practice recommendations. A written personalized care plan for preventive services as well as general preventive health recommendations were provided to patient.   Mliss Graff, LPN   [id number]   No follow-ups on file.  After Visit Summary: (MyChart) Due to this being a telephonic visit, the after visit  summary with patients personalized plan was offered to patient via MyChart   Nurse Notes:

## 2024-04-10 NOTE — Patient Instructions (Signed)
 Alexandra [name],  Thank you for taking the time for your Medicare Wellness Visit. I appreciate your continued commitment to your health goals. Please review the care plan we discussed, and feel free to reach out if I can assist you further.  Please note that Annual Wellness Visits do not include a physical exam. Some assessments may be limited, especially if the visit was conducted virtually. If needed, we may recommend an in-person follow-up with your provider.  Ongoing Care Seeing your primary care provider every 3 to 6 months helps us  monitor your health and provide consistent, personalized care.   Referrals If a referral was made during today's visit and you haven't received any updates within two weeks, please contact the referred provider directly to check on the status.  Recommended Screenings:       04/10/2024    1:58 PM  Advanced Directives  Does Patient Have a Medical Advance Directive? No  Would patient like information on creating a medical advance directive? No - Patient declined    Vision: Annual vision screenings are recommended for early detection of glaucoma, cataracts, and diabetic retinopathy. These exams can also reveal signs of chronic conditions such as diabetes and high blood pressure.  Dental: Annual dental screenings help detect early signs of oral cancer, gum disease, and other conditions linked to overall health, including heart disease and diabetes.  Please see the attached documents for additional preventive care recommendations.    Alexandra [name] , Thank you for taking time to come for your Medicare Wellness Visit. I appreciate your ongoing commitment to your health goals. Please review the following plan we discussed and let me know if I can assist you in the future.   Screening recommendations/referrals: Colonoscopy:  Mammogram:  Bone Density:  Recommended yearly ophthalmology/optometry visit for glaucoma screening and checkup Recommended yearly dental  visit for hygiene and checkup  Vaccinations: Influenza vaccine:  Pneumococcal vaccine:  Tdap vaccine:  Shingles vaccine:     Advanced directives:   Conditions/risks identified:   Next appointment:    Preventive Care 65 Years and Older, Female Preventive care refers to lifestyle choices and visits with your health care provider that can promote health and wellness. What does preventive care include? A yearly physical exam. This is also called an annual well check. Dental exams once or twice a year. Routine eye exams. Ask your health care provider how often you should have your eyes checked. Personal lifestyle choices, including: Daily care of your teeth and gums. Regular physical activity. Eating a healthy diet. Avoiding tobacco and drug use. Limiting alcohol use. Practicing safe sex. Taking low-dose aspirin every day. Taking vitamin and mineral supplements as recommended by your health care provider. What happens during an annual well check? The services and screenings done by your health care provider during your annual well check will depend on your age, overall health, lifestyle risk factors, and family history of disease. Counseling  Your health care provider may ask you questions about your: Alcohol use. Tobacco use. Drug use. Emotional well-being. Home and relationship well-being. Sexual activity. Eating habits. History of falls. Memory and ability to understand (cognition). Work and work astronomer. Reproductive health. Screening  You may have the following tests or measurements: Height, weight, and BMI. Blood pressure. Lipid and cholesterol levels. These may be checked every 5 years, or more frequently if you are over 4 years old. Skin check. Lung cancer screening. You may have this screening every year starting at age 36 if you have a 30-pack-year  history of smoking and currently smoke or have quit within the past 15 years. Fecal occult blood test (FOBT)  of the stool. You may have this test every year starting at age 68. Flexible sigmoidoscopy or colonoscopy. You may have a sigmoidoscopy every 5 years or a colonoscopy every 10 years starting at age 47. Hepatitis C blood test. Hepatitis B blood test. Sexually transmitted disease (STD) testing. Diabetes screening. This is done by checking your blood sugar (glucose) after you have not eaten for a while (fasting). You may have this done every 1-3 years. Bone density scan. This is done to screen for osteoporosis. You may have this done starting at age 87. Mammogram. This may be done every 1-2 years. Talk to your health care provider about how often you should have regular mammograms. Talk with your health care provider about your test results, treatment options, and if necessary, the need for more tests. Vaccines  Your health care provider may recommend certain vaccines, such as: Influenza vaccine. This is recommended every year. Tetanus, diphtheria, and acellular pertussis (Tdap, Td) vaccine. You may need a Td booster every 10 years. Zoster vaccine. You may need this after age 40. Pneumococcal 13-valent conjugate (PCV13) vaccine. One dose is recommended after age 31. Pneumococcal polysaccharide (PPSV23) vaccine. One dose is recommended after age 66. Talk to your health care provider about which screenings and vaccines you need and how often you need them. This information is not intended to replace advice given to you by your health care provider. Make sure you discuss any questions you have with your health care provider. Document Released: 05/09/2015 Document Revised: 12/31/2015 Document Reviewed: 02/11/2015 Elsevier Interactive Patient Education  2017 [employer].  Fall Prevention in the Home Falls can cause injuries. They can happen to people of all ages. There are many things you can do to make your home safe and to help prevent falls. What can I do on the outside of my home? Regularly fix  the edges of walkways and driveways and fix any cracks. Remove anything that might make you trip as you walk through a door, such as a raised step or threshold. Trim any bushes or trees on the path to your home. Use bright outdoor lighting. Clear any walking paths of anything that might make someone trip, such as rocks or tools. Regularly check to see if handrails are loose or broken. Make sure that both sides of any steps have handrails. Any raised decks and porches should have guardrails on the edges. Have any leaves, snow, or ice cleared regularly. Use sand or salt on walking paths during winter. Clean up any spills in your garage right away. This includes oil or grease spills. What can I do in the bathroom? Use night lights. Install grab bars by the toilet and in the tub and shower. Do not use towel bars as grab bars. Use non-skid mats or decals in the tub or shower. If you need to sit down in the shower, use a plastic, non-slip stool. Keep the floor dry. Clean up any water  that spills on the floor as soon as it happens. Remove soap buildup in the tub or shower regularly. Attach bath mats securely with double-sided non-slip rug tape. Do not have throw rugs and other things on the floor that can make you trip. What can I do in the bedroom? Use night lights. Make sure that you have a light by your bed that is easy to reach. Do not use any sheets or  blankets that are too big for your bed. They should not hang down onto the floor. Have a firm chair that has side arms. You can use this for support while you get dressed. Do not have throw rugs and other things on the floor that can make you trip. What can I do in the kitchen? Clean up any spills right away. Avoid walking on wet floors. Keep items that you use a lot in easy-to-reach places. If you need to reach something above you, use a strong step stool that has a grab bar. Keep electrical cords out of the way. Do not use floor polish  or wax that makes floors slippery. If you must use wax, use non-skid floor wax. Do not have throw rugs and other things on the floor that can make you trip. What can I do with my stairs? Do not leave any items on the stairs. Make sure that there are handrails on both sides of the stairs and use them. Fix handrails that are broken or loose. Make sure that handrails are as long as the stairways. Check any carpeting to make sure that it is firmly attached to the stairs. Fix any carpet that is loose or worn. Avoid having throw rugs at the top or bottom of the stairs. If you do have throw rugs, attach them to the floor with carpet tape. Make sure that you have a light switch at the top of the stairs and the bottom of the stairs. If you do not have them, ask someone to add them for you. What else can I do to help prevent falls? Wear shoes that: Do not have high heels. Have rubber bottoms. Are comfortable and fit you well. Are closed at the toe. Do not wear sandals. If you use a stepladder: Make sure that it is fully opened. Do not climb a closed stepladder. Make sure that both sides of the stepladder are locked into place. Ask someone to hold it for you, if possible. Clearly mark and make sure that you can see: Any grab bars or handrails. First and last steps. Where the edge of each step is. Use tools that help you move around (mobility aids) if they are needed. These include: Canes. Walkers. Scooters. Crutches. Turn on the lights when you go into a dark area. Replace any light bulbs as soon as they burn out. Set up your furniture so you have a clear path. Avoid moving your furniture around. If any of your floors are uneven, fix them. If there are any pets around you, be aware of where they are. Review your medicines with your doctor. Some medicines can make you feel dizzy. This can increase your chance of falling. Ask your doctor what other things that you can do to help prevent  falls. This information is not intended to replace advice given to you by your health care provider. Make sure you discuss any questions you have with your health care provider. Document Released: 02/06/2009 Document Revised: 09/18/2015 Document Reviewed: 05/17/2014 Elsevier Interactive Patient Education  2017 [employer].

## 2024-04-11 NOTE — Telephone Encounter (Signed)
 Helping in preop today. Dr. Mallipeddi completed her OV with recommendations. Faxed via Epic fax function.

## 2024-04-30 ENCOUNTER — Other Ambulatory Visit: Payer: Self-pay | Admitting: Student in an Organized Health Care Education/Training Program

## 2024-05-01 NOTE — Telephone Encounter (Signed)
 Patient is requesting refill. Okay to fill under your name

## 2024-05-08 ENCOUNTER — Encounter: Payer: Self-pay | Admitting: Student in an Organized Health Care Education/Training Program

## 2024-05-08 ENCOUNTER — Ambulatory Visit: Admitting: Student in an Organized Health Care Education/Training Program

## 2024-05-08 VITALS — BP 132/49 | HR 67 | Temp 97.9°F | Ht 61.0 in | Wt 186.0 lb

## 2024-05-08 DIAGNOSIS — I480 Paroxysmal atrial fibrillation: Secondary | ICD-10-CM | POA: Diagnosis not present

## 2024-05-08 DIAGNOSIS — F333 Major depressive disorder, recurrent, severe with psychotic symptoms: Secondary | ICD-10-CM

## 2024-05-08 DIAGNOSIS — Z6835 Body mass index (BMI) 35.0-35.9, adult: Secondary | ICD-10-CM | POA: Diagnosis not present

## 2024-05-08 DIAGNOSIS — E66811 Obesity, class 1: Secondary | ICD-10-CM | POA: Diagnosis not present

## 2024-05-08 DIAGNOSIS — F39 Unspecified mood [affective] disorder: Secondary | ICD-10-CM

## 2024-05-08 DIAGNOSIS — M069 Rheumatoid arthritis, unspecified: Secondary | ICD-10-CM | POA: Diagnosis not present

## 2024-05-08 LAB — POCT GLYCOSYLATED HEMOGLOBIN (HGB A1C): HbA1c, POC (controlled diabetic range): 5.7 % (ref 0.0–7.0)

## 2024-05-08 MED ORDER — ALPRAZOLAM 0.5 MG PO TABS: 0.5000 mg | ORAL_TABLET | Freq: Every evening | ORAL | 0 refills | Status: AC | PRN

## 2024-05-08 NOTE — Patient Instructions (Signed)
" °  VISIT SUMMARY: During your visit, we discussed your weight management, anxiety, and back pain. You have gained ten pounds since your last visit and are concerned about your eating habits and the cost of Ozempic. We also talked about your anxiety, which has been worsened by recent life events, and your current medications. You are experiencing back pain due to spinal stenosis and are managing it with medications and scheduled injections.  YOUR PLAN: -MAJOR DEPRESSIVE DISORDER WITH PSYCHOTIC FEATURES: This condition involves persistent feelings of sadness and loss of interest, along with hallucinations. We will continue your current medications, lamotrigine  and escitalopram , and have referred you to a psychiatrist for further evaluation and management of your symptoms. We also discussed the potential risks of using alprazolam  and tizanidine  together.  -GENERALIZED ANXIETY DISORDER: This condition involves excessive worry and anxiety about various aspects of life. Your anxiety has been worsened by recent events. We will continue your alprazolam  0.5 mg at bedtime as needed, but we discussed the potential risks of long-term use and interactions with other medications.  -OBESITY: This condition involves having excess body fat. You have gained 10 pounds since your last visit. We checked your A1c to assess your blood sugar control and will contact your pharmacist to explore assistance programs for Ozempic coverage. We also discussed the potential future availability and cost reduction of Ozempic.  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 You are managing your back pain with tizanidine  and NSAIDs, although tizanidine  causes drowsiness. You received a scheduled back injection for pain management and will continue your current pain management regimen.  INSTRUCTIONS: Please follow up with the psychiatrist for further evaluation and  management of your depressive and anxiety symptoms. We will contact your pharmacist to explore assistance programs for Ozempic coverage. Continue your current medications and pain management regimen as discussed. If you experience any new or worsening symptoms, please contact our office.    "

## 2024-05-08 NOTE — Assessment & Plan Note (Signed)
 She has gained 10 pounds since the last visit.  She is requesting to go back onto a GLP-1 agonist, previously used Ozempic through what sounds like a medication assistance program.  At this time I am not aware of any pharmaceutical assistance program that offers GLP-1 agonists for the treatment of obesity without diabetes.  A1c today is 5.7%.  We talked about the out-of-pocket cost of this medication, I offered to prescribe to [employer] direct, but she declined, not affordable.

## 2024-05-08 NOTE — Assessment & Plan Note (Signed)
 Chronic and stable.  Pretty good exertional capacity, good functional status.  No issues with syncope, tachycardias, chest pain, doing well on anticoagulation for primary stroke prevention with apixaban .

## 2024-05-08 NOTE — Progress Notes (Signed)
 "  Established Patient Office Visit  Patient ID: Alexandra [name], female    DOB: 09-Sep-1945  Age: 79 y.o. MRN: [id number] PCP: Jerrell Cleatus Ned, MD  Chief Complaint  Patient presents with   Follow-up    Patient states she wants to get back on Ozempic. States swelling in ankles.     Subjective:     HPI  Discussed the use of AI scribe software for clinical note transcription with the patient, who gave verbal consent to proceed.  History of Present Illness Alexandra [name] is a 79 year old female who presents for weight management and anxiety issues.  She has gained ten pounds since her last visit, now weighing 186 pounds, up from 162 pounds. She attributes this to her eating habits, describing herself as having an addiction to food, particularly sweets like cakes and pies. She previously used Ozempic, which helped control her food cravings, but has not used it since July due to hip surgery. She attempted to use a 1 mg dose recently, which made her very sick for a week. She is unsure if her insurance will cover Ozempic this year and is concerned about the cost if not covered.  She has a history of anxiety and reports a very difficult year, including the loss of several close friends and family members, and her own hip fracture. She experiences anxiety and takes Xanax  at night to help with sleep, but notes that without it, she does not sleep restfully and sometimes experiences hallucinations, such as hearing voices and seeing figures. 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 She is open to seeing a psychiatrist if her children can pay for it.  She experiences back pain due to spinal stenosis and is scheduled to receive a shot for it. She takes tizanidine  for muscle relaxation but finds it makes her sleepy, so she prefers taking Tylenol  and Advil. She takes two Tylenol  and one Advil twice a day for pain management.  She is currently on lamotrigine   and escitalopram  for mood stabilization and depression, respectively. She notes that these medications have been effective in managing her symptoms, although she can feel when she needs them more. She continues to take these medications as prescribed by her previous psychiatrist.  She reports swelling in her ankles and upper leg, but no recent falls.     Objective:     BP (!) 132/49   Pulse 67   Temp 97.9 F (36.6 C) (Oral)   Ht 5' 1 (1.549 m)   Wt 186 lb (84.4 kg)   SpO2 97%   BMI 35.14 kg/m   Physical Exam  Gen: Well appearing woman Neck: Normal thyroid , no nodules or adenopathy Heart: Regular, no murmur Lungs: Unlabored, clear throughout Psych: Appropriate mood and affect, not anxious or depressed appearing, calm and cooperative, organized speech and linear thoughts.    Assessment & Plan:   Problem List Items Addressed This Visit       High   Paroxysmal atrial fibrillation (HCC) (Chronic)   Chronic and stable.  Pretty good exertional capacity, good functional status.  No issues with syncope, tachycardias, chest pain, doing well on anticoagulation for primary stroke prevention with apixaban .      Rheumatoid arthritis (HCC) (Chronic)   Chronic and stable.  Multiple pain generators but overall doing pretty well on Enbrel and methotrexate.  This is being managed with a rheumatologist.      Major depressive disorder, recurrent  episode, moderate with mood-congruent psychotic features (HCC) - Primary (Chronic)   She experiences auditory hallucinations and visual disturbances, possibly worsened by recent stressors. There are concerns about the interaction between alprazolam  and tizanidine . She is referred to a psychiatrist for medication management and evaluation of psychotic symptoms. Continue lamotrigine  and escitalopram  as prescribed.  May continue to recommend weaning off of Xanax .  We have gone from 90 tablets to 45 tablets to now 30 tablets/month.       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  She is requesting to go back onto a GLP-1 agonist, previously used Ozempic through what sounds like a medication assistance program.  At this time I am not aware of any pharmaceutical assistance program that offers GLP-1 agonists for the treatment of obesity without diabetes.  A1c today is 5.7%.  We talked about the out-of-pocket cost of this medication, I offered to prescribe to [facility] direct, but she declined, not affordable.      Relevant Orders   POCT HgB A1C   Other Visit Diagnoses       Mood disorder       Relevant Medications   ALPRAZolam  (XANAX ) 0.5 MG tablet       Return in about 2 months (around 07/06/2024).    Cleatus Debby Specking, MD [facility]   "

## 2024-05-08 NOTE — Assessment & Plan Note (Signed)
 Chronic and stable.  Multiple pain generators but overall doing pretty well on Enbrel and methotrexate.  This is being managed with a rheumatologist.

## 2024-05-08 NOTE — Assessment & Plan Note (Signed)
 She experiences auditory hallucinations and visual disturbances, possibly worsened by recent stressors. There are concerns about the interaction between alprazolam  and tizanidine . She is referred to a psychiatrist for medication management and evaluation of psychotic symptoms. Continue lamotrigine  and escitalopram  as prescribed.  May continue to recommend weaning off of Xanax .  We have gone from 90 tablets to 45 tablets to now 30 tablets/month.

## 2024-05-24 ENCOUNTER — Ambulatory Visit: Admitting: Student in an Organized Health Care Education/Training Program

## 2024-06-11 ENCOUNTER — Ambulatory Visit: Payer: Self-pay

## 2024-07-06 ENCOUNTER — Ambulatory Visit: Admitting: Student in an Organized Health Care Education/Training Program
